# Patient Record
Sex: Female | Born: 1955 | Race: Black or African American | Hispanic: No | Smoking: Former smoker
Health system: Southern US, Community
[De-identification: ages and names within clinical notes are randomized; demographics above are authoritative.]

## PROBLEM LIST (undated history)

## (undated) DIAGNOSIS — E119 Type 2 diabetes mellitus without complications: Secondary | ICD-10-CM

## (undated) DIAGNOSIS — E785 Hyperlipidemia, unspecified: Secondary | ICD-10-CM

## (undated) DIAGNOSIS — H409 Unspecified glaucoma: Secondary | ICD-10-CM

## (undated) DIAGNOSIS — F329 Major depressive disorder, single episode, unspecified: Secondary | ICD-10-CM

## (undated) DIAGNOSIS — F419 Anxiety disorder, unspecified: Secondary | ICD-10-CM

## (undated) DIAGNOSIS — T7840XA Allergy, unspecified, initial encounter: Secondary | ICD-10-CM

## (undated) DIAGNOSIS — F32A Depression, unspecified: Secondary | ICD-10-CM

## (undated) DIAGNOSIS — K219 Gastro-esophageal reflux disease without esophagitis: Secondary | ICD-10-CM

## (undated) DIAGNOSIS — I10 Essential (primary) hypertension: Secondary | ICD-10-CM

## (undated) HISTORY — PX: ABDOMINAL HYSTERECTOMY: SHX81

## (undated) HISTORY — DX: Unspecified glaucoma: H40.9

## (undated) HISTORY — DX: Depression, unspecified: F32.A

## (undated) HISTORY — DX: Anxiety disorder, unspecified: F41.9

## (undated) HISTORY — DX: Allergy, unspecified, initial encounter: T78.40XA

## (undated) HISTORY — DX: Type 2 diabetes mellitus without complications: E11.9

## (undated) HISTORY — PX: WISDOM TOOTH EXTRACTION: SHX21

## (undated) HISTORY — DX: Major depressive disorder, single episode, unspecified: F32.9

## (undated) HISTORY — DX: Gastro-esophageal reflux disease without esophagitis: K21.9

## (undated) HISTORY — DX: Hyperlipidemia, unspecified: E78.5

## (undated) HISTORY — DX: Essential (primary) hypertension: I10

---

## 1998-02-16 ENCOUNTER — Ambulatory Visit (HOSPITAL_COMMUNITY): Admission: RE | Admit: 1998-02-16 | Discharge: 1998-02-16 | Payer: Self-pay | Admitting: Obstetrics and Gynecology

## 1999-03-01 ENCOUNTER — Ambulatory Visit (HOSPITAL_COMMUNITY): Admission: RE | Admit: 1999-03-01 | Discharge: 1999-03-01 | Payer: Self-pay | Admitting: Obstetrics and Gynecology

## 1999-03-01 ENCOUNTER — Encounter: Payer: Self-pay | Admitting: Obstetrics and Gynecology

## 1999-03-16 ENCOUNTER — Encounter: Payer: Self-pay | Admitting: Obstetrics and Gynecology

## 1999-03-16 ENCOUNTER — Ambulatory Visit (HOSPITAL_COMMUNITY): Admission: RE | Admit: 1999-03-16 | Discharge: 1999-03-16 | Payer: Self-pay | Admitting: Obstetrics and Gynecology

## 1999-09-04 ENCOUNTER — Ambulatory Visit (HOSPITAL_COMMUNITY): Admission: RE | Admit: 1999-09-04 | Discharge: 1999-09-04 | Payer: Self-pay | Admitting: Obstetrics and Gynecology

## 1999-09-04 ENCOUNTER — Encounter: Payer: Self-pay | Admitting: Obstetrics and Gynecology

## 1999-09-05 ENCOUNTER — Other Ambulatory Visit: Admission: RE | Admit: 1999-09-05 | Discharge: 1999-09-05 | Payer: Self-pay | Admitting: Obstetrics and Gynecology

## 2000-04-08 ENCOUNTER — Encounter: Payer: Self-pay | Admitting: Obstetrics and Gynecology

## 2000-04-08 ENCOUNTER — Encounter: Admission: RE | Admit: 2000-04-08 | Discharge: 2000-04-08 | Payer: Self-pay | Admitting: Internal Medicine

## 2000-09-06 ENCOUNTER — Other Ambulatory Visit: Admission: RE | Admit: 2000-09-06 | Discharge: 2000-09-06 | Payer: Self-pay | Admitting: Obstetrics and Gynecology

## 2001-04-16 ENCOUNTER — Ambulatory Visit (HOSPITAL_COMMUNITY): Admission: RE | Admit: 2001-04-16 | Discharge: 2001-04-16 | Payer: Self-pay | Admitting: Obstetrics and Gynecology

## 2001-04-16 ENCOUNTER — Encounter: Payer: Self-pay | Admitting: Obstetrics and Gynecology

## 2001-09-09 ENCOUNTER — Other Ambulatory Visit: Admission: RE | Admit: 2001-09-09 | Discharge: 2001-09-09 | Payer: Self-pay | Admitting: Obstetrics and Gynecology

## 2002-04-20 ENCOUNTER — Ambulatory Visit (HOSPITAL_COMMUNITY): Admission: RE | Admit: 2002-04-20 | Discharge: 2002-04-20 | Payer: Self-pay | Admitting: Obstetrics and Gynecology

## 2002-04-20 ENCOUNTER — Encounter: Payer: Self-pay | Admitting: Obstetrics and Gynecology

## 2003-04-22 ENCOUNTER — Ambulatory Visit (HOSPITAL_COMMUNITY): Admission: RE | Admit: 2003-04-22 | Discharge: 2003-04-22 | Payer: Self-pay | Admitting: Obstetrics and Gynecology

## 2003-04-22 ENCOUNTER — Encounter: Payer: Self-pay | Admitting: Obstetrics and Gynecology

## 2004-06-19 ENCOUNTER — Ambulatory Visit (HOSPITAL_COMMUNITY): Admission: RE | Admit: 2004-06-19 | Discharge: 2004-06-19 | Payer: Self-pay | Admitting: Obstetrics and Gynecology

## 2005-06-28 ENCOUNTER — Ambulatory Visit: Payer: Self-pay | Admitting: Internal Medicine

## 2005-07-09 ENCOUNTER — Ambulatory Visit: Payer: Self-pay | Admitting: Internal Medicine

## 2005-08-24 ENCOUNTER — Ambulatory Visit (HOSPITAL_COMMUNITY): Admission: RE | Admit: 2005-08-24 | Discharge: 2005-08-24 | Payer: Self-pay | Admitting: Obstetrics and Gynecology

## 2006-09-25 ENCOUNTER — Ambulatory Visit (HOSPITAL_COMMUNITY): Admission: RE | Admit: 2006-09-25 | Discharge: 2006-09-25 | Payer: Self-pay | Admitting: Internal Medicine

## 2007-10-14 ENCOUNTER — Ambulatory Visit (HOSPITAL_COMMUNITY): Admission: RE | Admit: 2007-10-14 | Discharge: 2007-10-14 | Payer: Self-pay | Admitting: Internal Medicine

## 2008-10-15 ENCOUNTER — Ambulatory Visit (HOSPITAL_COMMUNITY): Admission: RE | Admit: 2008-10-15 | Discharge: 2008-10-15 | Payer: Self-pay | Admitting: Obstetrics and Gynecology

## 2009-05-17 ENCOUNTER — Encounter: Payer: Self-pay | Admitting: Internal Medicine

## 2009-06-29 ENCOUNTER — Encounter: Payer: Self-pay | Admitting: Internal Medicine

## 2009-07-25 ENCOUNTER — Ambulatory Visit: Payer: Self-pay | Admitting: Internal Medicine

## 2009-07-25 DIAGNOSIS — I10 Essential (primary) hypertension: Secondary | ICD-10-CM | POA: Insufficient documentation

## 2009-07-25 DIAGNOSIS — E785 Hyperlipidemia, unspecified: Secondary | ICD-10-CM | POA: Insufficient documentation

## 2009-07-25 DIAGNOSIS — F17200 Nicotine dependence, unspecified, uncomplicated: Secondary | ICD-10-CM | POA: Insufficient documentation

## 2009-08-03 ENCOUNTER — Ambulatory Visit: Payer: Self-pay

## 2009-08-03 ENCOUNTER — Encounter: Payer: Self-pay | Admitting: Internal Medicine

## 2009-08-24 ENCOUNTER — Encounter: Payer: Self-pay | Admitting: Internal Medicine

## 2009-09-15 ENCOUNTER — Ambulatory Visit: Payer: Self-pay | Admitting: Internal Medicine

## 2009-10-24 ENCOUNTER — Ambulatory Visit (HOSPITAL_COMMUNITY): Admission: RE | Admit: 2009-10-24 | Discharge: 2009-10-24 | Payer: Self-pay | Admitting: Obstetrics and Gynecology

## 2010-01-17 ENCOUNTER — Encounter (INDEPENDENT_AMBULATORY_CARE_PROVIDER_SITE_OTHER): Payer: Self-pay | Admitting: *Deleted

## 2010-03-09 ENCOUNTER — Ambulatory Visit: Payer: Self-pay | Admitting: Internal Medicine

## 2010-04-26 ENCOUNTER — Encounter: Payer: Self-pay | Admitting: Nurse Practitioner

## 2010-06-23 ENCOUNTER — Encounter: Payer: Self-pay | Admitting: Nurse Practitioner

## 2010-06-23 ENCOUNTER — Telehealth: Payer: Self-pay | Admitting: Internal Medicine

## 2010-06-27 ENCOUNTER — Ambulatory Visit: Payer: Self-pay | Admitting: Gastroenterology

## 2010-06-27 ENCOUNTER — Encounter: Payer: Self-pay | Admitting: Internal Medicine

## 2010-06-27 DIAGNOSIS — K625 Hemorrhage of anus and rectum: Secondary | ICD-10-CM | POA: Insufficient documentation

## 2010-06-27 DIAGNOSIS — K5900 Constipation, unspecified: Secondary | ICD-10-CM | POA: Insufficient documentation

## 2010-06-27 DIAGNOSIS — K219 Gastro-esophageal reflux disease without esophagitis: Secondary | ICD-10-CM | POA: Insufficient documentation

## 2010-07-04 ENCOUNTER — Ambulatory Visit: Payer: Self-pay | Admitting: Internal Medicine

## 2010-11-27 ENCOUNTER — Ambulatory Visit (HOSPITAL_COMMUNITY)
Admission: RE | Admit: 2010-11-27 | Discharge: 2010-11-27 | Payer: Self-pay | Source: Home / Self Care | Admitting: Obstetrics and Gynecology

## 2011-01-23 NOTE — Assessment & Plan Note (Signed)
Summary: ANEMIA/BLOOD IN STOOL               (DR.BRODIE PT.)       DEB...   History of Present Illness Visit Type: Initial Consult Primary GI MD: Lina Sar MD Primary Provider: [facility] Requesting Provider: Dr Georgian Co [facility] Chief Complaint: anemia/blood in stool History of Present Illness:   Patient last seen by Dr. Juanda Chance in 2003 at which time she had a colonoscopy for hematochezia. Saw Urgent Care 06/23/10 with constipation and rectal bleeding. Given Miralax and Anusol but didn't pick those up from pharmacy yet. Spontaneously improved but then Sunday had more rectal bleeding (scant amount. No rectal bleeding on Monday (yesterday) and no BMs today.  Patient's constipation developed three weeks ago. She didn't start any new medicdaiton other than Zyrtec. No changes in activity. She is trying to lose weight and has made healthy dietary changes with more frutis and vegetables. Has intentionally lost 20 pounds over last 6 months.    GI Review of Systems    Reports acid reflux and  heartburn.      Denies abdominal pain, belching, bloating, chest pain, dysphagia with liquids, dysphagia with solids, loss of appetite, nausea, vomiting, vomiting blood, weight loss, and  weight gain.      Reports constipation, hemorrhoids, and  rectal bleeding.     Denies anal fissure, black tarry stools, change in bowel habit, diarrhea, diverticulosis, fecal incontinence, heme positive stool, irritable bowel syndrome, jaundice, light color stool, liver problems, and  rectal pain. Preventive Screening-Counseling & Management      Drug Use:  no.      Current Medications (verified): 1)  Aciphex 20 Mg Tbec (Rabeprazole Sodium) .... Take 1 Tablet By Mouth Once A Day 2)  Wellbutrin Xl 150 Mg Xr24h-Tab (Bupropion Hcl) .... Take 1 Tablet By Mouth Once A Day 3)  Crestor 10 Mg Tabs (Rosuvastatin Calcium) .... Take One Tablet By Mouth Daily. 4)   Lisinopril-Hydrochlorothiazide 20-12.5 Mg Tabs (Lisinopril-Hydrochlorothiazide) .Marland Kitchen.. 1 By Mouth Once Daily 5)  Metformin Hcl 500 Mg Tabs (Metformin Hcl) .Marland Kitchen.. 1 By Mouth Once Daily  Allergies (verified): 1)  ! * Bioxin  Past History:  Past Medical History: Hypertension Dyslipidemia Anxiety Disorder Diabetes GERD  Past Surgical History: Reviewed history from 07/23/2009 and no changes required. partial hysterectomy  Family History: family history of CROHN'S  diease                                                             mat-and -pat grandparents have heart diease                                     diabetes mat-grandmother and aunt                                                     alcoholism -uncle Family History of Breast Cancer:  Social History: divorced-past smoker- occ acholol-no drugs  . 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  The patient denies allergy/sinus, anemia, arthritis/joint pain, back pain, blood in urine, breast changes/lumps, change in vision, confusion, cough, coughing up blood, depression-new, fainting, fatigue, fever, headaches-new, hearing problems, heart murmur, heart rhythm changes, itching, menstrual pain, muscle pains/cramps, night sweats, nosebleeds, pregnancy symptoms, shortness of breath, skin rash, sleeping problems, sore throat, swelling of feet/legs, swollen lymph glands, thirst - excessive , urination - excessive , urination changes/pain, urine leakage, vision changes, and voice change.    Vital Signs:  Patient profile:   55 year old female Height:      65 inches Weight:      223 pounds BMI:     37.24 BSA:     2.07 Pulse rate:   88 / minute Pulse rhythm:   regular BP sitting:   102 / 62  (left arm)  Vitals Entered By: Merri Ray CMA Duncan Dull) (June 27, 2010 2:19 PM)  Physical Exam  General:  Well developed, pleasant black  female. Head:  Normocephalic and atraumatic. Eyes:  Conjunctiva pink, no icterus.  Neck:  no obvious masses  Lungs:  Clear throughout to auscultation. Heart:  Regular rate and rhythm; no murmurs, rubs,  or bruits. Abdomen:  Abdomen soft, nontender, nondistended. No obvious masses or hepatomegaly.Normal bowel sounds.  Rectal:  No external hemorrhoids or fissures. Small amount brown stools with flecks of blood, heme positive.  Msk:  Symmetrical with no gross deformities. Normal posture. Extremities:  No palmar erythema, no edema.  Neurologic:  Alert and  oriented x4;  grossly normal neurologically. Skin:  Intact without significant lesions or rashes. Cervical Nodes:  No significant cervical adenopathy. Psych:  Alert and cooperative. Normal mood and affect.   Impression & Recommendations:  Problem # 1:  RECTAL BLEEDING (ICD-569.3) Assessment New CBC from Urgent Care on 04/26/10 was normal with hemoglobin of 14.3, MCV of 89. BMET also normal. According to the notes from Urgent Care a repeat CBC on 06/23/10 showed hemoglobin of 14.2. In setting of acute constipation, bleeding likely secondary to known history of hemorrhoids but it has been 8 1/2 years since her last colonoscopy so polyps, neoplasm are possible. The patient will be scheduled for a colonoscopy with biopsies/polypectomy (if indicated).  The risks and benefits of the procedure, as well as alternatives were discussed with the patient and she agrees to proceed. In meantime patient should start Anusol HC suppositories as prescribed by Urgent Care.   Note: patient referred for anemia and rectal bleeding. Based on records from Urgent Care I see no evidence of anemia. We have left a message at their office to call for clarification.   Orders: Colonoscopy (Colon)  Problem # 2:  GERD (ICD-530.81) Assessment: Deteriorated Longstanding GERD with heartburn and regurgitation. Takes Aciphex after breakfast and having breakthrough symptoms.  Needs to take Aciphex 30 minutes prior to breakfast. Anti-reflux measures discussed.  Continued weight loss should help. If no improvement she may need to increase PPI twice daily.   Problem # 3:  CONSTIPATION (ICD-564.00) Assessment: New Acute. Recommend starting daily Miralax given to her by Urgent Care.   Patient Instructions: 1)  We sent the prescription for the Colonoscopy prep to the pharmacy, [facility], [address]. 2)  We scheduled the Colonsocopy for 07-04-10 with Dr. Juanda Chance.  3)  Directions and brochure given. 4)  [facility] Patient Information Guide given to patient. 5)  Continue the Miralax daily. 6)  Continue the Anusol Supopositories. 7)  Copy sent to : Dr. Georgian Co 8)  The medication list was reviewed and reconciled.  All changed / newly prescribed medications were explained.  A complete medication list was provided to the patient / caregiver. Prescriptions: MOVIPREP 100 GM  SOLR (PEG-KCL-NACL-NASULF-NA ASC-C) As per prep instructions.  #1 x 0   Entered by:   Lowry Ram NCMA   Authorized by:   Willette Cluster NP   Signed by:   Lowry Ram NCMA on 06/27/2010   Method used:   Electronically to        [facility] [address] [id number]* (retail)       [address] [place], [place]  [address]       Ph: [phone]       Fax: [phone]   RxID:   [id number]   Appended Document: ANEMIA/BLOOD IN STOOL               (DR.BRODIE PT.)       DEB... Marylene Land from [facility] called me back and said that she checked with the provider, Georgian Co and she did not suspect anemia.  The pt mentioned some blood in her stool.  The pt hasn't had any new labs done since the labs they sent [name]. The HGB had not changed from 14.2 , since labs done on 04-26-10. They did not do Hemosure stool card testing with this pt.

## 2011-01-23 NOTE — Progress Notes (Signed)
Summary: Triage  Phone Note From Other Clinic   Caller: Renee @ [facility]  [phone] Call For: Dr. Juanda Chance Summary of Call: pt. has blood in her stool and is anemic. Requesting sooner appt. than next avail. Initial call taken by: Karna Christmas,  June 23, 2010 9:58 AM  Follow-up for Phone Call        Pt. will see Willette Cluster NP on 06-27-10 at 2:30pm. Luster Landsberg will advise pt. of appt/med.list/co-pay/cx.policy and she will fax records to [phone] Attn: Pam.  Follow-up by: Laureen Ochs LPN,  June 24, 4539 10:38 AM     Appended Document: Triage Called on Tues 06-27-10 and Wed 06-28-10 to have [facility] fax [name] any info that states the pt is anemic and has Hem Pos Stools.  That was why the appt was made with [name] but when that office sent [name] paperwork, there was nothing to show this.  Willette Cluster NP asked me to call that office to see if there is any other labs or notes that show this.  I was told again today that they will look into this and get back to me.

## 2011-01-23 NOTE — Miscellaneous (Signed)
Summary: Anusol Rx  Clinical Lists Changes  Medications: Added new medication of ANUSOL-HC 25 MG  SUPP (HYDROCORTISONE ACETATE) insert 1 at bedtime as needed rectal bleeding - Signed Rx of ANUSOL-HC 25 MG  SUPP (HYDROCORTISONE ACETATE) insert 1 at bedtime as needed rectal bleeding;  #12 x 0;  Signed;  Entered by: Doristine Church RN II;  Authorized by: Hart Carwin MD;  Method used: Electronically to [id number]*, [address] [place], [place]  [address], Ph: [phone], Fax: [phone] Observations: Added new observation of ALLERGY REV: Done (07/04/2010 14:17)    Prescriptions: ANUSOL-HC 25 MG  SUPP (HYDROCORTISONE ACETATE) insert 1 at bedtime as needed rectal bleeding  #12 x 0   Entered by:   Doristine Church RN II   Authorized by:   Hart Carwin MD   Signed by:   Doristine Church RN II on 07/04/2010   Method used:   Electronically to        [id number]* (retail)       [address] [place], [place]  [address]       Ph: [phone]       Fax: [phone]   RxID:   [id number]

## 2011-01-23 NOTE — Assessment & Plan Note (Signed)
Summary: 6 month rov   Visit Type:  Follow-up Primary Provider:  [facility]  CC:  no complaints.  History of Present Illness: Patient is a 55 year old with a history of hypertension and dyslipidemia.  I saw her for the first time in Aug then once again in September. Since seen, she has done well.  She denies chest pain, breathing is good.  She is working at losing wt and has gone down about 20 lbs.  Denies dizziness.  Current Medications (verified): 1)  Aciphex 20 Mg Tbec (Rabeprazole Sodium) .... Take 1 Tablet By Mouth Once A Day 2)  Wellbutrin Xl 150 Mg Xr24h-Tab (Bupropion Hcl) .... Take 1 Tablet By Mouth Once A Day 3)  Crestor 10 Mg Tabs (Rosuvastatin Calcium) .... Take One Tablet By Mouth Daily. 4)  Lisinopril-Hydrochlorothiazide 20-25 Mg Tabs (Lisinopril-Hydrochlorothiazide) .... 2 Tabs Daily  Allergies (verified): 1)  ! * Bioxin  Past History:  Past medical, surgical, family and social histories (including risk factors) reviewed, and no changes noted (except as noted below).  Past Medical History: Reviewed history from 07/25/2009 and no changes required. Hypertension Dyslipidemia  Past Surgical History: Reviewed history from 07/23/2009 and no changes required. partial hysterectomy  Family History: Reviewed history from 07/23/2009 and no changes required. family history of chrons diease                                                             mat-and -pat grandparents have heart diease                                     diabetes mat-grandmother and aunt                                                     alcoholism -uncle  Social History: Reviewed history from 07/25/2009 and no changes required. divorced-smoker- occ acholol-no drugs  .  Vital Signs:  Patient profile:   55 year old female Height:      65 inches Weight:      219 pounds BMI:     36.58 Pulse rate:   91 / minute BP sitting:   107 / 75  (left arm) Cuff size:    large  Vitals Entered By: Burnett Kanaris, CNA (March 09, 2010 2:04 PM)  Physical Exam  Additional Exam:  Patient is in NAD HEENT:  Normocephalic, atraumatic. EOMI, PERRLA.  Neck: JVP is normal. No thyromegaly. No bruits.  Lungs: clear to auscultation. No rales no wheezes.  Heart: Regular rate and rhythm. Normal S1, S2. No S3.   No significant murmurs. PMI not displaced.  Abdomen:  Supple, nontender. Normal bowel sounds. No masses. No hepatomegaly.  Extremities:   Good distal pulses throughout. No lower extremity edema.  Musculoskeletal :moving all extremities.  Neuro:   alert and oriented x3.    Impression & Recommendations:  Problem # 1:  ESSENTIAL HYPERTENSION, BENIGN (ICD-401.1) Excellent control.  I would keep her on the same regimen. Her updated medication list for this problem includes:  Lisinopril-hydrochlorothiazide 20-25 Mg Tabs (Lisinopril-hydrochlorothiazide) .Marland Kitchen... 2 tabs daily  Orders: EKG w/ Interpretation (93000)  Problem # 2:  HYPERLIPIDEMIA-MIXED (ICD-272.4) Keep on same regimen.  Will get records from Dr. Quay Burow office re lipids. Congratulated on wt loss. Her updated medication list for this problem includes:    Crestor 10 Mg Tabs (Rosuvastatin calcium) .Marland Kitchen... Take one tablet by mouth daily.  Patient Instructions: 1)  We will see you back on an as needed basis.  Appended Document: 6 month rov 12 EKG:  NSR.  91 bpm.

## 2011-01-23 NOTE — Procedures (Signed)
Summary: Colonoscopy  Patient: Cheryl Morton Note: All result statuses are Final unless otherwise noted.  Tests: (1) Colonoscopy (COL)   COL Colonoscopy           DONE     [facility]     [address]     [place], [place]  [address]           COLONOSCOPY PROCEDURE REPORT           PATIENT:  Cheryl Morton, Cheryl Morton  MR#:  [id number]     BIRTHDATE:  Jan 15, 1956, 54 yrs. old  GENDER:  female     ENDOSCOPIST:  Hedwig Morton. Juanda Chance, MD     REF. BY:  Georgian Co, PA     PROCEDURE DATE:  07/04/2010     PROCEDURE:  Colonoscopy 19147     ASA CLASS:  Class I     INDICATIONS:  hematochezia last colonoscopy 2003 int hemorrhoids     MEDICATIONS:   Versed 10 mg, Fentanyl 100 mcg           DESCRIPTION OF PROCEDURE:   After the risks benefits and     alternatives of the procedure were thoroughly explained, informed     consent was obtained.  Digital rectal exam was performed and     revealed no rectal masses.   The LB [id number]-[id number] endoscope     was introduced through the anus and advanced to the cecum, which     was identified by both the appendix and ileocecal valve, without     limitations.  The quality of the prep was excellent, using     MoviPrep.  The instrument was then slowly withdrawn as the colon     was fully examined.     <<PROCEDUREIMAGES>>           FINDINGS:  Internal and external hemorrhoids were found (see     image7 and image6).  This was otherwise a normal examination of     the colon (see image5, image4, image3, image2, and image1).     Retroflexion was not performed.  The scope was then withdrawn from     the patient and the procedure completed.           COMPLICATIONS:  None     ENDOSCOPIC IMPRESSION:     1) Internal and external hemorrhoids     2) Otherwise normal examination     3) Normal colonoscopy     RECOMMENDATIONS:     1) high fiber diet     Anusol HC supp, #12, Insert 1 qhs prn rectal bleeding     REPEAT EXAM:  In 10 year(s) for.        ______________________________     Hedwig Morton. Juanda Chance, MD           CC:           n.     eSIGNED:   Hedwig Morton. Smaran Gaus at 07/04/2010 02:11 PM           Laverle, Pillard, [phone]  Note: An exclamation mark (!) indicates a result that was not dispersed into the flowsheet. Document Creation Date: 07/04/2010 2:12 PM _______________________________________________________________________  (1) Order result status: Final Collection or observation date-time: 07/04/2010 14:04 Requested date-time:  Receipt date-time:  Reported date-time:  Referring Physician:   Ordering Physician: Lina Sar [phone]) Specimen Source:  Source: Launa Grill Order Number: [id number] Lab site:

## 2011-01-23 NOTE — Letter (Signed)
Summary: Diabetic Instructions  [facility]  [address] [place], [place] [address]   Phone: [phone]  Fax: [phone]    Cheryl Morton [birth year] MRN: [id number]   X   ORAL DIABETIC MEDICATION INSTRUCTIONS  The day before your procedure:   Take your diabetic pill as you do normally  The day of your procedure:   Do not take your diabetic pill    We will check your blood sugar levels during the admission process and again in Recovery before discharging you home  ________________________________________________________________________  _  _   INSULIN (LONG ACTING) MEDICATION INSTRUCTIONS (Lantus, NPH, 70/30, Humulin, Novolin-N)   The day before your procedure:   Take  your regular evening dose    The day of your procedure:   Do not take your morning dose    _  _   INSULIN (SHORT ACTING) MEDICATION INSTRUCTIONS (Regular, Humulog, Novolog)   The day before your procedure:   Do not take your evening dose   The day of your procedure:   Do not take your morning dose   _  _   INSULIN PUMP MEDICATION INSTRUCTIONS  We will contact the physician managing your diabetic care for written dosage instructions for the day before your procedure and the day of your procedure.  Once we have received the instructions, we will contact you.

## 2011-01-23 NOTE — Procedures (Signed)
Summary: Colonoscopy   Procedures Next Due Date:    Colonoscopy: 06/2020 

## 2011-01-23 NOTE — Letter (Signed)
Summary: [facility]  [facility]   Imported By: Sherian Rein 06/30/2010 13:19:57  _____________________________________________________________________  External Attachment:    Type:   Image     Comment:   External Document

## 2011-01-23 NOTE — Letter (Signed)
Summary: [facility] Instructions  [facility]  [address] [place], [place] [address]   Phone: [phone]  Fax: [phone]       Cheryl Morton    12-17-1956    MRN: [id number]        Procedure Day /Date:07-04-10     Arrival Time:12:30 PM     Procedure Time:1:30 PM     Location of Procedure:                    X      [facility] (4th Floor)  PREPARATION FOR COLONOSCOPY WITH MOVIPREP   Starting 5 days prior to your procedure 06-29-10 do not eat nuts, seeds, popcorn, corn, beans, peas,  salads, or any raw vegetables.  Do not take any fiber supplements (e.g. Metamucil, Citrucel, and Benefiber).  THE DAY BEFORE YOUR PROCEDURE         DATE: 07-03-10  ONG:EXBMWU  1.  Drink clear liquids the entire day-NO SOLID FOOD  2.  Do not drink anything colored red or purple.  Avoid juices with pulp.  No orange juice.  3.  Drink at least 64 oz. (8 glasses) of fluid/clear liquids during the day to prevent dehydration and help the prep work efficiently.  CLEAR LIQUIDS INCLUDE: Water Jello Ice Popsicles Tea (sugar ok, no milk/cream) Powdered fruit flavored drinks Coffee (sugar ok, no milk/cream) Gatorade Juice: apple, white grape, white cranberry  Lemonade Clear bullion, consomm, broth Carbonated beverages (any kind) Strained chicken noodle soup Hard Candy                             4.  In the morning, mix first dose of MoviPrep solution:    Empty 1 Pouch A and 1 Pouch B into the disposable container    Add lukewarm drinking water to the top line of the container. Mix to dissolve    Refrigerate (mixed solution should be used within 24 hrs)  5.  Begin drinking the prep at 5:00 p.m. The MoviPrep container is divided by 4 marks.   Every 15 minutes drink the solution down to the next mark (approximately 8 oz) until the full liter is complete.   6.  Follow completed prep with 16 oz of clear liquid of your choice (Nothing red or purple).  Continue to drink clear  liquids until bedtime.  7.  Before going to bed, mix second dose of MoviPrep solution:    Empty 1 Pouch A and 1 Pouch B into the disposable container    Add lukewarm drinking water to the top line of the container. Mix to dissolve    Refrigerate  THE DAY OF YOUR PROCEDURE      DATE: 07-04-10 DAY: Tuesday  Beginning at 8:30 AM  (5 hours before procedure):         1. Every 15 minutes, drink the solution down to the next mark (approx 8 oz) until the full liter is complete.  2. Follow completed prep with 16 oz. of clear liquid of your choice.    3. You may drink clear liquids until 11;30 AM  (2 HOURS BEFORE PROCEDURE).   MEDICATION INSTRUCTIONS  Unless otherwise instructed, you should take regular prescription medications with a small sip of water   as early as possible the morning of your procedure.  Diabetic patients - see separate instructions.        OTHER INSTRUCTIONS  You will need  a responsible adult at least 54 years of age to accompany you and drive you home.   This person must remain in the waiting room during your procedure.  Wear loose fitting clothing that is easily removed.  Leave jewelry and other valuables at home.  However, you may wish to bring a book to read or  an iPod/MP3 player to listen to music as you wait for your procedure to start.  Remove all body piercing jewelry and leave at home.  Total time from sign-in until discharge is approximately 2-3 hours.  You should go home directly after your procedure and rest.  You can resume normal activities the  day after your procedure.  The day of your procedure you should not:   Drive   Make legal decisions   Operate machinery   Drink alcohol   Return to work  You will receive specific instructions about eating, activities and medications before you leave.    The above instructions have been reviewed and explained to me by   _______________________    I fully understand and can verbalize  these instructions _____________________________ Date _________

## 2011-01-23 NOTE — Letter (Signed)
Summary: Appointment - Reminder 2  [facility], Main Office  [address]   [place], [place] [address]   Phone: [phone]  Fax: [phone]     January 17, 2010 MRN: [id number]   Cheryl Morton [address] [place], [place]  [address]   Dear Ms. Hinton,  Our records indicate that it is time to schedule a follow-up appointment with Dr. Tenny Craw. It is very important that we reach you to schedule this appointment. We look forward to participating in your health care needs. Please contact us at the number listed above at your earliest convenience to schedule your appointment.  If you are unable to make an appointment at this time, give [name] a call so we can update our records.   Sincerely,    Migdalia Dk [facility] Scheduling Team

## 2011-11-28 ENCOUNTER — Encounter (INDEPENDENT_AMBULATORY_CARE_PROVIDER_SITE_OTHER): Payer: BC Managed Care – PPO | Admitting: Physician Assistant

## 2011-11-28 DIAGNOSIS — Z01419 Encounter for gynecological examination (general) (routine) without abnormal findings: Secondary | ICD-10-CM

## 2011-11-28 DIAGNOSIS — Z0000 Encounter for general adult medical examination without abnormal findings: Secondary | ICD-10-CM

## 2011-12-13 ENCOUNTER — Other Ambulatory Visit: Payer: Self-pay | Admitting: Physician Assistant

## 2011-12-13 DIAGNOSIS — Z1231 Encounter for screening mammogram for malignant neoplasm of breast: Secondary | ICD-10-CM

## 2012-01-15 ENCOUNTER — Ambulatory Visit (HOSPITAL_COMMUNITY)
Admission: RE | Admit: 2012-01-15 | Discharge: 2012-01-15 | Disposition: A | Discharge status: Home / Self Care | Payer: BC Managed Care – PPO | Source: Ambulatory Visit | Attending: Physician Assistant | Admitting: Physician Assistant

## 2012-01-15 DIAGNOSIS — Z1231 Encounter for screening mammogram for malignant neoplasm of breast: Secondary | ICD-10-CM

## 2012-02-04 ENCOUNTER — Ambulatory Visit (INDEPENDENT_AMBULATORY_CARE_PROVIDER_SITE_OTHER): Payer: BC Managed Care – PPO

## 2012-03-13 ENCOUNTER — Other Ambulatory Visit: Payer: Self-pay | Admitting: Family Medicine

## 2012-03-13 MED ORDER — ONETOUCH FINEPOINT LANCETS MISC: Status: DC

## 2012-05-07 ENCOUNTER — Ambulatory Visit: Payer: BC Managed Care – PPO | Admitting: Physician Assistant

## 2012-05-14 ENCOUNTER — Ambulatory Visit (INDEPENDENT_AMBULATORY_CARE_PROVIDER_SITE_OTHER): Payer: BC Managed Care – PPO | Admitting: Physician Assistant

## 2012-05-14 ENCOUNTER — Encounter: Payer: Self-pay | Admitting: Physician Assistant

## 2012-05-14 VITALS — BP 122/85 | HR 89 | Temp 98.1°F | Resp 16 | Ht 66.5 in | Wt 221.4 lb

## 2012-05-14 DIAGNOSIS — I10 Essential (primary) hypertension: Secondary | ICD-10-CM

## 2012-05-14 DIAGNOSIS — R4584 Anhedonia: Secondary | ICD-10-CM

## 2012-05-14 DIAGNOSIS — E119 Type 2 diabetes mellitus without complications: Secondary | ICD-10-CM

## 2012-05-14 DIAGNOSIS — E782 Mixed hyperlipidemia: Secondary | ICD-10-CM

## 2012-05-14 LAB — COMPREHENSIVE METABOLIC PANEL
ALT: 41 U/L — ABNORMAL HIGH (ref 0–35)
BUN: 17 mg/dL (ref 6–23)
Calcium: 9.9 mg/dL (ref 8.4–10.5)
Creat: 0.91 mg/dL (ref 0.50–1.10)
Glucose, Bld: 109 mg/dL — ABNORMAL HIGH (ref 70–99)
Potassium: 4.5 mEq/L (ref 3.5–5.3)
Sodium: 136 mEq/L (ref 135–145)
Total Protein: 7.1 g/dL (ref 6.0–8.3)

## 2012-05-14 LAB — LIPID PANEL
Cholesterol: 142 mg/dL (ref 0–200)
Total CHOL/HDL Ratio: 3 Ratio
VLDL: 26 mg/dL (ref 0–40)

## 2012-05-14 MED ORDER — LISINOPRIL 20 MG PO TABS: 20.0000 mg | ORAL_TABLET | Freq: Every day | ORAL | Status: DC

## 2012-05-14 MED ORDER — ALPRAZOLAM 0.5 MG PO TABS: 0.5000 mg | ORAL_TABLET | Freq: Two times a day (BID) | ORAL | Status: DC

## 2012-05-14 MED ORDER — BUPROPION HCL ER (XL) 300 MG PO TB24: 300.0000 mg | ORAL_TABLET | Freq: Every day | ORAL | Status: DC

## 2012-05-14 MED ORDER — METFORMIN HCL 500 MG PO TABS: 500.0000 mg | ORAL_TABLET | Freq: Two times a day (BID) | ORAL | Status: DC

## 2012-05-14 MED ORDER — FLUTICASONE FUROATE 27.5 MCG/SPRAY NA SUSP: 2.0000 | Freq: Every day | NASAL | Status: DC

## 2012-05-14 MED ORDER — RABEPRAZOLE SODIUM 20 MG PO TBEC: 20.0000 mg | DELAYED_RELEASE_TABLET | Freq: Every day | ORAL | Status: DC

## 2012-05-14 MED ORDER — ROSUVASTATIN CALCIUM 10 MG PO TABS: 10.0000 mg | ORAL_TABLET | Freq: Every day | ORAL | Status: DC

## 2012-05-14 NOTE — Progress Notes (Signed)
  Subjective:    Patient ID: Cheryl Morton, female    DOB: 17-Jun-1956, 56 y.o.   MRN: [id number]  HPI 56 yo AAF here for check up on DM, htn, increased cholesterol, and anxiety/depression.  She ate fruit this morning b/c she wasn't thinking about having bloodwork done this morning.  Her FBG have been averaging ~112.  She admits to having a hard time lately over the last few months w/ binging in the evenings and doesn't want to get out of bed on the weekends.  She doesn't know of anything specifically she might be depressed about.  She is lacking motivation.  Doesn't want to exercise.  She does attend a [employer] and is involved in planning events.  She feels supported by her church family.  No SI/HI  Review of Systems  All other systems reviewed and are negative.       Objective:   Physical Exam  Nursing note and vitals reviewed. Constitutional: She is oriented to person, place, and time. She appears well-developed and well-nourished.  HENT:  Head: Normocephalic and atraumatic.  Cardiovascular: Normal rate, regular rhythm and normal heart sounds.   Pulmonary/Chest: Effort normal and breath sounds normal.  Neurological: She is alert and oriented to person, place, and time.  Skin: Skin is warm and dry.        Assessment & Plan:  Anhedonia and binge eating-gave several resources which she expresses an openness to.  OA, [employer] (nutrition and exercise counseling) as well as massage,etc.  Encouraged self-care vs self-indulgence htn-stable Diabetes-checking labs.  Advised [facility] Diet plan Hyperlipidemia-checking labs. Anxiety/Depression-meds ok (usu 1/2 xanax daily), work on lifestyle modification as discussed and encouraged.

## 2012-05-24 ENCOUNTER — Other Ambulatory Visit: Payer: Self-pay | Admitting: Physician Assistant

## 2012-06-03 ENCOUNTER — Ambulatory Visit (INDEPENDENT_AMBULATORY_CARE_PROVIDER_SITE_OTHER): Payer: BC Managed Care – PPO | Admitting: Family Medicine

## 2012-06-03 VITALS — BP 115/77 | HR 111 | Temp 98.2°F | Resp 16 | Ht 66.0 in | Wt 218.0 lb

## 2012-06-03 DIAGNOSIS — B029 Zoster without complications: Secondary | ICD-10-CM

## 2012-06-03 MED ORDER — VALACYCLOVIR HCL 1 G PO TABS: 1000.0000 mg | ORAL_TABLET | Freq: Two times a day (BID) | ORAL | Status: AC

## 2012-06-03 NOTE — Progress Notes (Signed)
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 This began approximately 4 days ago. He rash is painful and pox-like.  Objective: Pox-like rash left forearm-clusters of vesicles-and similar rash in the superior border of the left scapula.  Assessment: Shingles without secondary infection  Plan: Valtrex I hand wrote out a prescription for Vicodin 5/500 #15 to be taken at night every 6 hours when necessary Patient Instructions  Shingles Shingles is caused by the same virus that causes chickenpox (varicella zoster virus or VZV). Shingles often occurs many years or decades after having chickenpox. That is why it is more common in adults older than 50 years. The virus reactivates and breaks out as an infection in a nerve root. SYMPTOMS   The initial feeling (sensations) may be pain. This pain is usually described as:   Burning.   Stabbing.   Throbbing.   Tingling in the nerve root.   A red rash will follow in a couple days. The rash may occur in any area of the body and is usually on one side (unilateral) of the body in a band or belt-like pattern. The rash usually starts out as very small blisters (vesicles). They will dry up after 7 to 10 days. This is not usually a significant problem except for the pain it causes.   Long-lasting (chronic) pain is more likely in an elderly person. It can last months to years. This condition is called postherpetic neuralgia.  Shingles can be an extremely severe infection in someone with AIDS, a weakened immune system, or with forms of leukemia. It can also be severe if you are taking transplant medicines or other medicines that weaken the immune system. TREATMENT  Your caregiver will often treat you with:  Antiviral drugs.   Anti-inflammatory drugs.   Pain medicines.  Bed rest is very important in preventing the pain associated with herpes zoster (postherpetic neuralgia). Application of  heat in the form of a hot water bottle or electric heating pad or gentle pressure with the hand is recommended to help with the pain or discomfort. PREVENTION  A varicella zoster vaccine is available to help protect against the virus. The Food and Drug Administration approved the varicella zoster vaccine for individuals 3 years of age and older. HOME CARE INSTRUCTIONS   Cool compresses to the area of rash may be helpful.   Only take over-the-counter or prescription medicines for pain, discomfort, or fever as directed by your caregiver.   Avoid contact with:   Babies.   Pregnant women.   Children with eczema.   Elderly people with transplants.   People with chronic illnesses, such as leukemia and AIDS.   If the area involved is on your face, you may receive a referral for follow-up to a specialist. It is very important to keep all follow-up appointments. This will help avoid eye complications, chronic pain, or disability.  SEEK IMMEDIATE MEDICAL CARE IF:   You develop any pain (headache) in the area of the face or eye. This must be followed carefully by your caregiver or ophthalmologist. An infection in part of your eye (cornea) can be very serious. It could lead to blindness.   You do not have pain relief from prescribed medicines.   Your redness or swelling spreads.   The area involved becomes very swollen and painful.   You have a fever.   You notice any red or painful lines extending away from the  affected area toward your heart (lymphangitis).   Your condition is worsening or has changed.  Document Released: 12/10/2005 Document Revised: 11/29/2011 Document Reviewed: 11/14/2009 [facility] Patient Information 2012 [place], [place].

## 2012-06-03 NOTE — Patient Instructions (Signed)

## 2012-06-21 ENCOUNTER — Other Ambulatory Visit: Payer: Self-pay | Admitting: Physician Assistant

## 2012-07-16 ENCOUNTER — Encounter: Payer: Self-pay | Admitting: Physician Assistant

## 2012-07-16 ENCOUNTER — Ambulatory Visit (INDEPENDENT_AMBULATORY_CARE_PROVIDER_SITE_OTHER): Payer: BC Managed Care – PPO | Admitting: Physician Assistant

## 2012-07-16 VITALS — BP 136/104 | HR 86 | Temp 98.1°F | Resp 16 | Ht 65.5 in | Wt 218.0 lb

## 2012-07-16 DIAGNOSIS — R4584 Anhedonia: Secondary | ICD-10-CM

## 2012-07-16 DIAGNOSIS — R6889 Other general symptoms and signs: Secondary | ICD-10-CM

## 2012-07-16 NOTE — Progress Notes (Signed)
  Subjective:    Patient ID: Cheryl Morton, female    DOB: 1956/06/11, 56 y.o.   MRN: [id number]  HPI here for a recheck on the anhedonia/depression we discussed at last OV.  She has been forcing herself to spend more time with friends and to exercise.  She has also gotten more involved in her church.  She is beginning to come out of the depression she had been going through.  The progress is slow, but it is getting better.    She works at [employer] center for [employer] and [employer] are currently being held in [employer]!  She hasn't taken her blood pressure medicine this morning.  Review of Systems not done     Objective:   Physical Exam no exam      Assessment & Plan:  Anhedonia/depression-improving.  Keep up the good work.  Consider OA for support. Take BP meds!  See me in September for DM, htn, hypercholesterolemia, sooner if needed.  Please fill any needed meds prior to that visit if she needs them.

## 2012-07-21 ENCOUNTER — Other Ambulatory Visit: Payer: Self-pay | Admitting: Physician Assistant

## 2012-09-24 ENCOUNTER — Encounter: Payer: Self-pay | Admitting: Physician Assistant

## 2012-09-24 ENCOUNTER — Ambulatory Visit (INDEPENDENT_AMBULATORY_CARE_PROVIDER_SITE_OTHER): Payer: BC Managed Care – PPO | Admitting: Physician Assistant

## 2012-09-24 VITALS — BP 124/76 | HR 79 | Temp 97.6°F | Resp 16 | Ht 65.5 in | Wt 219.0 lb

## 2012-09-24 DIAGNOSIS — E785 Hyperlipidemia, unspecified: Secondary | ICD-10-CM

## 2012-09-24 DIAGNOSIS — E669 Obesity, unspecified: Secondary | ICD-10-CM

## 2012-09-24 DIAGNOSIS — E119 Type 2 diabetes mellitus without complications: Secondary | ICD-10-CM

## 2012-09-24 DIAGNOSIS — I10 Essential (primary) hypertension: Secondary | ICD-10-CM

## 2012-09-24 LAB — COMPREHENSIVE METABOLIC PANEL
ALT: 43 U/L — ABNORMAL HIGH (ref 0–35)
AST: 26 U/L (ref 0–37)
Albumin: 4 g/dL (ref 3.5–5.2)
Alkaline Phosphatase: 122 U/L — ABNORMAL HIGH (ref 39–117)
BUN: 16 mg/dL (ref 6–23)
Potassium: 4.3 mEq/L (ref 3.5–5.3)
Sodium: 137 mEq/L (ref 135–145)
Total Protein: 6.7 g/dL (ref 6.0–8.3)

## 2012-09-24 LAB — POCT GLYCOSYLATED HEMOGLOBIN (HGB A1C): Hemoglobin A1C: 6.4

## 2012-09-24 LAB — GLUCOSE, POCT (MANUAL RESULT ENTRY): POC Glucose: 102 mg/dl — AB (ref 70–99)

## 2012-09-24 LAB — LIPID PANEL
HDL: 43 mg/dL (ref >39)
LDL Cholesterol: 49 mg/dL (ref 0–99)

## 2012-09-24 MED ORDER — LISINOPRIL-HYDROCHLOROTHIAZIDE 20-12.5 MG PO TABS: 2.0000 | ORAL_TABLET | Freq: Every day | ORAL | Status: DC

## 2012-09-24 MED ORDER — FLUTICASONE PROPIONATE 50 MCG/ACT NA SUSP: 2.0000 | Freq: Every day | NASAL | Status: DC

## 2012-09-24 MED ORDER — HYDROCORTISONE ACETATE 25 MG RE SUPP: 25.0000 mg | Freq: Two times a day (BID) | RECTAL | Status: DC

## 2012-09-24 MED ORDER — METFORMIN HCL 500 MG PO TABS: 500.0000 mg | ORAL_TABLET | Freq: Two times a day (BID) | ORAL | Status: DC

## 2012-09-24 MED ORDER — LISINOPRIL 20 MG PO TABS: 20.0000 mg | ORAL_TABLET | Freq: Every day | ORAL | Status: DC

## 2012-09-24 MED ORDER — ALPRAZOLAM 0.5 MG PO TABS: 0.5000 mg | ORAL_TABLET | Freq: Two times a day (BID) | ORAL | Status: DC

## 2012-09-24 NOTE — Progress Notes (Signed)
  Subjective:    Patient ID: Cheryl Morton, female    DOB: 02-Nov-1956, 56 y.o.   MRN: [id number]  HPI [name] is here today for a recheck on DM, htn, and hyperlipidemia.  She had experienced some depression and anhedonia earlier this year, but this has subsided and she is feeling back to normal.  Review of Systems  All other systems reviewed and are negative.       Objective:   Physical Exam  Nursing note and vitals reviewed. Constitutional: She is oriented to person, place, and time. She appears well-developed and well-nourished.  HENT:  Head: Normocephalic and atraumatic.  Cardiovascular: Normal rate, regular rhythm and normal heart sounds.   Pulmonary/Chest: Effort normal and breath sounds normal.  Neurological: She is alert and oriented to person, place, and time.  Skin: Skin is warm and dry.  Psychiatric: She has a normal mood and affect. Her behavior is normal. Judgment and thought content normal.    Results for orders placed in visit on 09/24/12  GLUCOSE, POCT (MANUAL RESULT ENTRY)      Component Value Range   POC Glucose 102 (*) 70 - 99 mg/dl  POCT GLYCOSYLATED HEMOGLOBIN (HGB A1C)      Component Value Range   Hemoglobin A1C 6.4            Assessment & Plan:  DM-suboptimal control.  We discussed this at length.  She agrees to resume exercising in 1 week and to work harder on healthier diet.  She really wants to avoid having to increase the doses on her meds.  Hypertension-controlled.  Continue current meds. Hyperlipidemia-checking labs. Patient is reliably consistent with proper use on her xanax and she can have refills every 6 weeks for the next 6 months.  She will see me again in 3-4 months.

## 2012-09-25 LAB — MICROALBUMIN, URINE: Microalb, Ur: 0.74 mg/dL (ref 0.00–1.89)

## 2012-11-26 ENCOUNTER — Other Ambulatory Visit: Payer: Self-pay | Admitting: Physician Assistant

## 2012-11-27 NOTE — Telephone Encounter (Signed)
Please pull chart.

## 2012-11-27 NOTE — Telephone Encounter (Signed)
CHART PULLED AT NURSES STATION PA POOL [id number]

## 2012-11-28 ENCOUNTER — Other Ambulatory Visit: Payer: Self-pay | Admitting: Radiology

## 2012-12-27 ENCOUNTER — Other Ambulatory Visit: Payer: Self-pay | Admitting: Physician Assistant

## 2012-12-28 ENCOUNTER — Other Ambulatory Visit: Payer: Self-pay | Admitting: Physician Assistant

## 2012-12-31 ENCOUNTER — Other Ambulatory Visit: Payer: Self-pay | Admitting: Physician Assistant

## 2013-01-14 ENCOUNTER — Ambulatory Visit: Payer: BC Managed Care – PPO | Admitting: Physician Assistant

## 2013-01-21 ENCOUNTER — Ambulatory Visit: Payer: BC Managed Care – PPO | Admitting: Physician Assistant

## 2013-01-23 ENCOUNTER — Other Ambulatory Visit: Payer: Self-pay | Admitting: Physician Assistant

## 2013-01-25 ENCOUNTER — Other Ambulatory Visit: Payer: Self-pay | Admitting: *Deleted

## 2013-01-25 MED ORDER — ROSUVASTATIN CALCIUM 10 MG PO TABS: 10.0000 mg | ORAL_TABLET | Freq: Every day | ORAL | Status: DC

## 2013-01-26 ENCOUNTER — Telehealth: Payer: Self-pay

## 2013-01-26 NOTE — Telephone Encounter (Signed)
LMOM at H and cell #s to CB. I am trying to do a prior auth for Crestor 10 mg which pt has been on since 2008. Ask pt if she has ever been on a different chol med.  Pt called right back and reported that she has never been Rxd anything else for chol.  Completed form and faxed to [facility].

## 2013-01-29 NOTE — Telephone Encounter (Signed)
Received approval from 01/26/13 - 10/22/15 and faxed notice to pharmacy. LMOM to notify pt.

## 2013-02-02 ENCOUNTER — Other Ambulatory Visit: Payer: Self-pay | Admitting: Physician Assistant

## 2013-02-11 ENCOUNTER — Encounter: Payer: Self-pay | Admitting: Physician Assistant

## 2013-02-11 ENCOUNTER — Ambulatory Visit (INDEPENDENT_AMBULATORY_CARE_PROVIDER_SITE_OTHER): Payer: BC Managed Care – PPO | Admitting: Physician Assistant

## 2013-02-11 VITALS — BP 118/86 | HR 88 | Temp 98.6°F | Resp 16 | Ht 65.75 in | Wt 222.0 lb

## 2013-02-11 DIAGNOSIS — E1165 Type 2 diabetes mellitus with hyperglycemia: Secondary | ICD-10-CM

## 2013-02-11 DIAGNOSIS — E7800 Pure hypercholesterolemia, unspecified: Secondary | ICD-10-CM

## 2013-02-11 DIAGNOSIS — I10 Essential (primary) hypertension: Secondary | ICD-10-CM

## 2013-02-11 DIAGNOSIS — F411 Generalized anxiety disorder: Secondary | ICD-10-CM

## 2013-02-11 LAB — COMPREHENSIVE METABOLIC PANEL
ALT: 32 U/L (ref 0–35)
Albumin: 4.5 g/dL (ref 3.5–5.2)
Alkaline Phosphatase: 124 U/L — ABNORMAL HIGH (ref 39–117)
CO2: 29 mEq/L (ref 19–32)
Glucose, Bld: 92 mg/dL (ref 70–99)
Potassium: 4.1 mEq/L (ref 3.5–5.3)
Sodium: 136 mEq/L (ref 135–145)
Total Protein: 7 g/dL (ref 6.0–8.3)

## 2013-02-11 LAB — LIPID PANEL
LDL Cholesterol: 80 mg/dL (ref 0–99)
Triglycerides: 142 mg/dL (ref <150)

## 2013-02-11 LAB — POCT GLYCOSYLATED HEMOGLOBIN (HGB A1C): Hemoglobin A1C: 6.4

## 2013-02-11 MED ORDER — METFORMIN HCL 500 MG PO TABS: 500.0000 mg | ORAL_TABLET | Freq: Two times a day (BID) | ORAL | Status: DC

## 2013-02-11 MED ORDER — RABEPRAZOLE SODIUM 20 MG PO TBEC: DELAYED_RELEASE_TABLET | ORAL | Status: DC

## 2013-02-11 MED ORDER — GLUCOSE BLOOD VI STRP: ORAL_STRIP | Status: DC

## 2013-02-11 MED ORDER — ONETOUCH ULTRASOFT LANCETS MISC: Status: DC

## 2013-02-11 MED ORDER — LISINOPRIL 20 MG PO TABS: 20.0000 mg | ORAL_TABLET | Freq: Every day | ORAL | Status: DC

## 2013-02-11 MED ORDER — ALPRAZOLAM 0.5 MG PO TABS: ORAL_TABLET | ORAL | Status: DC

## 2013-02-11 MED ORDER — BUPROPION HCL ER (XL) 300 MG PO TB24: 300.0000 mg | ORAL_TABLET | ORAL | Status: DC

## 2013-02-11 MED ORDER — LISINOPRIL-HYDROCHLOROTHIAZIDE 20-12.5 MG PO TABS: 2.0000 | ORAL_TABLET | Freq: Every day | ORAL | Status: DC

## 2013-02-11 MED ORDER — ROSUVASTATIN CALCIUM 10 MG PO TABS: 10.0000 mg | ORAL_TABLET | Freq: Every day | ORAL | Status: DC

## 2013-02-11 MED ORDER — HYDROCORTISONE ACETATE 25 MG RE SUPP: 25.0000 mg | Freq: Two times a day (BID) | RECTAL | Status: DC

## 2013-02-13 NOTE — Progress Notes (Signed)
Subjective:    Patient ID: Cheryl Morton, female    DOB: 1955-12-30, 57 y.o.   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  She is doing well and is stable/compliant with her medications. Occasional blood on TP with BMs.  This is usu only after travelling or schedule/diet changes with work.  She is UTD on colonoscopy <2 yrs ago and normal.  The suppositories have worked well when this occurs.  It is not often.  Review of Systems  All other systems reviewed and are negative.       Objective:   Physical Exam  Nursing note and vitals reviewed. Constitutional: She is oriented to person, place, and time. She appears well-developed and well-nourished.  HENT:  Head: Normocephalic and atraumatic.  Neck: Normal range of motion. Neck supple.  Cardiovascular: Normal rate, regular rhythm and normal heart sounds.   Pulmonary/Chest: Effort normal and breath sounds normal.  Neurological: She is alert and oriented to person, place, and time.  Skin: Skin is warm and dry.  Psychiatric: She has a normal mood and affect. Her behavior is normal.    Results for orders placed in visit on 02/11/13  COMPREHENSIVE METABOLIC PANEL      Result Value Range   Sodium 136  135 - 145 mEq/L   Potassium 4.1  3.5 - 5.3 mEq/L   Chloride 100  96 - 112 mEq/L   CO2 29  19 - 32 mEq/L   Glucose, Bld 92  70 - 99 mg/dL   BUN 15  6 - 23 mg/dL   Creat 1.47  8.29 - 5.62 mg/dL   Total Bilirubin 0.5  0.3 - 1.2 mg/dL   Alkaline Phosphatase 124 (*) 39 - 117 U/L   AST 21  0 - 37 U/L   ALT 32  0 - 35 U/L   Total Protein 7.0  6.0 - 8.3 g/dL   Albumin 4.5  3.5 - 5.2 g/dL   Calcium 13.0  8.4 - 86.5 mg/dL  LIPID PANEL      Result Value Range   Cholesterol 155  0 - 200 mg/dL   Triglycerides 784  <696 mg/dL   HDL 47  >29 mg/dL   Total CHOL/HDL Ratio 3.3     VLDL 28  0 - 40 mg/dL   LDL Cholesterol 80  0 - 99 mg/dL  POCT GLYCOSYLATED HEMOGLOBIN (HGB A1C)       Result Value Range   Hemoglobin A1C 6.4           Assessment & Plan:  DM-suboptimal control with goal A1C of <6.0.  She will increase dietary changes/exercise/weight loss efforts. htn-stable Hyperlipidemia  Anxiety All stable.  Continue current meds.  See me in 6 month.  Ok to fill xanax in 3 months. See me in 6 months. Meds ordered this encounter  Medications  . ALPRAZolam (XANAX) 0.5 MG tablet    Sig: TAKE 1 TABLET BY MOUTH TWICE DAILY    Dispense:  180 tablet    Refill:  0    Order Specific Question:  Supervising Provider    Answer:  DOOLITTLE, ROBERT P [[id number]]  . buPROPion (WELLBUTRIN XL) 300 MG 24 hr tablet    Sig: Take 1 tablet (300 mg total) by mouth every morning.    Dispense:  90 tablet    Refill:  2    Order Specific Question:  Supervising Provider    Answer:  DOOLITTLE, ROBERT P [[id number]]  . hydrocortisone (ANUCORT-HC) 25 MG suppository    Sig: Place 1 suppository (25 mg total) rectally 2 (two) times daily.    Dispense:  24 suppository    Refill:  3    Order Specific Question:  Supervising Provider    Answer:  DOOLITTLE, ROBERT P [[id number]]  . rosuvastatin (CRESTOR) 10 MG tablet    Sig: Take 1 tablet (10 mg total) by mouth daily. Need office visit    Dispense:  30 tablet    Refill:  0    Order Specific Question:  Supervising Provider    Answer:  DOOLITTLE, ROBERT P [[id number]]  . RABEprazole (ACIPHEX) 20 MG tablet    Sig: Take 2 tabs daily    Dispense:  180 tablet    Refill:  3    Order Specific Question:  Supervising Provider    Answer:  DOOLITTLE, ROBERT P [[id number]]  . glucose blood (ONE TOUCH ULTRA TEST) test strip    Sig: USE AS DIRECTED    Dispense:  100 each    Refill:  0    Order Specific Question:  Supervising Provider    Answer:  DOOLITTLE, ROBERT P [[id number]]  . metFORMIN (GLUCOPHAGE) 500 MG tablet    Sig: Take 1 tablet (500 mg total) by mouth 2 (two) times daily with a meal.    Dispense:  180 tablet    Refill:  1    Order Specific Question:  Supervising  Provider    Answer:  DOOLITTLE, ROBERT P [[id number]]  . lisinopril-hydrochlorothiazide (PRINZIDE,ZESTORETIC) 20-12.5 MG per tablet    Sig: Take 2 tablets by mouth daily.    Dispense:  90 tablet    Refill:  1    Order Specific Question:  Supervising Provider    Answer:  DOOLITTLE, ROBERT P [[id number]]  . lisinopril (PRINIVIL,ZESTRIL) 20 MG tablet    Sig: Take 1 tablet (20 mg total) by mouth daily.    Dispense:  90 tablet    Refill:  2    Order Specific Question:  Supervising Provider    Answer:  DOOLITTLE, ROBERT P [[id number]]  . Lancets (ONETOUCH ULTRASOFT) lancets    Sig: USE AS DIRECTED THREE TIMES PER WEEK    Dispense:  100 each    Refill:  0    Order Specific Question:  Supervising Provider    Answer:  DOOLITTLE, ROBERT P [[id number]]

## 2013-03-19 ENCOUNTER — Telehealth: Payer: Self-pay

## 2013-03-19 MED ORDER — LISINOPRIL-HYDROCHLOROTHIAZIDE 20-12.5 MG PO TABS: 2.0000 | ORAL_TABLET | Freq: Every day | ORAL | Status: DC

## 2013-03-19 NOTE — Telephone Encounter (Signed)
This is corrected, incorrect Rx taken out of list. Cheryl Morton

## 2013-03-19 NOTE — Telephone Encounter (Signed)
Both are on her drugs list .  Please send in a 6 month supply of which ever one she has been taking.

## 2013-03-19 NOTE — Telephone Encounter (Signed)
PT PICKED UP RX AND IT WAS FOR LISINOPRIL 20MG . TAKE 1 TAB PO QD.   SHE NORMALLY TAKES LISINOPRIL-HCTZ 20/12.5  2 TABS PO QD. PLEASE CLARIFY WHAT SHE SHOULD BE TAKING. (SHE DID NOT THINK THAT THE RX SHOULD BE CHANGED.    THANKS  [facility] ON [address]

## 2013-03-20 ENCOUNTER — Other Ambulatory Visit: Payer: Self-pay | Admitting: Physician Assistant

## 2013-03-23 ENCOUNTER — Other Ambulatory Visit: Payer: Self-pay | Admitting: Physician Assistant

## 2013-03-23 DIAGNOSIS — Z1231 Encounter for screening mammogram for malignant neoplasm of breast: Secondary | ICD-10-CM

## 2013-03-30 ENCOUNTER — Ambulatory Visit (HOSPITAL_COMMUNITY)
Admission: RE | Admit: 2013-03-30 | Discharge: 2013-03-30 | Disposition: A | Discharge status: Home / Self Care | Payer: BC Managed Care – PPO | Source: Ambulatory Visit | Attending: Physician Assistant | Admitting: Physician Assistant

## 2013-03-30 DIAGNOSIS — Z1231 Encounter for screening mammogram for malignant neoplasm of breast: Secondary | ICD-10-CM

## 2013-04-11 ENCOUNTER — Other Ambulatory Visit: Payer: Self-pay | Admitting: Physician Assistant

## 2013-07-08 ENCOUNTER — Other Ambulatory Visit: Payer: Self-pay | Admitting: Physician Assistant

## 2013-07-22 ENCOUNTER — Other Ambulatory Visit: Payer: Self-pay | Admitting: Physician Assistant

## 2013-08-06 ENCOUNTER — Other Ambulatory Visit: Payer: Self-pay | Admitting: Physician Assistant

## 2013-08-12 ENCOUNTER — Ambulatory Visit: Payer: BC Managed Care – PPO | Admitting: Physician Assistant

## 2013-08-18 ENCOUNTER — Ambulatory Visit (INDEPENDENT_AMBULATORY_CARE_PROVIDER_SITE_OTHER): Payer: BC Managed Care – PPO | Admitting: Family Medicine

## 2013-08-18 ENCOUNTER — Encounter: Payer: Self-pay | Admitting: Family Medicine

## 2013-08-18 VITALS — BP 116/74 | HR 71 | Temp 98.7°F | Resp 16 | Ht 65.5 in | Wt 222.0 lb

## 2013-08-18 DIAGNOSIS — E6609 Other obesity due to excess calories: Secondary | ICD-10-CM | POA: Insufficient documentation

## 2013-08-18 DIAGNOSIS — E669 Obesity, unspecified: Secondary | ICD-10-CM

## 2013-08-18 DIAGNOSIS — I10 Essential (primary) hypertension: Secondary | ICD-10-CM

## 2013-08-18 DIAGNOSIS — Z6834 Body mass index (BMI) 34.0-34.9, adult: Secondary | ICD-10-CM | POA: Insufficient documentation

## 2013-08-18 DIAGNOSIS — E119 Type 2 diabetes mellitus without complications: Secondary | ICD-10-CM

## 2013-08-18 DIAGNOSIS — K219 Gastro-esophageal reflux disease without esophagitis: Secondary | ICD-10-CM

## 2013-08-18 LAB — POCT GLYCOSYLATED HEMOGLOBIN (HGB A1C): Hemoglobin A1C: 6.3

## 2013-08-18 MED ORDER — ROSUVASTATIN CALCIUM 10 MG PO TABS: 10.0000 mg | ORAL_TABLET | Freq: Every day | ORAL | Status: DC

## 2013-08-18 MED ORDER — RABEPRAZOLE SODIUM 20 MG PO TBEC: DELAYED_RELEASE_TABLET | ORAL | Status: DC

## 2013-08-18 MED ORDER — BUPROPION HCL ER (XL) 300 MG PO TB24: ORAL_TABLET | ORAL | Status: DC

## 2013-08-18 MED ORDER — METFORMIN HCL 500 MG PO TABS: ORAL_TABLET | ORAL | Status: DC

## 2013-08-18 MED ORDER — ALPRAZOLAM 0.5 MG PO TABS: 0.5000 mg | ORAL_TABLET | Freq: Two times a day (BID) | ORAL | Status: DC | PRN

## 2013-08-18 NOTE — Progress Notes (Signed)
S:  This 57 y.o. AA female is here for follow-up : Type II DM, HTN, GERD, and medication refills. She has a job that involves a lot of travel and [employer]. She takes her work-out clothing w/ her but rarely exercises. FSBS= 106-125. She denies hypoglycemia, fatigue, diaphoresis, abnormal weight changes, vision disturbances, CP or tightness, palpitations, SOB or DOE, cough, GI upset, myalgias, HA, dizziness, numbness, weakness or syncope.  HCM- CRS was performed ~ 2 years ago and pt reports she had to have a repeat colonoscopy due to persistent blood in stools. CRS revealed hemorrhoids; she has problems w/ chronic constipation. She takes PPI but generic is not effective; she requests Brand AcipHex.  Patient Active Problem List   Diagnosis Date Noted  . Obesity, unspecified 08/18/2013  . Diabetes mellitus 09/24/2012  . GERD 06/27/2010  . CONSTIPATION 06/27/2010  . HYPERLIPIDEMIA-MIXED 07/25/2009  . TOBACCO ABUSE 07/25/2009  . ESSENTIAL HYPERTENSION, BENIGN 07/25/2009   PMHx, Soc hx and Fam Hx reviewed. Medications reconciled.  ROS: As per HPI. Otherwise noncontributory.  O: Filed Vitals:   08/18/13 1321  BP: 116/74  Pulse: 71  Temp: 98.7 F (37.1 C)  Resp: 16   GEN: In NAD; WN,WD. HENT: [place]/AT; EOMI w/ clear conj/sclerae. Otherwise unremarkable. COR: RRR. Normal S1, S2. PMI nondisplaced. No m/g/r. LUNGS: Normal  resp rate and effort. SKIN: W&D; intact w/o rashes or lesions. MS: MAEs; no c/c/e. NEURO: A&O x 3; CNs intact. Nonfocal.  Results for orders placed in visit on 08/18/13  POCT GLYCOSYLATED HEMOGLOBIN (HGB A1C)      Result Value Range   Hemoglobin A1C 6.3      A/P: Type II or unspecified type diabetes mellitus without mention of complication, not stated as uncontrolled - No medication changes; encouraged increased physical activity. Plan: POCT glycosylated hemoglobin (Hb A1C)  Essential hypertension, benign- Stable and controlled; no medication changes.  GERD-  Brand AcipHex ordered.  Obesity, unspecified- Advised reduced caloric intake; increase physical activity by starting w/ 10-15 minutes of activity daily. Pt agrees.

## 2013-08-18 NOTE — Patient Instructions (Addendum)

## 2013-09-17 ENCOUNTER — Other Ambulatory Visit: Payer: Self-pay | Admitting: Physician Assistant

## 2013-09-24 ENCOUNTER — Ambulatory Visit (INDEPENDENT_AMBULATORY_CARE_PROVIDER_SITE_OTHER): Payer: BC Managed Care – PPO | Admitting: Physician Assistant

## 2013-09-24 VITALS — BP 102/72 | HR 100 | Temp 98.7°F | Resp 20 | Ht 65.5 in | Wt 225.2 lb

## 2013-09-24 DIAGNOSIS — J329 Chronic sinusitis, unspecified: Secondary | ICD-10-CM

## 2013-09-24 MED ORDER — FLUCONAZOLE 150 MG PO TABS: 150.0000 mg | ORAL_TABLET | Freq: Once | ORAL | Status: DC

## 2013-09-24 MED ORDER — AMOXICILLIN-POT CLAVULANATE 875-125 MG PO TABS: 1.0000 | ORAL_TABLET | Freq: Two times a day (BID) | ORAL | Status: DC

## 2013-09-24 MED ORDER — IPRATROPIUM BROMIDE 0.03 % NA SOLN: 2.0000 | Freq: Two times a day (BID) | NASAL | Status: DC

## 2013-09-24 MED ORDER — GUAIFENESIN ER 1200 MG PO TB12: 1.0000 | ORAL_TABLET | Freq: Two times a day (BID) | ORAL | Status: DC | PRN

## 2013-09-24 NOTE — Patient Instructions (Addendum)
Drink plenty of fluids and get some rest. Take antibiotic (augmentin) for sinus infection. Use Atrovent nasal spray and Mucinex to help with sinus pain and pressure.  IF you develop symptoms of a vaginal yeast infection, fill the prescription for fluconazole (Diflucan), which will be on file for you at the pharmacy.

## 2013-09-24 NOTE — Progress Notes (Signed)
I have examined this patient along with the student and agree.  

## 2013-09-24 NOTE — Progress Notes (Signed)
  Subjective:    Patient ID: Cheryl Morton, female    DOB: 05/31/1956, 57 y.o.   MRN: [id number]  HPI  Cheryl Morton is 57 year old female who is here today for sinus and gum pain, ear pain, and congestion. Her symptoms started about a week ago with sinus pain and pressure. She has had bad headaches as well. Her gums are also tender and swollen. A few days ago, she developed left ear pain but no ear drainage or loss of hearing. Her symptoms are getting progressively worse despite using ibuprofen, sudafed, and Allegra. She endorses nasal congestion, some post-nasal drip, and fatigue. She denies fever, chills, cough, sore throat, chest tightness, or SOB. No nausea or vomiting, constipation, or diarrhea.   Review of Systems As above    Objective:   Physical Exam  Constitutional: She is oriented to person, place, and time. She appears well-developed and well-nourished.  HENT:  Head: Normocephalic and atraumatic.  Right Ear: Hearing, tympanic membrane, external ear and ear canal normal.  Left Ear: Hearing, tympanic membrane and ear canal normal. There is tenderness (some tenderness over tragus). No drainage or swelling. No foreign bodies.  Nose: Rhinorrhea and sinus tenderness (over bridge of nose) present. Right sinus exhibits maxillary sinus tenderness and frontal sinus tenderness. Left sinus exhibits maxillary sinus tenderness and frontal sinus tenderness.  Mouth/Throat: Uvula is midline and mucous membranes are normal. Posterior oropharyngeal erythema present. No oropharyngeal exudate, posterior oropharyngeal edema or tonsillar abscesses.  Eyes: Conjunctivae are normal. Right eye exhibits no discharge. Left eye exhibits no discharge.  Cardiovascular: Normal rate, regular rhythm and normal heart sounds.   Pulmonary/Chest: Effort normal and breath sounds normal.  Lymphadenopathy:    She has no cervical adenopathy.  Neurological: She is alert and oriented to person, place, and time.  Skin: Skin is  warm and dry. She is not diaphoretic.  Psychiatric: She has a normal mood and affect.     BP 102/72  Pulse 100  Temp(Src) 98.7 F (37.1 C) (Oral)  Resp 20  Ht 5' 5.5" (1.664 m)  Wt 225 lb 3.2 oz (102.15 kg)  BMI 36.89 kg/m2  SpO2 98%     Assessment & Plan:   Sinusitis - Plan: ipratropium (ATROVENT) 0.03 % nasal spray, Guaifenesin (MUCINEX MAXIMUM STRENGTH) 1200 MG TB12, amoxicillin-clavulanate (AUGMENTIN) 875-125 MG per tablet, fluconazole (DIFLUCAN) 150 MG tablet  Drink plenty of fluids and get some rest. Take Augmentin for the sinus infection. Use Atrovent and Mucinex Max Strength to relieve sinus pressure and congestion. If you develop yeast infection symptoms (vaginal discharge, itching), call pharmacy to fill Diflucan. If symptoms do not resolve, come back to see [name].

## 2013-10-29 ENCOUNTER — Other Ambulatory Visit: Payer: Self-pay

## 2013-10-29 ENCOUNTER — Encounter: Payer: Self-pay | Admitting: *Deleted

## 2014-01-25 ENCOUNTER — Other Ambulatory Visit: Payer: Self-pay | Admitting: Family Medicine

## 2014-01-27 NOTE — Telephone Encounter (Signed)
Alprazolam refill phoned to pt's pharmacy. 

## 2014-02-17 ENCOUNTER — Ambulatory Visit (INDEPENDENT_AMBULATORY_CARE_PROVIDER_SITE_OTHER): Payer: BC Managed Care – PPO | Admitting: Family Medicine

## 2014-02-17 ENCOUNTER — Encounter: Payer: Self-pay | Admitting: Family Medicine

## 2014-02-17 VITALS — BP 119/83 | HR 78 | Temp 98.6°F | Resp 16 | Ht 65.5 in | Wt 219.0 lb

## 2014-02-17 DIAGNOSIS — Z23 Encounter for immunization: Secondary | ICD-10-CM

## 2014-02-17 DIAGNOSIS — Z0000 Encounter for general adult medical examination without abnormal findings: Secondary | ICD-10-CM

## 2014-02-17 DIAGNOSIS — E119 Type 2 diabetes mellitus without complications: Secondary | ICD-10-CM

## 2014-02-17 DIAGNOSIS — I10 Essential (primary) hypertension: Secondary | ICD-10-CM

## 2014-02-17 DIAGNOSIS — E785 Hyperlipidemia, unspecified: Secondary | ICD-10-CM

## 2014-02-17 LAB — POCT URINALYSIS DIPSTICK
Bilirubin, UA: NEGATIVE
GLUCOSE UA: NEGATIVE
KETONES UA: NEGATIVE
LEUKOCYTES UA: NEGATIVE
Nitrite, UA: NEGATIVE
Protein, UA: NEGATIVE
SPEC GRAV UA: 1.02
Urobilinogen, UA: 0.2
pH, UA: 5.5

## 2014-02-17 LAB — COMPLETE METABOLIC PANEL WITH GFR
ALK PHOS: 97 U/L (ref 39–117)
ALT: 21 U/L (ref 0–35)
AST: 16 U/L (ref 0–37)
Albumin: 4.3 g/dL (ref 3.5–5.2)
BILIRUBIN TOTAL: 0.5 mg/dL (ref 0.2–1.2)
BUN: 18 mg/dL (ref 6–23)
CO2: 26 meq/L (ref 19–32)
CREATININE: 0.85 mg/dL (ref 0.50–1.10)
Calcium: 9.8 mg/dL (ref 8.4–10.5)
Chloride: 102 mEq/L (ref 96–112)
GFR, EST AFRICAN AMERICAN: 89 mL/min
GFR, EST NON AFRICAN AMERICAN: 77 mL/min
GLUCOSE: 116 mg/dL — AB (ref 70–99)
Potassium: 4.1 mEq/L (ref 3.5–5.3)
SODIUM: 138 meq/L (ref 135–145)
TOTAL PROTEIN: 6.7 g/dL (ref 6.0–8.3)

## 2014-02-17 LAB — LIPID PANEL
CHOL/HDL RATIO: 3.7 ratio
Cholesterol: 160 mg/dL (ref 0–200)
HDL: 43 mg/dL (ref >39)
LDL Cholesterol: 94 mg/dL (ref 0–99)
TRIGLYCERIDES: 113 mg/dL (ref <150)
VLDL: 23 mg/dL (ref 0–40)

## 2014-02-17 LAB — IFOBT (OCCULT BLOOD): IMMUNOLOGICAL FECAL OCCULT BLOOD TEST: NEGATIVE

## 2014-02-17 LAB — T3, FREE: T3, Free: 3.2 pg/mL (ref 2.3–4.2)

## 2014-02-17 LAB — TSH: TSH: 0.898 u[IU]/mL (ref 0.350–4.500)

## 2014-02-17 LAB — POCT GLYCOSYLATED HEMOGLOBIN (HGB A1C): Hemoglobin A1C: 6.4

## 2014-02-17 LAB — HEPATITIS C ANTIBODY: HCV AB: NEGATIVE

## 2014-02-17 LAB — T4, FREE: FREE T4: 1.3 ng/dL (ref 0.80–1.80)

## 2014-02-17 MED ORDER — LISINOPRIL-HYDROCHLOROTHIAZIDE 20-12.5 MG PO TABS: ORAL_TABLET | ORAL | Status: DC

## 2014-02-17 NOTE — Progress Notes (Signed)
Subjective:    Patient ID: Cheryl Morton, female    DOB: October 24, 1956, 58 y.o.   MRN: [id number]  HPI  This 58 y.o. AA female is here for CPE; PAP not indicated since pt is s/p TAH for benign reason. She has intact ovaries but no complaint of pelvic pain. She has Type II DM and is owrking on lifestyle changes. FSBS= 80-150 w/o hypoglycemic episodes.  Pt is complaint w/ all medications, including Alprazolam taken prn anxiety. No medication side effects w/ Bupropion.   Pt recently treated at [facility] for URI; strep test negative. Pt has no lingering or unresolved symptoms except those associated w/ Allergic Rhinitis.  HCM: Vision- annually.            MMG- Current.            CRS- Current; pt reports last one 5 years ago.            IMM- Current.  Patient Active Problem List   Diagnosis Date Noted  . Obesity, unspecified 08/18/2013  . Diabetes mellitus 09/24/2012  . GERD 06/27/2010  . HYPERLIPIDEMIA-MIXED 07/25/2009  . TOBACCO ABUSE 07/25/2009  . ESSENTIAL HYPERTENSION, BENIGN 07/25/2009   Prior to Admission medications   Medication Sig Start Date End Date Taking? Authorizing Provider  ALPRAZolam Duanne Moron) 0.5 MG tablet TAKE 1 TABLET BY MOUTH TWICE DAILY AS NEEDED 01/25/14  Yes Barton Fanny, MD  aspirin 81 MG tablet Take 81 mg by mouth daily.   Yes Historical Provider, MD  buPROPion (WELLBUTRIN XL) 300 MG 24 hr tablet TAKE 1 TABLET BY MOUTH EVERY DAY 08/18/13  Yes Barton Fanny, MD  fluticasone [facility]) 50 MCG/ACT nasal spray Place 2 sprays into the nose daily. 09/24/12  Yes Argentina Donovan, PA-C  glucose blood (ONE TOUCH ULTRA TEST) test strip USE AS DIRECTED 02/11/13  Yes Dionne Bucy McClung, PA-C  Guaifenesin [facility] MAXIMUM STRENGTH) 1200 MG TB12 Take 1 tablet (1,200 mg total) by mouth every 12 (twelve) hours as needed. 09/24/13  Yes Chelle S Jeffery, PA-C  hydrocortisone (ANUCORT-HC) 25 MG suppository Place 1 suppository (25 mg total) rectally 2 (two) times daily. 02/11/13   Yes Dionne Bucy McClung, PA-C  ipratropium (ATROVENT) 0.03 % nasal spray Place 2 sprays into the nose 2 (two) times daily. 09/24/13  Yes Chelle S Jeffery, PA-C  Lancets (ONETOUCH ULTRASOFT) lancets USE AS DIRECTED THREE TIMES PER WEEK 02/11/13  Yes Dionne Bucy McClung, PA-C  lisinopril-hydrochlorothiazide (PRINZIDE,ZESTORETIC) 20-12.5 MG per tablet TAKE 2 TABLETS BY MOUTH DAILY   Yes Barton Fanny, MD  metFORMIN (GLUCOPHAGE) 500 MG tablet TAKE 1 TABLET BY MOUTH TWICE DAILY WITH A MEAL 08/18/13  Yes Barton Fanny, MD  RABEprazole (ACIPHEX) 20 MG tablet Take 2 tablets by mouth daily. 08/18/13  Yes Barton Fanny, MD  rosuvastatin (CRESTOR) 10 MG tablet Take 1 tablet (10 mg total) by mouth daily. 08/18/13  Yes Barton Fanny, MD  Travoprost, BAK Free, (TRAVATAN) 0.004 % SOLN ophthalmic solution 1 drop at bedtime. NEED REFILLS   Yes Historical Provider, MD   PMHx, Surg Hx, Soc and Fam Hx reviewed.   Review of Systems  HENT: Positive for rhinorrhea.   Gastrointestinal: Positive for anal bleeding.  Allergic/Immunologic: Positive for environmental allergies.  Psychiatric/Behavioral: Positive for agitation. The patient is nervous/anxious.   All other systems reviewed and are negative.      Objective:   Physical Exam  Nursing note and vitals reviewed. Constitutional: She is oriented to person,  place, and time. Vital signs are normal. She appears well-developed and well-nourished. No distress.  HENT:  Head: Normocephalic and atraumatic.  Right Ear: Hearing, tympanic membrane, external ear and ear canal normal.  Left Ear: Hearing, tympanic membrane, external ear and ear canal normal.  Nose: Nose normal. No rhinorrhea, nasal deformity or septal deviation.  Mouth/Throat: Uvula is midline, oropharynx is clear and moist and mucous membranes are normal. No oral lesions. Normal dentition. No dental caries.  Eyes: EOM and lids are normal. Pupils are equal, round, and reactive to light. Right  conjunctiva is injected. Left conjunctiva is injected. No scleral icterus.  Fundoscopic exam:      The right eye shows no arteriolar narrowing, no AV nicking, no hemorrhage and no papilledema. The right eye shows red reflex.       The left eye shows no arteriolar narrowing, no AV nicking, no hemorrhage and no papilledema. The left eye shows red reflex.  Neck: Trachea normal, normal range of motion and full passive range of motion without pain. Neck supple. No JVD present. No spinous process tenderness and no muscular tenderness present. Carotid bruit is not present. No mass and no thyromegaly present.  Cardiovascular: Normal rate, regular rhythm, S1 normal, S2 normal and normal heart sounds.   No extrasystoles are present. PMI is not displaced.  Exam reveals no gallop and no friction rub.   No murmur heard. Pulses:      Carotid pulses are 1+ on the right side, and 1+ on the left side.      Radial pulses are 1+ on the right side, and 1+ on the left side.       Femoral pulses are 1+ on the right side, and 1+ on the left side.      Dorsalis pedis pulses are 1+ on the right side, and 1+ on the left side.       Posterior tibial pulses are 1+ on the right side, and 1+ on the left side.  Pulmonary/Chest: Effort normal and breath sounds normal. No respiratory distress. Right breast exhibits no inverted nipple, no mass, no nipple discharge, no skin change and no tenderness. Left breast exhibits no inverted nipple, no mass, no nipple discharge, no skin change and no tenderness. Breasts are symmetrical.  Abdominal: Soft. Normal appearance and bowel sounds are normal. She exhibits no distension, no abdominal bruit, no pulsatile midline mass and no mass. There is no hepatosplenomegaly. There is no tenderness. There is no guarding and no CVA tenderness.  Genitourinary: Rectum normal. Rectal exam shows no external hemorrhoid, no fissure, no mass, no tenderness and anal tone normal. Guaiac negative stool.  NEFG;  PAP/ pelvic deferred.  Musculoskeletal: Normal range of motion. She exhibits no edema and no tenderness.  Lymphadenopathy:       Head (right side): No submental, no submandibular, no tonsillar, no posterior auricular and no occipital adenopathy present.       Head (left side): No submental, no submandibular, no tonsillar, no posterior auricular and no occipital adenopathy present.    She has no cervical adenopathy.    She has no axillary adenopathy.       Right: No inguinal and no supraclavicular adenopathy present.       Left: No inguinal and no supraclavicular adenopathy present.  Neurological: She is alert and oriented to person, place, and time. She has normal strength and normal reflexes. She displays no atrophy. No cranial nerve deficit or sensory deficit. She exhibits normal muscle tone. Coordination and  gait normal.  Skin: Skin is warm, dry and intact. No ecchymosis, no petechiae and no rash noted. She is not diaphoretic. There is erythema. No cyanosis. Nails show no clubbing.  Pt has skin tag in L axilla and multiple similar-sized nevi on inner thighs.  Psychiatric: She has a normal mood and affect. Her speech is normal and behavior is normal. Judgment and thought content normal. Cognition and memory are normal.    Results for orders placed in visit on 02/17/14  POCT URINALYSIS DIPSTICK      Result Value Ref Range   Color, UA yellow     Clarity, UA clear     Glucose, UA neg     Bilirubin, UA neg     Ketones, UA neg     Spec Grav, UA 1.020     Blood, UA trace-intact     pH, UA 5.5     Protein, UA neg     Urobilinogen, UA 0.2     Nitrite, UA neg     Leukocytes, UA Negative    IFOBT (OCCULT BLOOD)      Result Value Ref Range   IFOBT Negative    POCT GLYCOSYLATED HEMOGLOBIN (HGB A1C)      Result Value Ref Range   Hemoglobin A1C 6.4         Assessment & Plan:  Annual physical exam - Plan: POCT urinalysis dipstick, TSH, T4, free, T3, Free, Hepatitis C antibody, COMPLETE  METABOLIC PANEL WITH GFR, Lipid panel, IFOBT POC (occult bld, rslt in office), POCT glycosylated hemoglobin (Hb A1C)  Diabetes - Plan: HM Diabetes Foot Exam, POCT glycosylated hemoglobin (Hb A1C)  HYPERLIPIDEMIA-MIXED - Plan: Lipid panel  Essential hypertension, benign - Plan: COMPLETE METABOLIC PANEL WITH GFR  Need for Tdap vaccination - Plan: Tdap vaccine greater than or equal to 7yo IM  Meds ordered this encounter  Medications  . lisinopril-hydrochlorothiazide (PRINZIDE,ZESTORETIC) 20-12.5 MG per tablet    Sig: TAKE 2 TABLETS BY MOUTH DAILY    Dispense:  180 tablet    Refill:  3

## 2014-02-17 NOTE — Patient Instructions (Signed)
Keeping You Healthy  Get These Tests  Blood Pressure- Have your blood pressure checked by your healthcare provider at least once a year.  Normal blood pressure is 120/80.  Weight- Have your body mass index (BMI) calculated to screen for obesity.  BMI is a measure of body fat based on height and weight.  You can calculate your own BMI at www.nhlbisupport.com/bmi/  Cholesterol- Have your cholesterol checked every year.  Diabetes- Have your blood sugar checked every year if you have high blood pressure, high cholesterol, a family history of diabetes or if you are overweight.  Pap Smear- Have a pap smear every 1 to 3 years if you have been sexually active.  If you are older than 65 and recent pap smears have been normal you may not need additional pap smears.  In addition, if you have had a hysterectomy  For benign disease additional pap smears are not necessary.  Mammogram-Yearly mammograms are essential for early detection of breast cancer  Screening for Colon Cancer- Colonoscopy starting at age 50. Screening may begin sooner depending on your family history and other health conditions.  Follow up colonoscopy as directed by your Gastroenterologist.  Screening for Osteoporosis- Screening begins at age 65 with bone density scanning, sooner if you are at higher risk for developing Osteoporosis.  Get these medicines  Calcium with Vitamin D- Your body requires 1200-1500 mg of Calcium a day and 800-1000 IU of Vitamin D a day.  You can only absorb 500 mg of Calcium at a time therefore Calcium must be taken in 2 or 3 separate doses throughout the day.  Hormones- Hormone therapy has been associated with increased risk for certain cancers and heart disease.  Talk to your healthcare provider about if you need relief from menopausal symptoms.  Aspirin- Ask your healthcare provider about taking Aspirin to prevent Heart Disease and Stroke.  Get these Immuniztions  Flu shot- Every fall  Pneumonia  shot- Once after the age of 65; if you are younger ask your healthcare provider if you need a pneumonia shot.  Tetanus- Every ten years.  Zostavax- Once after the age of 60 to prevent shingles.  Take these steps  Don't smoke- Your healthcare provider can help you quit. For tips on how to quit, ask your healthcare provider or go to www.smokefree.gov or call 1-800 QUIT-NOW.  Be physically active- Exercise 5 days a week for a minimum of 30 minutes.  If you are not already physically active, start slow and gradually work up to 30 minutes of moderate physical activity.  Try walking, dancing, bike riding, swimming, etc.  Eat a healthy diet- Eat a variety of healthy foods such as fruits, vegetables, whole grains, low fat milk, low fat cheeses, yogurt, lean meats, chicken, fish, eggs, dried beans, tofu, etc.  For more information go to www.thenutritionsource.org  Dental visit- Brush and floss teeth twice daily; visit your dentist twice a year.  Eye exam- Visit your Optometrist or Ophthalmologist yearly.  Drink alcohol in moderation- Limit alcohol intake to one drink or less a day.  Never drink and drive.  Depression- Your emotional health is as important as your physical health.  If you're feeling down or losing interest in things you normally enjoy, please talk to your healthcare provider.  Seat Belts- can save your life; always wear one  Smoke/Carbon Monoxide detectors- These detectors need to be installed on the appropriate level of your home.  Replace batteries at least once a year.  Violence- If anyone   is threatening or hurting you, please tell your healthcare provider.  Living Will/ Health care power of attorney- Discuss with your healthcare provider and family.    Exercise to Lose Weight Exercise and a healthy diet may help you lose weight. Your doctor may suggest specific exercises. EXERCISE IDEAS AND TIPS  Choose low-cost things you enjoy doing, such as walking, bicycling, or  exercising to workout videos.  Take stairs instead of the elevator.  Walk during your lunch break.  Park your car further away from work or school.  Go to a gym or an exercise class.  Start with 5 to 10 minutes of exercise each day. Build up to 30 minutes of exercise 4 to 6 days a week.  Wear shoes with good support and comfortable clothes.  Stretch before and after working out.  Work out until you breathe harder and your heart beats faster.  Drink extra water when you exercise.  Do not do so much that you hurt yourself, feel dizzy, or get very short of breath. Exercises that burn about 150 calories:  Running 1  miles in 15 minutes.  Playing volleyball for 45 to 60 minutes.  Washing and waxing a car for 45 to 60 minutes.  Playing touch football for 45 minutes.  Walking 1  miles in 35 minutes.  Pushing a stroller 1  miles in 30 minutes.  Playing basketball for 30 minutes.  Raking leaves for 30 minutes.  Bicycling 5 miles in 30 minutes.  Walking 2 miles in 30 minutes.  Dancing for 30 minutes.  Shoveling snow for 15 minutes.  Swimming laps for 20 minutes.  Walking up stairs for 15 minutes.  Bicycling 4 miles in 15 minutes.  Gardening for 30 to 45 minutes.  Jumping rope for 15 minutes.  Washing windows or floors for 45 to 60 minutes. Document Released: 01/12/2011 Document Revised: 03/03/2012 Document Reviewed: 01/12/2011 [facility] Patient Information 2014 [place], [place].    How to Increase Fiber in the Meal Plan for Diabetes Increasing fiber in the diet has many benefits including lowering blood cholesterol, helping to control blood glucose (sugar), preventing constipation, and aiding in weight management by helping you feel full longer. Start adding fiber to your diet slowly. A gradual substitution of high-fiber foods for low-fiber foods will allow the digestive tract to adjust. Most men under 42 years of age should aim to eat 38 g of fiber a day. Women  should aim for 25 g. Over 68 years of age, most men need 30 g of fiber and most women need 21 g. Below are some suggestions for increasing fiber.  Try whole-wheat bread instead of white bread. Look for words high on the list of ingredients, such as whole wheat, whole rye, or whole oats.  Try a baked potato with skin instead of mashed potatoes.  Try a fresh apple with skin instead of applesauce.  Try a fresh orange instead of orange juice.  Try popcorn instead of potato chips.  Try bran cereal instead of corn flakes.  Try kidney, whole pinto, or garbanzo beans instead of bread.  Try whole-grain crackers instead of saltine crackers.  Try whole-wheat pasta instead of regular varieties. While on a high-fiber diet:   Drink enough water and fluids to keep your urine clear or pale yellow.  Eat a variety of high fiber foods such as fruits, vegetables, whole grains, nuts, and seeds.  Aim for 5 servings of fruit and vegetables per day.  Try to increase your intake of  fiber by eating high-fiber foods instead of taking fiber supplements that contain small amounts of fiber. There can be additional benefits for long-term health and blood glucose control with high-fiber foods.  SOURCES OF FIBER The following list shows the average dietary fiber for types of food in the various food groups. Starches and Breads / Dietary Fiber (g)  Whole-grain bread, 1 slice / 2 g  Whole-grain cereals,  cup / 3 g  Bran cereals,  to  cup / 8 g  Starchy vegetables,  cup / 3 g  Oatmeal,  cup / 2 g  Whole-wheat pasta,  cup / 2 g  Brown rice,  cup / 2 g  Barley,  cup / 3 g Legumes / Dietary Fiber (g)  Beans,  cup / 8 g  Peas,  cup / 8 g  Lentils ,  cup / 8 g Meat and Meat Substitutes / Dietary Fiber (g) This group averages 0 grams of fiber. Exceptions are:  Nuts, seeds, 1 oz or  cup / 3 g  Chunky peanut butter, 2 tbs / 3 g Vegetables / Dietary Fiber (g)  Cooked vegetables,  to   cup / 2 to 3 g  Raw vegetables, 1 to 2 cups / 2 to 3 g Fruit / Dietary Fiber (g)  Raw or cooked fruit,  cup or 1 small, fresh piece / 2 g Milk / Dietary Fiber (g)  Milk, 1 cup or 8 oz / 0 g Fats and Oils / Dietary Fiber (g)  Fats and oils, 1 tsp / 0 g You can determine how much fiber you are eating by reading the Nutrition Facts panel on the labels of the foods you eat. FIBER IN SPECIFIC FOODS Cereals / Dietary Fiber (g)  All Bran,  cup / 9 g  All Bran with Extra Fiber,  cup / 13 g  Bran Flakes,  cup / 4 g  Cheerios,  cup / 1.5 g  Corn Bran,  cup / 4 g  Corn Flakes,  cup / 0.75 g  Cracklin' Oat Bran,  cup / 4 g  Fiber One,  cup / 13 g  Grape Nuts, 3 tbs / 3 g  Grape Nuts Flakes,  cup / 3 g  Noodles,  cup, cooked / 0.5 g  Nutrigrain Wheat,  cup / 3.5 g  Oatmeal,  cup, cooked / 1.1 g  Pasta, white (macaroni, spaghetti),  cup, cooked / 0.5 g  Pasta, whole-wheat (macaroni, spaghetti),  cup, cooked / 2 g  [place],  cup, cooked / 3 g  Rice, wild,  cup, cooked / 0.5 g  Rice, brown,  cup, cooked / 1 g  Rice, white,  cup, cooked / 0.2 g  Shredded Wheat, bite-sized,  cup / 2 g  Total,  cup / 1.75 g  Wheat Chex,  cup / 2.5 g  Wheatena,  cup, cooked / 4 g  Wheaties,  cup / 2.75 g Bread, Starchy Vegetables, and Dried Peas and Beans / Dietary Fiber (g)  Bagel, whole / 0.6 g  Baked beans in tomato sauce,  cup, cooked / 3 g  Bran muffin, 1 small / 2.5 g  Bread, cracked wheat, 1 slice / 2.5 g  Bread, pumpernickel, 1 slice / 2.5 g  Bread, white, 1 slice / 0.4 g  Bread, whole-wheat, 1 slice / 1.4 g  Corn,  cup, canned / 2.9 g  Kidney beans,  cup, cooked / 3.5 g  Lentils, cup, cooked / 3 g  Lima beans,  cup, cooked / 4 g  Navy beans,  cup, cooked / 4 g  Peas,  cup, cooked / 4 g  Popcorn, 3 cups popped, unbuttered / 3.5 g  Potato, baked (with skin), 1 small / 4 g  Potato, baked (without skin), 1 small  / 2 g  Ry-Krisp, 4 crackers / 3 g  Saltine crackers, 6 squares / 0 g  Split peas,  cup, cooked / 2.5 g  Yams (sweet potato),  cup / 1.7 g Fruit / Dietary Fiber (g)  Apple, 1 small, fresh, with skin / 4 g  Apple juice,  cup / 0.4 g  Apricots, 4 medium, fresh / 4 g  Apricots, 7 halves, dried / 2 g  Banana,  medium / 1.2 g  Blueberries,  cup / 2 g  Cantaloupe, melon / 1.3 g  Cherries,  cup, canned / 1.4 g  Grapefruit,  medium / 1.6 g  Grapes, 15 small / 1.2 g  Grape juice,  cup / 0.5 g  Orange, 1 medium, fresh / 2 g  Orange juice,  cup / 0.5 g  Peach, 1 medium,fresh, with skin / 2 g  Pear, 1 medium, fresh, with skin / 4 g  Pineapple, cup, canned / 0.7 g  Plums, 2 whole / 2 g  Prunes, 3 whole / 1.5 g  Raspberries, 1 cup / 6 g  Strawberries, 1  cup / 4 g  Watermelon, 1  cup / 0.5 g Vegetables / Dietary Fiber (g)  Asparagus,  cup, cooked / 1 g  Beans, green and wax,  cup, cooked / 1.6 g  Beets,  cup, cooked / 1.8 g  Broccoli,  cup, cooked / 2.2 g  Brussels sprouts,  cup, cooked / 4 g  Cabbage,  cup, cooked / 2.5 g  Carrots,  cup, cooked / 2.3 g  Cauliflower,  cup, cooked / 1.1 g  Celery, 1 cup, raw / 1.5 g  Cucumber, 1 cup, raw / 0.8 g  Green pepper,  cup sliced, cooked / 1.5 g  Lettuce, 1 cup, sliced / 0.9 g  Mushrooms, 1 cup sliced, raw / 1.8 g  Onion, 1 cup sliced, raw / 1.6 g  Spinach,  cup, cooked / 2.4 g  Tomato, 1 medium, fresh / 1.5 g  Tomato juice,  cup / 0 g  Zucchini,  cup, cooked / 1.8 g Document Released: 06/01/2002 Document Revised: 04/06/2013 Document Reviewed: 06/28/2009 [facility] Patient Information 2014 [place], [place].

## 2014-02-21 ENCOUNTER — Encounter: Payer: Self-pay | Admitting: Family Medicine

## 2014-02-22 ENCOUNTER — Other Ambulatory Visit: Payer: Self-pay | Admitting: Physician Assistant

## 2014-04-15 ENCOUNTER — Other Ambulatory Visit: Payer: Self-pay | Admitting: Physician Assistant

## 2014-04-18 ENCOUNTER — Other Ambulatory Visit: Payer: Self-pay | Admitting: Family Medicine

## 2014-04-18 NOTE — Telephone Encounter (Signed)
Alprazolam refill phoned to pt's pharmacy. 

## 2014-06-04 ENCOUNTER — Other Ambulatory Visit: Payer: Self-pay | Admitting: Family Medicine

## 2014-06-04 DIAGNOSIS — Z1231 Encounter for screening mammogram for malignant neoplasm of breast: Secondary | ICD-10-CM

## 2014-06-14 ENCOUNTER — Ambulatory Visit (HOSPITAL_COMMUNITY)
Admission: RE | Admit: 2014-06-14 | Discharge: 2014-06-14 | Disposition: A | Discharge status: Home / Self Care | Payer: BC Managed Care – PPO | Source: Ambulatory Visit | Attending: Family Medicine | Admitting: Family Medicine

## 2014-06-14 DIAGNOSIS — Z1231 Encounter for screening mammogram for malignant neoplasm of breast: Secondary | ICD-10-CM | POA: Insufficient documentation

## 2014-06-15 ENCOUNTER — Other Ambulatory Visit: Payer: Self-pay | Admitting: Family Medicine

## 2014-06-16 NOTE — Telephone Encounter (Signed)
Alprazolam refill 0.5 mg (#60 w/ no refills) phoned to pt's pharmacy.

## 2014-06-18 ENCOUNTER — Other Ambulatory Visit: Payer: Self-pay | Admitting: Family Medicine

## 2014-07-21 ENCOUNTER — Other Ambulatory Visit: Payer: Self-pay | Admitting: Family Medicine

## 2014-08-18 ENCOUNTER — Encounter: Payer: Self-pay | Admitting: Family Medicine

## 2014-08-18 ENCOUNTER — Ambulatory Visit (INDEPENDENT_AMBULATORY_CARE_PROVIDER_SITE_OTHER): Payer: BC Managed Care – PPO | Admitting: Family Medicine

## 2014-08-18 VITALS — BP 128/84 | HR 74 | Temp 98.6°F | Resp 16 | Ht 65.0 in | Wt 217.0 lb

## 2014-08-18 DIAGNOSIS — Z760 Encounter for issue of repeat prescription: Secondary | ICD-10-CM

## 2014-08-18 DIAGNOSIS — I10 Essential (primary) hypertension: Secondary | ICD-10-CM

## 2014-08-18 DIAGNOSIS — E119 Type 2 diabetes mellitus without complications: Secondary | ICD-10-CM

## 2014-08-18 DIAGNOSIS — E785 Hyperlipidemia, unspecified: Secondary | ICD-10-CM

## 2014-08-18 LAB — POCT GLYCOSYLATED HEMOGLOBIN (HGB A1C): Hemoglobin A1C: 6.6

## 2014-08-18 MED ORDER — BUPROPION HCL ER (XL) 300 MG PO TB24: ORAL_TABLET | ORAL | Status: DC

## 2014-08-18 MED ORDER — HYDROCORTISONE ACETATE 25 MG RE SUPP: 25.0000 mg | Freq: Two times a day (BID) | RECTAL | Status: DC

## 2014-08-18 MED ORDER — METFORMIN HCL 500 MG PO TABS: ORAL_TABLET | ORAL | Status: DC

## 2014-08-18 MED ORDER — ACIPHEX 20 MG PO TBEC: DELAYED_RELEASE_TABLET | ORAL | Status: DC

## 2014-08-18 MED ORDER — FLUTICASONE PROPIONATE 50 MCG/ACT NA SUSP: 2.0000 | Freq: Every day | NASAL | Status: DC

## 2014-08-18 MED ORDER — ROSUVASTATIN CALCIUM 10 MG PO TABS: 10.0000 mg | ORAL_TABLET | Freq: Every day | ORAL | Status: DC

## 2014-08-18 MED ORDER — ALPRAZOLAM 0.5 MG PO TABS: ORAL_TABLET | ORAL | Status: DC

## 2014-08-18 NOTE — Patient Instructions (Signed)
You have maintained great control of Diabetes over the last year. Keep up the good work. Below is some information re: MEDITERRANEAN DIET, which promotes healthy lifestyle through nutrition and exercise.       Mediterranean Diet  Why follow it? Research shows.   Those who follow the Mediterranean diet have a reduced risk of heart disease    The diet is associated with a reduced incidence of Parkinson's and Alzheimer's diseases   People following the diet may have longer life expectancies and lower rates of chronic diseases    The Dietary Guidelines for Americans recommends the Mediterranean diet as an eating plan to promote health and prevent disease  What Is the Mediterranean Diet?    Healthy eating plan based on typical foods and recipes of Mediterranean-style cooking   The diet is primarily a plant based diet; these foods should make up a majority of meals   Starches - Plant based foods should make up a majority of meals - They are an important sources of vitamins, minerals, energy, antioxidants, and fiber - Choose whole grains, foods high in fiber and minimally processed items  - Typical grain sources include wheat, oats, barley, corn, brown rice, bulgar, farro, millet, polenta, couscous  - Various types of beans include chickpeas, lentils, fava beans, black beans, white beans   Fruits  Veggies - Large quantities of antioxidant rich fruits & veggies; 6 or more servings  - Vegetables can be eaten raw or lightly drizzled with oil and cooked  - Vegetables common to the traditional Mediterranean Diet include: artichokes, arugula, beets, broccoli, brussel sprouts, cabbage, carrots, celery, collard greens, cucumbers, eggplant, kale, leeks, lemons, lettuce, mushrooms, okra, onions, peas, peppers, potatoes, pumpkin, radishes, rutabaga, shallots, spinach, sweet potatoes, turnips, zucchini - Fruits common to the Mediterranean Diet include: apples, apricots, avocados, cherries, clementines, dates,  figs, grapefruits, grapes, melons, nectarines, oranges, peaches, pears, pomegranates, strawberries, tangerines  Fats - Replace butter and margarine with healthy oils, such as olive oil, canola oil, and tahini  - Limit nuts to no more than a handful a day  - Nuts include walnuts, almonds, pecans, pistachios, pine nuts  - Limit or avoid candied, honey roasted or heavily salted nuts - Olives are central to the [employer] - can be eaten whole or used in a variety of dishes   Meats Protein - Limiting red meat: no more than a few times a month - When eating red meat: choose lean cuts and keep the portion to the size of deck of cards - Eggs: approx. 0 to 4 times a week  - Fish and lean poultry: at least 2 a week  - Healthy protein sources include, chicken, [place], lean beef, lamb - Increase intake of seafood such as tuna, salmon, trout, mackerel, shrimp, scallops - Avoid or limit high fat processed meats such as sausage and bacon  Dairy - Include moderate amounts of low fat dairy products  - Focus on healthy dairy such as fat free yogurt, skim milk, low or reduced fat cheese - Limit dairy products higher in fat such as whole or 2% milk, cheese, ice cream  Alcohol - Moderate amounts of red wine is ok  - No more than 5 oz daily for women (all ages) and men older than age 53  - No more than 10 oz of wine daily for men younger than 2  Other - Limit sweets and other desserts  - Use herbs and spices instead of salt to flavor foods  -  Herbs and spices common to the traditional Mediterranean Diet include: basil, bay leaves, chives, cloves, cumin, fennel, garlic, lavender, marjoram, mint, oregano, parsley, pepper, rosemary, sage, savory, sumac, tarragon, thyme   It's not just a diet, it's a lifestyle:    The Mediterranean diet includes lifestyle factors typical of those in the region    Foods, drinks and meals are best eaten with others and savored   Daily physical activity is important for  overall good health   This could be strenuous exercise like running and aerobics   This could also be more leisurely activities such as walking, housework, yard-work, or taking the stairs   Moderation is the key; a balanced and healthy diet accommodates most foods and drinks   Consider portion sizes and frequency of consumption of certain foods   Meal Ideas & Options:    Breakfast:  o Whole wheat toast or whole wheat English muffins with peanut butter & hard boiled egg o Steel cut oats topped with apples & cinnamon and skim milk  o Fresh fruit: banana, strawberries, melon, berries, peaches  o Smoothies: strawberries, bananas, greek yogurt, peanut butter o Low fat greek yogurt with blueberries and granola  o Egg white omelet with spinach and mushrooms o Breakfast couscous: whole wheat couscous, apricots, skim milk, cranberries    Sandwiches:  o Hummus and grilled vegetables (peppers, zucchini, squash) on whole wheat bread   o Grilled chicken on whole wheat pita with lettuce, tomatoes, cucumbers or tzatziki  o Tuna salad on whole wheat bread: tuna salad made with greek yogurt, olives, red peppers, capers, green onions o Garlic rosemary lamb pita: lamb sauted with garlic, rosemary, salt & pepper; add lettuce, cucumber, greek yogurt to pita - flavor with lemon juice and black pepper    Seafood:  o Mediterranean grilled salmon, seasoned with garlic, basil, parsley, lemon juice and black pepper o Shrimp, lemon, and spinach whole-grain pasta salad made with low fat greek yogurt  o Seared scallops with lemon orzo  o Seared tuna steaks seasoned salt, pepper, coriander topped with tomato mixture of olives, tomatoes, olive oil, minced garlic, parsley, green onions and cappers    Meats:  o Herbed greek chicken salad with kalamata olives, cucumber, feta  o Red bell peppers stuffed with spinach, bulgur, lean ground beef (or lentils) & topped with feta   o Kebabs: skewers of chicken, tomatoes, onions,  zucchini, squash  o [place] burgers: made with red onions, mint, dill, lemon juice, feta cheese topped with roasted red peppers   Vegetarian o Cucumber salad: cucumbers, artichoke hearts, celery, red onion, feta cheese, tossed in olive oil & lemon juice  o Hummus and whole grain pita points with a greek salad (lettuce, tomato, feta, olives, cucumbers, red onion) o Lentil soup with celery, carrots made with vegetable broth, garlic, salt and pepper  o Tabouli salad: parsley, bulgur, mint, scallions, cucumbers, tomato, radishes, lemon juice, olive oil, salt and pepper. o

## 2014-08-18 NOTE — Progress Notes (Signed)
S:  This 58 y.o. AA female is here for DM and HTN follow-up. She monitors FSBS w/ values 110- 120; no hypoglycemia reported. Pt's mother and sister passed away recently; she also has lost friends but is coping well. She denies diaphoresis, fatigue, vision disturbances, CP or tightness, palpitations, SOB or DOE, edema, myalgias, abd or back pain, HA, dizziness, numbness, weakness or syncope.  Patient Active Problem List   Diagnosis Date Noted  . Obesity, unspecified 08/18/2013  . Diabetes mellitus 09/24/2012  . GERD 06/27/2010  . HYPERLIPIDEMIA-MIXED 07/25/2009  . TOBACCO ABUSE 07/25/2009  . ESSENTIAL HYPERTENSION, BENIGN 07/25/2009    Prior to Admission medications   Medication Sig Start Date End Date Taking? Authorizing Provider  ACIPHEX 20 MG tablet TAKE 2 TABLETS BY MOUTH ONCE DAILY   Yes Barton Fanny, MD  ALPRAZolam (XANAX) 0.5 MG tablet TAKE 1 TABLET BY MOUTH TWICE DAILY AS NEEDED FOR ANXIETY   Yes Barton Fanny, MD  aspirin 81 MG tablet Take 81 mg by mouth daily.   Yes Historical Provider, MD  buPROPion (WELLBUTRIN XL) 300 MG 24 hr tablet TAKE 1 TABLET BY MOUTH EVERY DAY   Yes Barton Fanny, MD  fluticasone (FLONASE) 50 MCG/ACT nasal spray Place 2 sprays into both nostrils daily.   Yes Barton Fanny, MD  glucose blood (ONE TOUCH ULTRA TEST) test strip Check blood sugar three times a week.   Yes Barton Fanny, MD  hydrocortisone (ANUCORT-HC) 25 MG suppository Place 1 suppository (25 mg total) rectally 2 (two) times daily.   Yes Barton Fanny, MD  Lancets [facility] ULTRASOFT) lancets USE AS DIRECTED THREE TIMES PER WEEK 02/11/13  Yes Dionne Bucy McClung, PA-C  lisinopril-hydrochlorothiazide (PRINZIDE,ZESTORETIC) 20-12.5 MG per tablet TAKE 2 TABLETS BY MOUTH DAILY   Yes Barton Fanny, MD  metFORMIN (GLUCOPHAGE) 500 MG tablet TAKE 1 TABLET BY MOUTH TWICE DAILY WITH A MEAL   Yes Barton Fanny, MD  rosuvastatin (CRESTOR) 10 MG tablet Take 1 tablet  (10 mg total) by mouth daily. PATIENT NEEDS OFFICE VISIT FOR ADDITIONAL REFILLS   Yes Barton Fanny, MD  Travoprost, BAK Free, (TRAVATAN) 0.004 % SOLN ophthalmic solution 1 drop at bedtime. NEED REFILLS   Yes Historical Provider, MD  Guaifenesin (MUCINEX MAXIMUM STRENGTH) 1200 MG TB12 Take 1 tablet (1,200 mg total) by mouth every 12 (twelve) hours as needed. 09/24/13   Fara Chute, PA-C   History   Social History  . Marital Status: Divorced    Spouse Name: N/A    Number of Children: N/A  . Years of Education: N/A   Occupational History  . Works w/ [employer]  . Smoking status: Quit in 2012  . Smokeless tobacco: Not on file  . Alcohol Use: 1.8 oz/week    3 Glasses of wine per week  . Drug Use: No  . Sexual Activity: No   Other Topics Concern  . Not on file   Social History Narrative  . No narrative on file    ROS: As per HPI.  O: Filed Vitals:   08/18/14 0813  BP: 128/84  Pulse: 74  Temp: 98.6 F (37 C)  Resp: 16   GEN: In NAD; WN,WD. HENT: [place]/AT. EOMI w/ clear conj/sclerae. Otherwise unremarkable. COR: RRR. LUNGS: Normal resp rate and effort. SKIN: W&D; no diaphoresis, erythema or lesions. See DM FOOT EXAM. NEURO: A&O x 3; CNs intact. Nonfocal. PSYCH: Pleasant but somewhat subdued w/  mildly flat affect. Attentive. Speech and thought pattern normal. Judgement appropriate.   Results for orders placed in visit on 08/18/14  POCT GLYCOSYLATED HEMOGLOBIN (HGB A1C)      Result Value Ref Range   Hemoglobin A1C 6.6      A/P: Type II or unspecified type diabetes mellitus without mention of complication, not stated as uncontrolled - Stable and well controlled on current medications. Work on weight loss. Mediterranean Diet. Plan: POCT glycosylated hemoglobin (Hb A1C), HM Diabetes Foot Exam  Essential hypertension, benign- Stable and well controlled. No med change.  HYPERLIPIDEMIA-MIXED- Stable on Rosuvastatin w/o  adverse effects.  Issue of repeat prescriptions  Meds ordered this encounter  Medications  . ACIPHEX 20 MG tablet    Sig: TAKE 2 TABLETS BY MOUTH ONCE DAILY    Dispense:  60 tablet    Refill:  5  . ALPRAZolam (XANAX) 0.5 MG tablet    Sig: TAKE 1 TABLET BY MOUTH TWICE DAILY AS NEEDED FOR ANXIETY    Dispense:  60 tablet    Refill:  1  . buPROPion (WELLBUTRIN XL) 300 MG 24 hr tablet    Sig: TAKE 1 TABLET BY MOUTH EVERY DAY    Dispense:  90 tablet    Refill:  3  . fluticasone (FLONASE) 50 MCG/ACT nasal spray    Sig: Place 2 sprays into both nostrils daily.    Dispense:  16 g    Refill:  6  . hydrocortisone (ANUCORT-HC) 25 MG suppository    Sig: Place 1 suppository (25 mg total) rectally 2 (two) times daily.    Dispense:  24 suppository    Refill:  3  . metFORMIN (GLUCOPHAGE) 500 MG tablet    Sig: TAKE 1 TABLET BY MOUTH TWICE DAILY WITH A MEAL    Dispense:  180 tablet    Refill:  3  . rosuvastatin (CRESTOR) 10 MG tablet    Sig: Take 1 tablet (10 mg total) by mouth daily. PATIENT NEEDS OFFICE VISIT FOR ADDITIONAL REFILLS    Dispense:  90 tablet    Refill:  3

## 2014-09-26 ENCOUNTER — Other Ambulatory Visit: Payer: Self-pay | Admitting: Family Medicine

## 2014-09-27 NOTE — Addendum Note (Signed)
Addended by: Elwyn Reach A on: 09/27/2014 03:30 PM   Modules accepted: Orders

## 2014-10-08 ENCOUNTER — Other Ambulatory Visit: Payer: Self-pay

## 2014-11-25 ENCOUNTER — Other Ambulatory Visit: Payer: Self-pay | Admitting: Family Medicine

## 2014-11-28 NOTE — Telephone Encounter (Signed)
Alprazolam refill phoned to pt's pharmacy. 

## 2015-02-10 ENCOUNTER — Encounter: Payer: Self-pay | Admitting: Family Medicine

## 2015-02-13 ENCOUNTER — Other Ambulatory Visit: Payer: Self-pay | Admitting: Family Medicine

## 2015-02-13 DIAGNOSIS — R21 Rash and other nonspecific skin eruption: Secondary | ICD-10-CM

## 2015-02-13 DIAGNOSIS — L989 Disorder of the skin and subcutaneous tissue, unspecified: Secondary | ICD-10-CM

## 2015-02-23 ENCOUNTER — Encounter: Payer: Self-pay | Admitting: Family Medicine

## 2015-02-23 ENCOUNTER — Ambulatory Visit (INDEPENDENT_AMBULATORY_CARE_PROVIDER_SITE_OTHER): Payer: BLUE CROSS/BLUE SHIELD | Admitting: Family Medicine

## 2015-02-23 VITALS — BP 115/80 | HR 88 | Temp 98.8°F | Resp 16 | Ht 65.0 in | Wt 213.0 lb

## 2015-02-23 DIAGNOSIS — Z0000 Encounter for general adult medical examination without abnormal findings: Secondary | ICD-10-CM | POA: Diagnosis not present

## 2015-02-23 DIAGNOSIS — Z23 Encounter for immunization: Secondary | ICD-10-CM

## 2015-02-23 DIAGNOSIS — L659 Nonscarring hair loss, unspecified: Secondary | ICD-10-CM | POA: Diagnosis not present

## 2015-02-23 DIAGNOSIS — Z1211 Encounter for screening for malignant neoplasm of colon: Secondary | ICD-10-CM

## 2015-02-23 DIAGNOSIS — E119 Type 2 diabetes mellitus without complications: Secondary | ICD-10-CM | POA: Diagnosis not present

## 2015-02-23 DIAGNOSIS — L989 Disorder of the skin and subcutaneous tissue, unspecified: Secondary | ICD-10-CM

## 2015-02-23 DIAGNOSIS — I10 Essential (primary) hypertension: Secondary | ICD-10-CM | POA: Diagnosis not present

## 2015-02-23 LAB — COMPLETE METABOLIC PANEL WITH GFR
ALBUMIN: 4.2 g/dL (ref 3.5–5.2)
ALT: 26 U/L (ref 0–35)
AST: 19 U/L (ref 0–37)
Alkaline Phosphatase: 95 U/L (ref 39–117)
BUN: 17 mg/dL (ref 6–23)
CHLORIDE: 100 meq/L (ref 96–112)
CO2: 26 mEq/L (ref 19–32)
CREATININE: 0.9 mg/dL (ref 0.50–1.10)
Calcium: 9.7 mg/dL (ref 8.4–10.5)
GFR, Est African American: 82 mL/min
GFR, Est Non African American: 71 mL/min
GLUCOSE: 95 mg/dL (ref 70–99)
Potassium: 4 mEq/L (ref 3.5–5.3)
Sodium: 136 mEq/L (ref 135–145)
Total Bilirubin: 0.6 mg/dL (ref 0.2–1.2)
Total Protein: 6.8 g/dL (ref 6.0–8.3)

## 2015-02-23 LAB — CBC WITH DIFFERENTIAL/PLATELET
BASOS ABS: 0 10*3/uL (ref 0.0–0.1)
Basophils Relative: 0 % (ref 0–1)
Eosinophils Absolute: 0.2 10*3/uL (ref 0.0–0.7)
Eosinophils Relative: 2 % (ref 0–5)
HCT: 43.7 % (ref 36.0–46.0)
HEMOGLOBIN: 14.9 g/dL (ref 12.0–15.0)
LYMPHS ABS: 3 10*3/uL (ref 0.7–4.0)
LYMPHS PCT: 34 % (ref 12–46)
MCH: 30.4 pg (ref 26.0–34.0)
MCHC: 34.1 g/dL (ref 30.0–36.0)
MCV: 89.2 fL (ref 78.0–100.0)
MPV: 9.2 fL (ref 8.6–12.4)
Monocytes Absolute: 0.9 10*3/uL (ref 0.1–1.0)
Monocytes Relative: 10 % (ref 3–12)
NEUTROS PCT: 54 % (ref 43–77)
Neutro Abs: 4.8 10*3/uL (ref 1.7–7.7)
PLATELETS: 319 10*3/uL (ref 150–400)
RBC: 4.9 MIL/uL (ref 3.87–5.11)
RDW: 13.9 % (ref 11.5–15.5)
WBC: 8.9 10*3/uL (ref 4.0–10.5)

## 2015-02-23 LAB — LIPID PANEL
CHOLESTEROL: 136 mg/dL (ref 0–200)
HDL: 41 mg/dL — AB (ref >=46)
LDL CALC: 71 mg/dL (ref 0–99)
Total CHOL/HDL Ratio: 3.3 Ratio
Triglycerides: 119 mg/dL (ref <150)
VLDL: 24 mg/dL (ref 0–40)

## 2015-02-23 LAB — POCT GLYCOSYLATED HEMOGLOBIN (HGB A1C): HEMOGLOBIN A1C: 6.3

## 2015-02-23 MED ORDER — GLUCOSE BLOOD VI STRP: ORAL_STRIP | Status: DC

## 2015-02-23 MED ORDER — ALPRAZOLAM 0.5 MG PO TABS: 0.5000 mg | ORAL_TABLET | Freq: Two times a day (BID) | ORAL | Status: DC | PRN

## 2015-02-23 MED ORDER — FLUTICASONE PROPIONATE 50 MCG/ACT NA SUSP: 2.0000 | Freq: Every day | NASAL | Status: DC

## 2015-02-23 NOTE — Patient Instructions (Signed)

## 2015-02-23 NOTE — Progress Notes (Signed)
Subjective:    Patient ID: Cheryl Morton, female    DOB: 1956/08/22, 59 y.o.   MRN: [id number]  HPI  This 59 y.o. Female is here for CPE; she is s/p TAH for benign reason and is asymptomatic. She has Type II DM w/ FSBS = 105- 140; no hypoglycemia. Pt is compliant w/ medications and has lost some weight. She requests referral to [facility] for evaluation of thinning hair and a scalp lesion. No family hx of females w/ female-pattern hair loss.   HCM: MMG- Current (June 2015 > negative).           IMM- Needs [id number]           CRS: Current (2012 w/ 10-yr recall).           Vision- Annually or every 6 months; she has glaucoma.           Dental- Periodic.  Patient Active Problem List   Diagnosis Date Noted  . Obesity, unspecified 08/18/2013  . Diabetes mellitus 09/24/2012  . GERD 06/27/2010  . HYPERLIPIDEMIA-MIXED 07/25/2009  . TOBACCO ABUSE 07/25/2009  . ESSENTIAL HYPERTENSION, BENIGN 07/25/2009    Prior to Admission medications   Medication Sig Start Date End Date Taking? Authorizing Provider  ACIPHEX 20 MG tablet TAKE 2 TABLETS BY MOUTH ONCE DAILY 08/18/14  Yes Barton Fanny, MD  ALPRAZolam Duanne Moron) 0.5 MG tablet TAKE 1 TABLET BY MOUTH TWICE DAILY AS NEEDED FOR ANXIETY 11/28/14  Yes Barton Fanny, MD  aspirin 81 MG tablet Take 81 mg by mouth daily.   Yes Historical Provider, MD  buPROPion (WELLBUTRIN XL) 300 MG 24 hr tablet TAKE 1 TABLET BY MOUTH EVERY DAY 08/18/14  Yes Barton Fanny, MD  fluticasone [facility]) 50 MCG/ACT nasal spray Place 2 sprays into both nostrils daily. 08/18/14  Yes Barton Fanny, MD  glucose blood (ONE TOUCH ULTRA TEST) test strip Check blood sugar three times a week.   Yes Barton Fanny, MD  hydrocortisone (ANUCORT-HC) 25 MG suppository Place 1 suppository (25 mg total) rectally 2 (two) times daily. 08/18/14  Yes Barton Fanny, MD  Lancets [employer] [facility] ULTRASOFT) lancets USE AS DIRECTED THREE TIMES PER WEEK 02/11/13  Yes Argentina Donovan, PA-C    lisinopril-hydrochlorothiazide (PRINZIDE,ZESTORETIC) 20-12.5 MG per tablet TAKE 2 TABLETS BY MOUTH DAILY 02/17/14  Yes Barton Fanny, MD  metFORMIN (GLUCOPHAGE) 500 MG tablet TAKE 1 TABLET BY MOUTH TWICE DAILY WITH A MEAL 08/18/14  Yes Barton Fanny, MD  rosuvastatin (CRESTOR) 10 MG tablet Take 1 tablet (10 mg total) by mouth daily. PATIENT NEEDS OFFICE VISIT FOR ADDITIONAL REFILLS 08/18/14  Yes Barton Fanny, MD  Travoprost, BAK Free, (TRAVATAN) 0.004 % SOLN ophthalmic solution 1 drop at bedtime. NEED REFILLS   Yes Historical Provider, MD    Past Surgical History  Procedure Laterality Date  . Abdominal hysterectomy      History   Social History  . Marital Status: Divorced    Spouse Name: N/A  . Number of Children: N/A  . Years of Education: N/A   Occupational History  . Administrative position   Social History Main Topics  . Smoking status: Former Smoker -- 0.25 packs/day    Types: Cigarettes    Quit date: 12/24/2010  . Smokeless tobacco: Never Used  . Alcohol Use: 1.8 oz/week    3 Glasses of wine per week  . Drug Use: No  . Sexual Activity: No   Other Topics Concern  . Not  on file   Social History Narrative   Works for [employer].    Family History  Problem Relation Age of Onset  . Hypertension Mother   . Cancer Mother   . Multiple sclerosis Sister   . Diabetes Maternal Grandmother   . Hypertension Maternal Grandmother     Review of Systems  Skin:       Small bump on posterior scalp area; pt keeps hair short and it is trimmed in the back w/ clippers.  All other systems reviewed and are negative.     Objective:   Physical Exam  Constitutional: She is oriented to person, place, and time. Vital signs are normal. She appears well-developed and well-nourished. No distress.  Blood pressure 115/80, pulse 88, temperature 98.8 F (37.1 C), resp. rate 16, height 5\' 5"  (1.651 m), weight 213 lb (96.616 kg), SpO2 99 %.   HENT:  Head:  Normocephalic and atraumatic.  Right Ear: Hearing, tympanic membrane, external ear and ear canal normal.  Left Ear: Hearing, tympanic membrane and ear canal normal.  Nose: Nose normal. No nasal deformity or septal deviation.  Mouth/Throat: Uvula is midline, oropharynx is clear and moist and mucous membranes are normal. No oral lesions. Normal dentition. No dental caries.  Eyes: Conjunctivae, EOM and lids are normal. Pupils are equal, round, and reactive to light. No scleral icterus.  Wears contacts.  Neck: Trachea normal, normal range of motion, full passive range of motion without pain and phonation normal. Neck supple. No spinous process tenderness and no muscular tenderness present. No thyroid mass and no thyromegaly present.  Cardiovascular: Normal rate, regular rhythm, S1 normal, S2 normal, normal heart sounds, intact distal pulses and normal pulses.   No extrasystoles are present. PMI is not displaced.  Exam reveals no gallop and no friction rub.   No murmur heard. Pulmonary/Chest: Effort normal and breath sounds normal. No respiratory distress. Right breast exhibits no inverted nipple, no mass, no nipple discharge, no skin change and no tenderness. Left breast exhibits no inverted nipple, no mass, no nipple discharge, no skin change and no tenderness. Breasts are symmetrical.  Abdominal: Soft. Normal appearance and bowel sounds are normal. She exhibits no distension, no abdominal bruit, no pulsatile midline mass and no mass. There is no hepatosplenomegaly. There is no tenderness. There is no guarding and no CVA tenderness.  Genitourinary:  Deferred.  Musculoskeletal:       Cervical back: Normal.       Thoracic back: Normal.       Lumbar back: Normal.  Remainder of exam unremarkable; major joints w/o deformities.  Lymphadenopathy:       Head (right side): No submental, no submandibular, no tonsillar, no preauricular, no posterior auricular and no occipital adenopathy present.       Head  (left side): No submental, no submandibular, no tonsillar, no preauricular, no posterior auricular and no occipital adenopathy present.    She has no cervical adenopathy.    She has no axillary adenopathy.       Right: No inguinal and no supraclavicular adenopathy present.       Left: No inguinal and no supraclavicular adenopathy present.  Neurological: She is alert and oriented to person, place, and time. She has normal strength. She displays no atrophy. No cranial nerve deficit or sensory deficit. She exhibits normal muscle tone. She displays a negative Romberg sign. Coordination and gait normal. She displays no Babinski's sign on the right side. She displays no Babinski's sign on the  left side.  Reflex Scores:      Tricep reflexes are 1+ on the right side and 1+ on the left side.      Bicep reflexes are 1+ on the right side and 1+ on the left side.      Brachioradialis reflexes are 1+ on the right side and 1+ on the left side.      Patellar reflexes are 1+ on the right side and 1+ on the left side. Skin: Skin is warm, dry and intact. Lesion noted. No ecchymosis and no rash noted. She is not diaphoretic. No cyanosis or erythema.  Posterior scalp- small dark papule c/w inflamed hair follicle. Receding hair line and thinning at crown.  Psychiatric: She has a normal mood and affect. Her speech is normal and behavior is normal. Judgment and thought content normal. Cognition and memory are normal.    Results for orders placed or performed in visit on 02/23/15  POCT glycosylated hemoglobin (Hb A1C)  Result Value Ref Range   Hemoglobin A1C 6.3        Assessment & Plan:  Annual physical exam - Plan: COMPLETE METABOLIC PANEL WITH GFR, Lipid panel, CBC with Differential/Platelet, POCT glycosylated hemoglobin (Hb A1C), Pneumococcal conjugate vaccine 13-valent IM, IFOBT POC (occult bld, rslt in office)  Type 2 diabetes mellitus without complication - Improvement on current medication; continue w/  healthy lifestyle changes. Plan: POCT glycosylated hemoglobin (Hb A1C)  Essential hypertension, benign - Stable on lisinopril- hctz  20-12.5 mg 1 tab daily. Plan: COMPLETE METABOLIC PANEL WITH GFR, CBC with Differential/Platelet  Thinning hair - Plan: Ambulatory referral to Dermatology  Skin lesion of scalp - Plan: Ambulatory referral to Dermatology  Meds ordered this encounter  Medications  . glucose blood (ONE TOUCH ULTRA TEST) test strip    Sig: Check blood sugar three times a week.    Dispense:  100 each    Refill:  3  . fluticasone (FLONASE) 50 MCG/ACT nasal spray    Sig: Place 2 sprays into both nostrils daily.    Dispense:  16 g    Refill:  6  . ALPRAZolam (XANAX) 0.5 MG tablet    Sig: Take 1 tablet (0.5 mg total) by mouth 2 (two) times daily as needed. for anxiety    Dispense:  60 tablet    Refill:  2     Screening for colon cancer - Plan: IFOBT POC (occult bld, rslt in office)

## 2015-03-09 ENCOUNTER — Other Ambulatory Visit: Payer: Self-pay | Admitting: Family Medicine

## 2015-04-25 ENCOUNTER — Telehealth: Payer: Self-pay | Admitting: Family Medicine

## 2015-04-25 NOTE — Telephone Encounter (Signed)
lmom to call and reschedule her appt in Aug for a 6 month follow up with any provider

## 2015-05-17 ENCOUNTER — Ambulatory Visit (INDEPENDENT_AMBULATORY_CARE_PROVIDER_SITE_OTHER): Payer: BLUE CROSS/BLUE SHIELD | Admitting: Family Medicine

## 2015-05-17 VITALS — BP 130/90 | HR 99 | Temp 98.8°F | Resp 18 | Wt 216.0 lb

## 2015-05-17 DIAGNOSIS — J069 Acute upper respiratory infection, unspecified: Secondary | ICD-10-CM | POA: Diagnosis not present

## 2015-05-17 MED ORDER — BENZONATATE 200 MG PO CAPS: 200.0000 mg | ORAL_CAPSULE | Freq: Three times a day (TID) | ORAL | Status: DC | PRN

## 2015-05-17 NOTE — Progress Notes (Signed)
Subjective:    Patient ID: Cheryl Morton, female    DOB: April 06, 1956, 59 y.o.   MRN: [id number]   Chief Complaint  Patient presents with  . URI    HPI  Pt w/ PMHx of well-controlled DM and HTN presents w/ 5d of worsening URI sxs - no focal sxs, no f/c.  Just got back from vacation in [place] - felt like she was getting sick on the plan.  Has been using otc meds w/o sig benefit. Is not sleeping well due to cough.  Past Medical History  Diagnosis Date  . Allergy   . Anxiety   . Depression   . Diabetes mellitus without complication   . Glaucoma   . Hypertension    Current Outpatient Prescriptions on File Prior to Visit  Medication Sig Dispense Refill  . ACIPHEX 20 MG tablet TAKE 2 TABLETS BY MOUTH ONCE DAILY 60 tablet 5  . ALPRAZolam (XANAX) 0.5 MG tablet Take 1 tablet (0.5 mg total) by mouth 2 (two) times daily as needed. for anxiety 60 tablet 2  . aspirin 81 MG tablet Take 81 mg by mouth daily.    Marland Kitchen buPROPion (WELLBUTRIN XL) 300 MG 24 hr tablet TAKE 1 TABLET BY MOUTH EVERY DAY 90 tablet 3  . fluticasone (FLONASE) 50 MCG/ACT nasal spray Place 2 sprays into both nostrils daily. 16 g 6  . glucose blood (ONE TOUCH ULTRA TEST) test strip Check blood sugar three times a week. 100 each 3  . hydrocortisone (ANUCORT-HC) 25 MG suppository Place 1 suppository (25 mg total) rectally 2 (two) times daily. 24 suppository 3  . Lancets (ONETOUCH ULTRASOFT) lancets USE AS DIRECTED THREE TIMES PER WEEK 100 each 0  . lisinopril-hydrochlorothiazide (PRINZIDE,ZESTORETIC) 20-12.5 MG per tablet TAKE 2 TABLETS BY MOUTH DAILY 180 tablet 1  . metFORMIN (GLUCOPHAGE) 500 MG tablet TAKE 1 TABLET BY MOUTH TWICE DAILY WITH A MEAL 180 tablet 3  . rosuvastatin (CRESTOR) 10 MG tablet Take 1 tablet (10 mg total) by mouth daily. PATIENT NEEDS OFFICE VISIT FOR ADDITIONAL REFILLS 90 tablet 3  . Travoprost, BAK Free, (TRAVATAN) 0.004 % SOLN ophthalmic solution 1 drop at bedtime. NEED REFILLS     No current  facility-administered medications on file prior to visit.   Allergies  Allergen Reactions  . Biaxin [Clarithromycin] Rash    Review of Systems  Constitutional: Positive for fatigue. Negative for fever, chills, diaphoresis, activity change and appetite change.  HENT: Positive for congestion, postnasal drip, rhinorrhea, sore throat and voice change. Negative for ear pain, hearing loss, sinus pressure and trouble swallowing.   Eyes: Negative for pain, discharge, redness and itching.  Respiratory: Positive for cough. Negative for chest tightness, shortness of breath and wheezing.   Cardiovascular: Negative for chest pain, palpitations and leg swelling.  Skin: Negative for rash.  Neurological: Positive for headaches. Negative for weakness.  Hematological: Negative for adenopathy.  Psychiatric/Behavioral: Positive for sleep disturbance.       Objective:  BP 130/90 mmHg  Pulse 99  Temp(Src) 98.8 F (37.1 C) (Oral)  Resp 18  Wt 216 lb (97.977 kg)  SpO2 96%  Physical Exam  Constitutional: She is oriented to person, place, and time. She appears well-developed and well-nourished. No distress.  HENT:  Head: Normocephalic and atraumatic.  Right Ear: External ear normal.  Left Ear: External ear normal.  Eyes: Conjunctivae are normal. No scleral icterus.  Neck: Normal range of motion. Neck supple. No thyromegaly present.  Cardiovascular: Normal rate, regular rhythm, normal  heart sounds and intact distal pulses.   Pulmonary/Chest: Effort normal and breath sounds normal. No respiratory distress.  Musculoskeletal: She exhibits no edema.  Lymphadenopathy:    She has no cervical adenopathy.  Neurological: She is alert and oriented to person, place, and time.  Skin: Skin is warm and dry. She is not diaphoretic. No erythema.  Psychiatric: She has a normal mood and affect. Her behavior is normal.          Assessment & Plan:   1. Upper respiratory infection   Reviewed symptomatic care  and hand hygiene for suspected viral illness. Call if not improving in 2-3d, rtc if worsening.  Meds ordered this encounter  Medications  . benzonatate (TESSALON) 200 MG capsule    Sig: Take 1 capsule (200 mg total) by mouth 3 (three) times daily as needed for cough.    Dispense:  30 capsule    Refill:  0     Delman Cheadle, MD MPH

## 2015-05-17 NOTE — Progress Notes (Signed)
  Subjective:     Cheryl Morton is a 59 y.o. female who presents for evaluation of symptoms of a URI. Symptoms include congestion, cough (productive), layrnigits.  . Onset of symptoms was 5 days ago, and has been gradually worsening since that time. Treatment to date: Mucinex, Chloracetin.  Denies smoking but did have flu shot   The following portions of the patient's history were reviewed and updated as appropriate: allergies, current medications, past family history, past medical history, past social history, past surgical history and problem list.  Review of Systems Pertinent items are noted in HPI.   Objective:    BP 130/90 mmHg  Pulse 99  Temp(Src) 98.8 F (37.1 C) (Oral)  Resp 18  Wt 216 lb (97.977 kg)  SpO2 96%   Assessment:    viral upper respiratory illness   Plan:    Discussed diagnosis and treatment of URI. Suggested symptomatic OTC remedies. Nasal saline spray for congestion. Follow up as needed.

## 2015-05-17 NOTE — Patient Instructions (Signed)
Upper Respiratory Infection, Adult An upper respiratory infection (URI) is also sometimes known as the common cold. The upper respiratory tract includes the nose, sinuses, throat, trachea, and bronchi. Bronchi are the airways leading to the lungs. Most people improve within 1 week, but symptoms can last up to 2 weeks. A residual cough may last even longer.  CAUSES Many different viruses can infect the tissues lining the upper respiratory tract. The tissues become irritated and inflamed and often become very moist. Mucus production is also common. A cold is contagious. You can easily spread the virus to others by oral contact. This includes kissing, sharing a glass, coughing, or sneezing. Touching your mouth or nose and then touching a surface, which is then touched by another person, can also spread the virus. SYMPTOMS  Symptoms typically develop 1 to 3 days after you come in contact with a cold virus. Symptoms vary from person to person. They may include:  Runny nose.  Sneezing.  Nasal congestion.  Sinus irritation.  Sore throat.  Loss of voice (laryngitis).  Cough.  Fatigue.  Muscle aches.  Loss of appetite.  Headache.  Low-grade fever. DIAGNOSIS  You might diagnose your own cold based on familiar symptoms, since most people get a cold 2 to 3 times a year. Your caregiver can confirm this based on your exam. Most importantly, your caregiver can check that your symptoms are not due to another disease such as strep throat, sinusitis, pneumonia, asthma, or epiglottitis. Blood tests, throat tests, and X-rays are not necessary to diagnose a common cold, but they may sometimes be helpful in excluding other more serious diseases. Your caregiver will decide if any further tests are required. RISKS AND COMPLICATIONS  You may be at risk for a more severe case of the common cold if you smoke cigarettes, have chronic heart disease (such as heart failure) or lung disease (such as asthma), or if  you have a weakened immune system. The very young and very old are also at risk for more serious infections. Bacterial sinusitis, middle ear infections, and bacterial pneumonia can complicate the common cold. The common cold can worsen asthma and chronic obstructive pulmonary disease (COPD). Sometimes, these complications can require emergency medical care and may be life-threatening. PREVENTION  The best way to protect against getting a cold is to practice good hygiene. Avoid oral or hand contact with people with cold symptoms. Wash your hands often if contact occurs. There is no clear evidence that vitamin C, vitamin E, echinacea, or exercise reduces the chance of developing a cold. However, it is always recommended to get plenty of rest and practice good nutrition. TREATMENT  Treatment is directed at relieving symptoms. There is no cure. Antibiotics are not effective, because the infection is caused by a virus, not by bacteria. Treatment may include:  Increased fluid intake. Sports drinks offer valuable electrolytes, sugars, and fluids.  Breathing heated mist or steam (vaporizer or shower).  Eating chicken soup or other clear broths, and maintaining good nutrition.  Getting plenty of rest.  Using gargles or lozenges for comfort.  Controlling fevers with ibuprofen or acetaminophen as directed by your caregiver.  Increasing usage of your inhaler if you have asthma. Zinc gel and zinc lozenges, taken in the first 24 hours of the common cold, can shorten the duration and lessen the severity of symptoms. Pain medicines may help with fever, muscle aches, and throat pain. A variety of non-prescription medicines are available to treat congestion and runny nose. Your caregiver   can make recommendations and may suggest nasal or lung inhalers for other symptoms.  HOME CARE INSTRUCTIONS   Only take over-the-counter or prescription medicines for pain, discomfort, or fever as directed by your  caregiver.  Use a warm mist humidifier or inhale steam from a shower to increase air moisture. This may keep secretions moist and make it easier to breathe.  Drink enough water and fluids to keep your urine clear or pale yellow.  Rest as needed.  Return to work when your temperature has returned to normal or as your caregiver advises. You may need to stay home longer to avoid infecting others. You can also use a face mask and careful hand washing to prevent spread of the virus. SEEK MEDICAL CARE IF:   After the first few days, you feel you are getting worse rather than better.  You need your caregiver's advice about medicines to control symptoms.  You develop chills, worsening shortness of breath, or brown or red sputum. These may be signs of pneumonia.  You develop yellow or brown nasal discharge or pain in the face, especially when you bend forward. These may be signs of sinusitis.  You develop a fever, swollen neck glands, pain with swallowing, or white areas in the back of your throat. These may be signs of strep throat. SEEK IMMEDIATE MEDICAL CARE IF:   You have a fever.  You develop severe or persistent headache, ear pain, sinus pain, or chest pain.  You develop wheezing, a prolonged cough, cough up blood, or have a change in your usual mucus (if you have chronic lung disease).  You develop sore muscles or a stiff neck. Document Released: 06/05/2001 Document Revised: 03/03/2012 Document Reviewed: 03/17/2014 Exit[employer] Patient Information 2015 [employer]. This information is not intended to replace advice given to you by your health care provider. Make sure you discuss any questions you have with your health care provider.  

## 2015-05-19 ENCOUNTER — Telehealth: Payer: Self-pay

## 2015-05-19 MED ORDER — AZITHROMYCIN 250 MG PO TABS: ORAL_TABLET | ORAL | Status: DC

## 2015-05-19 MED ORDER — HYDROCOD POLST-CPM POLST ER 10-8 MG/5ML PO SUER: 5.0000 mL | Freq: Two times a day (BID) | ORAL | Status: DC | PRN

## 2015-05-19 NOTE — Telephone Encounter (Signed)
Still with a lot of cough, non-productive, headache, rhinorrhea. No sig sinus pressure. Temp up to 101 in evenings. Taking mucinex and tessalon but still waking up coughing a lot. Has rash with biaxin but takes zpack without problem in past so will try again.  Offered several different options for cough - pt elects to come pick up rx for tussionex tonight - printed and signed - placed in provider rx folder on wall.  Meds ordered this encounter  Medications  . azithromycin (ZITHROMAX) 250 MG tablet    Sig: Take 2 tabs PO x 1 dose, then 1 tab PO QD x 4 days    Dispense:  6 tablet    Refill:  0  . chlorpheniramine-HYDROcodone (TUSSIONEX PENNKINETIC ER) 10-8 MG/5ML SUER    Sig: Take 5 mLs by mouth every 12 (twelve) hours as needed for cough.    Dispense:  120 mL    Refill:  0

## 2015-05-19 NOTE — Telephone Encounter (Signed)
PATIENT STATES SHE WAS IN THE OFFICE ON TUES. TO BE SEEN FOR A URI. DR. Brigitte Pulse TOLD HER TO CALL BACK IF SHE DID NOT START TO FEEL BETTER IN A FEW DAYS. HER COUGH IS NOT ANY BETTER AND SHE RUNS A FEVER AT NIGHT. SHE WAS GIVEN BENZONATATE WHICH IS NOT REALLY HELPING MUCH. BEST PHONE [phone] (CELL)  PHARMACY CHOICE IS [facility] ON [address].  [place]

## 2015-06-21 ENCOUNTER — Other Ambulatory Visit: Payer: Self-pay | Admitting: Family Medicine

## 2015-06-21 DIAGNOSIS — Z1231 Encounter for screening mammogram for malignant neoplasm of breast: Secondary | ICD-10-CM

## 2015-06-28 ENCOUNTER — Ambulatory Visit (HOSPITAL_COMMUNITY)
Admission: RE | Admit: 2015-06-28 | Discharge: 2015-06-28 | Disposition: A | Discharge status: Home / Self Care | Payer: BLUE CROSS/BLUE SHIELD | Source: Ambulatory Visit | Attending: Family Medicine | Admitting: Family Medicine

## 2015-06-28 DIAGNOSIS — Z1231 Encounter for screening mammogram for malignant neoplasm of breast: Secondary | ICD-10-CM

## 2015-07-27 ENCOUNTER — Other Ambulatory Visit: Payer: Self-pay | Admitting: Family Medicine

## 2015-07-27 ENCOUNTER — Ambulatory Visit: Payer: BLUE CROSS/BLUE SHIELD | Admitting: Family Medicine

## 2015-08-01 ENCOUNTER — Ambulatory Visit (INDEPENDENT_AMBULATORY_CARE_PROVIDER_SITE_OTHER): Payer: BLUE CROSS/BLUE SHIELD | Admitting: Family Medicine

## 2015-08-01 ENCOUNTER — Encounter: Payer: Self-pay | Admitting: Family Medicine

## 2015-08-01 VITALS — BP 116/90 | HR 97 | Temp 98.6°F | Resp 16 | Ht 65.25 in | Wt 211.6 lb

## 2015-08-01 DIAGNOSIS — Z720 Tobacco use: Secondary | ICD-10-CM

## 2015-08-01 DIAGNOSIS — F419 Anxiety disorder, unspecified: Secondary | ICD-10-CM

## 2015-08-01 DIAGNOSIS — I10 Essential (primary) hypertension: Secondary | ICD-10-CM

## 2015-08-01 DIAGNOSIS — F32A Depression, unspecified: Secondary | ICD-10-CM

## 2015-08-01 DIAGNOSIS — J301 Allergic rhinitis due to pollen: Secondary | ICD-10-CM

## 2015-08-01 DIAGNOSIS — F418 Other specified anxiety disorders: Secondary | ICD-10-CM | POA: Diagnosis not present

## 2015-08-01 DIAGNOSIS — E7800 Pure hypercholesterolemia, unspecified: Secondary | ICD-10-CM

## 2015-08-01 DIAGNOSIS — K219 Gastro-esophageal reflux disease without esophagitis: Secondary | ICD-10-CM

## 2015-08-01 DIAGNOSIS — E119 Type 2 diabetes mellitus without complications: Secondary | ICD-10-CM | POA: Diagnosis not present

## 2015-08-01 DIAGNOSIS — F329 Major depressive disorder, single episode, unspecified: Secondary | ICD-10-CM

## 2015-08-01 DIAGNOSIS — F17200 Nicotine dependence, unspecified, uncomplicated: Secondary | ICD-10-CM

## 2015-08-01 LAB — COMPREHENSIVE METABOLIC PANEL
ALK PHOS: 103 U/L (ref 33–130)
ALT: 30 U/L — AB (ref 6–29)
AST: 25 U/L (ref 10–35)
Albumin: 4.2 g/dL (ref 3.6–5.1)
BUN: 15 mg/dL (ref 7–25)
CALCIUM: 10.2 mg/dL (ref 8.6–10.4)
CHLORIDE: 100 mmol/L (ref 98–110)
CO2: 27 mmol/L (ref 20–31)
CREATININE: 0.94 mg/dL (ref 0.50–1.05)
GLUCOSE: 111 mg/dL — AB (ref 65–99)
POTASSIUM: 3.8 mmol/L (ref 3.5–5.3)
Sodium: 142 mmol/L (ref 135–146)
Total Bilirubin: 0.4 mg/dL (ref 0.2–1.2)
Total Protein: 6.8 g/dL (ref 6.1–8.1)

## 2015-08-01 LAB — CBC WITH DIFFERENTIAL/PLATELET
Basophils Absolute: 0 10*3/uL (ref 0.0–0.1)
Basophils Relative: 0 % (ref 0–1)
EOS PCT: 1 % (ref 0–5)
Eosinophils Absolute: 0.1 10*3/uL (ref 0.0–0.7)
HCT: 45.9 % (ref 36.0–46.0)
Hemoglobin: 15.8 g/dL — ABNORMAL HIGH (ref 12.0–15.0)
LYMPHS ABS: 3.4 10*3/uL (ref 0.7–4.0)
Lymphocytes Relative: 30 % (ref 12–46)
MCH: 30.7 pg (ref 26.0–34.0)
MCHC: 34.4 g/dL (ref 30.0–36.0)
MCV: 89.1 fL (ref 78.0–100.0)
MONO ABS: 0.9 10*3/uL (ref 0.1–1.0)
MPV: 9.7 fL (ref 8.6–12.4)
Monocytes Relative: 8 % (ref 3–12)
NEUTROS PCT: 61 % (ref 43–77)
Neutro Abs: 7 10*3/uL (ref 1.7–7.7)
PLATELETS: 301 10*3/uL (ref 150–400)
RBC: 5.15 MIL/uL — ABNORMAL HIGH (ref 3.87–5.11)
RDW: 14 % (ref 11.5–15.5)
WBC: 11.4 10*3/uL — AB (ref 4.0–10.5)

## 2015-08-01 LAB — POCT URINALYSIS DIPSTICK
Bilirubin, UA: NEGATIVE
Blood, UA: NEGATIVE
GLUCOSE UA: NEGATIVE
KETONES UA: NEGATIVE
Nitrite, UA: NEGATIVE
Protein, UA: NEGATIVE
Spec Grav, UA: 1.015
Urobilinogen, UA: 0.2
pH, UA: 6.5

## 2015-08-01 LAB — LIPID PANEL
CHOLESTEROL: 140 mg/dL (ref 125–200)
HDL: 54 mg/dL (ref >=46)
LDL Cholesterol: 33 mg/dL (ref <130)
TRIGLYCERIDES: 267 mg/dL — AB (ref <150)
Total CHOL/HDL Ratio: 2.6 Ratio (ref <=5.0)
VLDL: 53 mg/dL — ABNORMAL HIGH (ref <30)

## 2015-08-01 LAB — HEMOGLOBIN A1C
Hgb A1c MFr Bld: 6.4 % — ABNORMAL HIGH (ref <5.7)
MEAN PLASMA GLUCOSE: 137 mg/dL — AB (ref <117)

## 2015-08-01 MED ORDER — ALPRAZOLAM 0.5 MG PO TABS: 0.5000 mg | ORAL_TABLET | Freq: Two times a day (BID) | ORAL | Status: DC | PRN

## 2015-08-01 NOTE — Patient Instructions (Signed)

## 2015-08-01 NOTE — Progress Notes (Signed)
Subjective:    Patient ID: Cheryl Morton, female    DOB: 29-Apr-1956, 59 y.o.   MRN: [id number]  08/01/2015  estab care; Diabetes; and Medication Refill   HPI This 59 y.o. female presents to establish care. Previous patient of Dr. Leward Quan.  Last physical:  02-23-15 Pap smear: Mammogram:  06-28-15 Colonoscopy: 07-04-2010; repeat in 2021; [facility] Bone density:never TDAP: 2015 Pneumovax: Prevnar 13 2016 Zostavax: never Influenza:  annually   DMII:  Diagnosed once established here.  117 fasting; lowest 110-145.  Metformin bid.  Eye exam UTD.    Anxiety:  Lost mother a while ago; lost associate later; then lost another friend; taking one per day.  Functioning.  Mother passed April 2015.  Last friend passed one year ago.  Taking Wellbutrin daily; started Wellbutrin prior to mother passing.  No SI.  Doing well emotionally.  HTN: Patient reports good compliance with medication, good tolerance to medication, and good symptom control.      Hyperlipidemia: Patient reports good compliance with medication, good tolerance to medication, and good symptom control.    Allergic Rhinitis: Flonase PRN.  GERD: Patient reports good compliance with medication, good tolerance to medication, and good symptom control.   Good control with Aciphex.   Review of Systems  Constitutional: Negative for fever, chills, diaphoresis and fatigue.  Eyes: Negative for visual disturbance.  Respiratory: Negative for cough and shortness of breath.   Cardiovascular: Negative for chest pain, palpitations and leg swelling.  Gastrointestinal: Negative for nausea, vomiting, abdominal pain, diarrhea and constipation.  Endocrine: Negative for cold intolerance, heat intolerance, polydipsia, polyphagia and polyuria.  Neurological: Negative for dizziness, tremors, seizures, syncope, facial asymmetry, speech difficulty, weakness, light-headedness, numbness and headaches.  Psychiatric/Behavioral: Positive for dysphoric mood. Negative  for suicidal ideas, sleep disturbance and self-injury. The patient is nervous/anxious.     Past Medical History  Diagnosis Date  . Allergy   . Anxiety   . Depression   . Diabetes mellitus without complication   . Glaucoma   . Hypertension    Past Surgical History  Procedure Laterality Date  . Abdominal hysterectomy      Fibroids/DUB.  Ovaries intact.   Allergies  Allergen Reactions  . Biaxin [Clarithromycin] Rash   Current Outpatient Prescriptions  Medication Sig Dispense Refill  . ACIPHEX 20 MG tablet Take 2 tablets (40 mg total) by mouth daily. DUE FOR FOLLOW UP IN SEPT. 60 tablet 0  . ALPRAZolam (XANAX) 0.5 MG tablet Take 1 tablet (0.5 mg total) by mouth 2 (two) times daily as needed. for anxiety 60 tablet 5  . aspirin 81 MG tablet Take 81 mg by mouth daily.    Marland Kitchen buPROPion (WELLBUTRIN XL) 300 MG 24 hr tablet TAKE 1 TABLET BY MOUTH EVERY DAY 90 tablet 3  . fluticasone (FLONASE) 50 MCG/ACT nasal spray Place 2 sprays into both nostrils daily. 16 g 6  . glucose blood (ONE TOUCH ULTRA TEST) test strip Check blood sugar three times a week. 100 each 3  . hydrocortisone (ANUCORT-HC) 25 MG suppository Place 1 suppository (25 mg total) rectally 2 (two) times daily. 24 suppository 3  . Lancets (ONETOUCH ULTRASOFT) lancets USE AS DIRECTED THREE TIMES PER WEEK 100 each 0  . lisinopril-hydrochlorothiazide (PRINZIDE,ZESTORETIC) 20-12.5 MG per tablet TAKE 2 TABLETS BY MOUTH DAILY 180 tablet 1  . rosuvastatin (CRESTOR) 10 MG tablet Take 1 tablet (10 mg total) by mouth daily. PATIENT NEEDS OFFICE VISIT FOR ADDITIONAL REFILLS 90 tablet 3  . Travoprost, BAK Free, (TRAVATAN)  0.004 % SOLN ophthalmic solution 1 drop at bedtime. NEED REFILLS    . metFORMIN (GLUCOPHAGE) 500 MG tablet TAKE 1 TABLET (500 MG) BY MOUTH TWICE DAILY WITH A MEAL. 60 tablet 5   No current facility-administered medications for this visit.   Social History   Social History  . Marital Status: Divorced    Spouse Name: N/A    . Number of Children: N/A  . Years of Education: N/A   Occupational History  . Not on file.   Social History Main Topics  . Smoking status: Former Smoker -- 0.25 packs/day    Types: Cigarettes    Quit date: 12/24/2010  . Smokeless tobacco: Never Used  . Alcohol Use: 1.8 oz/week    3 Glasses of wine per week  . Drug Use: No  . Sexual Activity: No   Other Topics Concern  . Not on file   Social History Narrative   Marital status: divorced; not dating; not interested      Children:  None      LIves: alone      Employment: Works for [employer] x 25 years.      Tobacco: none; quit age 77.      Alcohol:  Socially      Exercise:  Sporadic.  Walking some.   Family History  Problem Relation Age of Onset  . Hypertension Mother   . Cancer Mother     lung cancer  . Multiple sclerosis Sister   . Diabetes Maternal Grandmother   . Hypertension Maternal Grandmother   . Arthritis Sister        Objective:    BP 116/90 mmHg  Pulse 97  Temp(Src) 98.6 F (37 C) (Oral)  Resp 16  Ht 5' 5.25" (1.657 m)  Wt 211 lb 9.6 oz (95.981 kg)  BMI 34.96 kg/m2  SpO2 98% Physical Exam  Constitutional: She is oriented to person, place, and time. She appears well-developed and well-nourished. No distress.  HENT:  Head: Normocephalic and atraumatic.  Right Ear: External ear normal.  Left Ear: External ear normal.  Nose: Nose normal.  Mouth/Throat: Oropharynx is clear and moist.  Eyes: Conjunctivae and EOM are normal. Pupils are equal, round, and reactive to light.  Neck: Normal range of motion. Neck supple. Carotid bruit is not present. No thyromegaly present.  Cardiovascular: Normal rate, regular rhythm, normal heart sounds and intact distal pulses.  Exam reveals no gallop and no friction rub.   No murmur heard. Pulmonary/Chest: Effort normal and breath sounds normal. She has no wheezes. She has no rales.  Abdominal: Soft. Bowel sounds are normal. She exhibits no distension  and no mass. There is no tenderness. There is no rebound and no guarding.  Lymphadenopathy:    She has no cervical adenopathy.  Neurological: She is alert and oriented to person, place, and time. No cranial nerve deficit.  Skin: Skin is warm and dry. No rash noted. She is not diaphoretic. No erythema. No pallor.  Psychiatric: She has a normal mood and affect. Her behavior is normal.        Assessment & Plan:   1. Type 2 diabetes mellitus without complication   2. Gastroesophageal reflux disease without esophagitis   3. Essential hypertension, benign   4. TOBACCO ABUSE   5. Pure hypercholesterolemia     1. DMII: controlled; obtain labs; continue Metformin. 2.  GERD: controlled; continue Aciphex. 3.  HTN: controlled; continue current medications; obtain labs. 4.  Tobacco abuse: encourage cessation;  precontemplative. 5.  Hypercholesterolemia: controlled; obtain labs; continue Crestor. 6. Anxiety and depression: controlled; refill of Xanax provided. 7. Allergic Rhintis: controlled with PRN Flonase.   Orders Placed This Encounter  Procedures  . CBC with Differential/Platelet  . Comprehensive metabolic panel    Order Specific Question:  Has the patient fasted?    Answer:  Yes  . Hemoglobin A1c  . Lipid panel    Order Specific Question:  Has the patient fasted?    Answer:  Yes  . Microalbumin, urine  . POCT urinalysis dipstick    Meds ordered this encounter  Medications  . ALPRAZolam (XANAX) 0.5 MG tablet    Sig: Take 1 tablet (0.5 mg total) by mouth 2 (two) times daily as needed. for anxiety    Dispense:  60 tablet    Refill:  5    Return in about 6 months (around 02/01/2016) for complete physical examiniation.     Samanthamarie Ezzell Elayne Guerin, M.D. [facility] [address] [place], [place]  [id number] [phone] phone [phone] fax

## 2015-08-02 LAB — MICROALBUMIN, URINE: Microalb, Ur: 0.5 mg/dL (ref <2.0)

## 2015-08-03 ENCOUNTER — Encounter: Payer: Self-pay | Admitting: Internal Medicine

## 2015-08-05 ENCOUNTER — Telehealth: Payer: Self-pay | Admitting: Family Medicine

## 2015-08-05 NOTE — Telephone Encounter (Signed)
How do we fix this.

## 2015-08-05 NOTE — Telephone Encounter (Signed)
Patient states that she had a mammogram done (and it was not a referral appointment she states) and her insurance company states that it was coded incorrectly and the Dr. Thompson Caul name is attached to this mammogram. What's going on? How can she fix this?   [phone]

## 2015-08-22 ENCOUNTER — Other Ambulatory Visit: Payer: Self-pay | Admitting: Physician Assistant

## 2015-08-23 MED ORDER — FLUTICASONE PROPIONATE 50 MCG/ACT NA SUSP: 2.0000 | Freq: Every day | NASAL | Status: DC

## 2015-08-23 MED ORDER — BUPROPION HCL ER (XL) 300 MG PO TB24: ORAL_TABLET | ORAL | Status: DC

## 2015-08-23 MED ORDER — LISINOPRIL-HYDROCHLOROTHIAZIDE 20-12.5 MG PO TABS: 2.0000 | ORAL_TABLET | Freq: Every day | ORAL | Status: DC

## 2015-08-23 MED ORDER — ACIPHEX 20 MG PO TBEC: 40.0000 mg | DELAYED_RELEASE_TABLET | Freq: Every day | ORAL | Status: DC

## 2015-08-23 MED ORDER — ROSUVASTATIN CALCIUM 10 MG PO TABS: 10.0000 mg | ORAL_TABLET | Freq: Every day | ORAL | Status: DC

## 2015-08-30 ENCOUNTER — Ambulatory Visit (INDEPENDENT_AMBULATORY_CARE_PROVIDER_SITE_OTHER): Payer: BLUE CROSS/BLUE SHIELD | Admitting: Physician Assistant

## 2015-08-30 VITALS — BP 118/88 | HR 104 | Temp 98.3°F | Resp 18 | Ht 65.75 in | Wt 210.4 lb

## 2015-08-30 DIAGNOSIS — R21 Rash and other nonspecific skin eruption: Secondary | ICD-10-CM

## 2015-08-30 MED ORDER — CLOBETASOL PROPIONATE 0.05 % EX CREA: 1.0000 "application " | TOPICAL_CREAM | Freq: Two times a day (BID) | CUTANEOUS | Status: DC | PRN

## 2015-08-30 MED ORDER — METHYLPREDNISOLONE ACETATE 80 MG/ML IJ SUSP
80.0000 mg | Freq: Once | INTRAMUSCULAR | Status: AC
  Administered 2015-08-30: 80 mg via INTRAMUSCULAR

## 2015-08-30 NOTE — Progress Notes (Signed)
08/30/2015 at 3:48 PM  Edit Cheryl Morton / DOB: 09-10-1956 / MRN: [id number]  The patient has HYPERLIPIDEMIA-MIXED; TOBACCO ABUSE; ESSENTIAL HYPERTENSION, BENIGN; GERD; Diabetes mellitus; and Obesity, unspecified on her problem list.  SUBJECTIVE  Cheryl Morton is a 59 y.o. well appearing female with a history of well controlled diabetes presenting for the chief complaint of intensely itchy painless rash that started on her left wrist and has now spread to parts of her chest, her left ankle, and her right elbow. Reports this started after gardening. Denies any new medications and detergents.        She  has a past medical history of Allergy; Anxiety; Depression; Diabetes mellitus without complication; Glaucoma; and Hypertension.    Medications reviewed and updated by myself where necessary, and exist elsewhere in the encounter.   Cheryl Morton is allergic to biaxin. She  reports that she quit smoking about 4 years ago. Her smoking use included Cigarettes. She smoked 0.25 packs per day. She has never used smokeless tobacco. She reports that she drinks about 1.8 oz of alcohol per week. She reports that she does not use illicit drugs. She  reports that she does not engage in sexual activity. The patient  has past surgical history that includes Abdominal hysterectomy.  Her family history includes Arthritis in her sister; Cancer in her mother; Diabetes in her maternal grandmother; Hypertension in her maternal grandmother and mother; Multiple sclerosis in her sister.  Review of Systems  Constitutional: Negative for fever and chills.  Respiratory: Negative for cough and shortness of breath.   Cardiovascular: Negative for chest pain.  Gastrointestinal: Negative for nausea.  Skin: Positive for itching and rash.    OBJECTIVE  Her  height is 5' 5.75" (1.67 m) and weight is 210 lb 6.4 oz (95.437 kg). Her oral temperature is 98.3 F (36.8 C). Her blood pressure is 118/88 and her pulse is 104. Her respiration is 18 and  oxygen saturation is 98%.  The patient's body mass index is 34.22 kg/(m^2).  Physical Exam  Vitals reviewed. Constitutional: She is oriented to person, place, and time. She appears well-developed and well-nourished.  Cardiovascular: Normal rate and regular rhythm.   Respiratory: Effort normal and breath sounds normal.  GI: Soft. Bowel sounds are normal.  Musculoskeletal: Normal range of motion.  Neurological: She is alert and oriented to person, place, and time.  Skin: Skin is warm and dry.     Psychiatric: She has a normal mood and affect.    No results found for this or any previous visit (from the past 24 hour(s)).  ASSESSMENT & PLAN  Cheryl Morton was seen today for rash.  Diagnoses and all orders for this visit:  Rash and nonspecific skin eruption: Will treat for allergic dermatits.  She is a well controlled diabetic and her sugars typically measure in the 100-110 at fasting.  Advised that if her sugar rises to over 300 to contact me at the office.  Advised that if she does not feel better in 4 days to call or come back to the office.   -     methylPREDNISolone acetate (DEPO-MEDROL) injection 80 mg; Inject 1 mL (80 mg total) into the muscle once. -     clobetasol cream (TEMOVATE) 0.05 %; Apply 1 application topically 2 (two) times daily as needed.    The patient was advised to call or come back to clinic if she does not see an improvement in symptoms, or worsens with the above plan.  Philis Fendt, MHS, PA-C [facility] 08/30/2015 3:48 PM

## 2015-09-02 ENCOUNTER — Encounter: Payer: Self-pay | Admitting: Family Medicine

## 2015-09-02 ENCOUNTER — Telehealth: Payer: Self-pay

## 2015-09-02 ENCOUNTER — Ambulatory Visit (INDEPENDENT_AMBULATORY_CARE_PROVIDER_SITE_OTHER): Payer: BLUE CROSS/BLUE SHIELD | Admitting: Family Medicine

## 2015-09-02 VITALS — BP 124/86 | HR 108 | Temp 98.2°F | Resp 16 | Ht 65.5 in | Wt 212.6 lb

## 2015-09-02 DIAGNOSIS — L237 Allergic contact dermatitis due to plants, except food: Secondary | ICD-10-CM

## 2015-09-02 DIAGNOSIS — E119 Type 2 diabetes mellitus without complications: Secondary | ICD-10-CM

## 2015-09-02 DIAGNOSIS — R21 Rash and other nonspecific skin eruption: Secondary | ICD-10-CM

## 2015-09-02 MED ORDER — PREDNISONE 20 MG PO TABS: ORAL_TABLET | ORAL | Status: DC

## 2015-09-02 MED ORDER — RANITIDINE HCL 150 MG PO TABS: 150.0000 mg | ORAL_TABLET | Freq: Two times a day (BID) | ORAL | Status: DC

## 2015-09-02 NOTE — Progress Notes (Signed)
Subjective:    Patient ID: Cheryl Morton, female    DOB: 1956/01/06, 59 y.o.   MRN: [id number]  09/02/2015  Rash   HPI This 58 y.o. female presents for evaluation of rash.  Evaluated on 08/30/15 by Philis Fendt, PA-C.  Diagnosed with allergic dermatitis; s/p Depomedrol 66m IM; rx for topical steroid cream.  Onset of rash after working in the yard.  Had been working in the yard prior to onset of symptoms.  Then yesterday, awoke with numbness of R lip area; then developed rash all over face, neck, arms, upper back. This new rash is very different from rash that she presented with two days ago.  Itching like crazy.  Pulling weeds; had gloves on.  L arm was really bad around wrist area initially; weepy; much improved; drying out on L arm now.  Foearm rash is disappearing. Now has some whelps proximally.   No new medications; no new foods; no new products.  No fever; had headache for two days; no sore throat; no tick bite in past month.  No contacts at home with rash. No mucosal involvement.  No lip swelling/tongue swelling/SOB/throat swelling.  Sugars are running 108, 114.  Has been taking Lisinopril for years.     Review of Systems  Constitutional: Negative for fever, chills, diaphoresis and fatigue.  HENT: Positive for facial swelling. Negative for mouth sores and trouble swallowing.   Respiratory: Negative for shortness of breath, wheezing and stridor.   Genitourinary: Negative for genital sores.  Skin: Positive for rash. Negative for color change, pallor and wound.    Past Medical History  Diagnosis Date  . Allergy   . Anxiety   . Depression   . Diabetes mellitus without complication   . Glaucoma   . Hypertension    Past Surgical History  Procedure Laterality Date  . Abdominal hysterectomy      Fibroids/DUB.  Ovaries intact.   Allergies  Allergen Reactions  . Biaxin [Clarithromycin] Rash   Current Outpatient Prescriptions  Medication Sig Dispense Refill  . ACIPHEX 20 MG tablet  Take 2 tablets (40 mg total) by mouth daily. DUE FOR FOLLOW UP IN SEPT. 90 tablet 1  . ALPRAZolam (XANAX) 0.5 MG tablet Take 1 tablet (0.5 mg total) by mouth 2 (two) times daily as needed. for anxiety 60 tablet 5  . aspirin 81 MG tablet Take 81 mg by mouth daily.    .Marland KitchenbuPROPion (WELLBUTRIN XL) 300 MG 24 hr tablet TAKE 1 TABLET BY MOUTH EVERY DAY 90 tablet 3  . clobetasol cream (TEMOVATE) 07.90% Apply 1 application topically 2 (two) times daily as needed. 30 g 0  . fluticasone (FLONASE) 50 MCG/ACT nasal spray Place 2 sprays into both nostrils daily. 16 g 6  . hydrocortisone (ANUCORT-HC) 25 MG suppository Place 1 suppository (25 mg total) rectally 2 (two) times daily. 24 suppository 3  . lisinopril-hydrochlorothiazide (PRINZIDE,ZESTORETIC) 20-12.5 MG per tablet Take 2 tablets by mouth daily. 180 tablet 1  . metFORMIN (GLUCOPHAGE) 500 MG tablet TAKE 1 TABLET (500 MG) BY MOUTH TWICE DAILY WITH A MEAL. 60 tablet 5  . rosuvastatin (CRESTOR) 10 MG tablet Take 1 tablet (10 mg total) by mouth daily. 90 tablet 3  . Travoprost, BAK Free, (TRAVATAN) 0.004 % SOLN ophthalmic solution 1 drop at bedtime. NEED REFILLS    . glucose blood (ONE TOUCH ULTRA TEST) test strip Check blood sugar three times a week. 100 each 3  . Lancets (ONETOUCH ULTRASOFT) lancets USE AS DIRECTED  THREE TIMES PER WEEK 100 each 0  . predniSONE (DELTASONE) 20 MG tablet Three tablets daily x 2 days then two tablets daily x 5 days then one tablet daily x 5 days 21 tablet 0  . ranitidine (ZANTAC) 150 MG tablet Take 1 tablet (150 mg total) by mouth 2 (two) times daily. 30 tablet 0   No current facility-administered medications for this visit.   Social History   Social History  . Marital Status: Divorced    Spouse Name: N/A  . Number of Children: N/A  . Years of Education: N/A   Occupational History  . Not on file.   Social History Main Topics  . Smoking status: Former Smoker -- 0.25 packs/day    Types: Cigarettes    Quit date:  12/24/2010  . Smokeless tobacco: Never Used  . Alcohol Use: 1.8 oz/week    3 Glasses of wine per week  . Drug Use: No  . Sexual Activity: No   Other Topics Concern  . Not on file   Social History Narrative   Marital status: divorced; not dating; not interested      Children:  None      LIves: alone      Employment: Works for [employer] x 25 years.      Tobacco: none; quit age 75.      Alcohol:  Socially      Exercise:  Sporadic.  Walking some.   Family History  Problem Relation Age of Onset  . Hypertension Mother   . Cancer Mother     lung cancer  . Multiple sclerosis Sister   . Diabetes Maternal Grandmother   . Hypertension Maternal Grandmother   . Arthritis Sister        Objective:    BP 124/86 mmHg  Pulse 108  Temp(Src) 98.2 F (36.8 C) (Oral)  Resp 16  Ht 5' 5.5" (1.664 m)  Wt 212 lb 9.6 oz (96.435 kg)  BMI 34.83 kg/m2 Physical Exam  Constitutional: She is oriented to person, place, and time. She appears well-developed and well-nourished. No distress.  HENT:  Head: Normocephalic and atraumatic.  Right Ear: External ear normal.  Left Ear: External ear normal.  Nose: Nose normal.  Mouth/Throat: Oropharynx is clear and moist.  Eyes: Conjunctivae and EOM are normal. Pupils are equal, round, and reactive to light.  Neck: Normal range of motion. Neck supple. No tracheal deviation present. No thyromegaly present.  Cardiovascular: Normal rate, regular rhythm and normal heart sounds.  Exam reveals no gallop and no friction rub.   No murmur heard. Pulmonary/Chest: Effort normal and breath sounds normal. No stridor. She has no wheezes. She has no rales.  Lymphadenopathy:    She has no cervical adenopathy.  Neurological: She is alert and oriented to person, place, and time.  Skin: Skin is warm and dry. Rash noted. No purpura noted. Rash is maculopapular. Rash is not papular, not nodular, not pustular and not vesicular. She is not diaphoretic. There is  erythema.     Diffuse facial involvement; +anterior chest and upper back involvement; +proximal arm involvement.  No lower leg involvement.  Psychiatric: She has a normal mood and affect. Her behavior is normal.  Nursing note and vitals reviewed.  Results for orders placed or performed in visit on 08/01/15  CBC with Differential/Platelet  Result Value Ref Range   WBC 11.4 (H) 4.0 - 10.5 K/uL   RBC 5.15 (H) 3.87 - 5.11 MIL/uL   Hemoglobin 15.8 (H) 12.0 -  15.0 g/dL   HCT 45.9 36.0 - 46.0 %   MCV 89.1 78.0 - 100.0 fL   MCH 30.7 26.0 - 34.0 pg   MCHC 34.4 30.0 - 36.0 g/dL   RDW 14.0 11.5 - 15.5 %   Platelets 301 150 - 400 K/uL   MPV 9.7 8.6 - 12.4 fL   Neutrophils Relative % 61 43 - 77 %   Neutro Abs 7.0 1.7 - 7.7 K/uL   Lymphocytes Relative 30 12 - 46 %   Lymphs Abs 3.4 0.7 - 4.0 K/uL   Monocytes Relative 8 3 - 12 %   Monocytes Absolute 0.9 0.1 - 1.0 K/uL   Eosinophils Relative 1 0 - 5 %   Eosinophils Absolute 0.1 0.0 - 0.7 K/uL   Basophils Relative 0 0 - 1 %   Basophils Absolute 0.0 0.0 - 0.1 K/uL   Smear Review Criteria for review not met   Comprehensive metabolic panel  Result Value Ref Range   Sodium 142 135 - 146 mmol/L   Potassium 3.8 3.5 - 5.3 mmol/L   Chloride 100 98 - 110 mmol/L   CO2 27 20 - 31 mmol/L   Glucose, Bld 111 (H) 65 - 99 mg/dL   BUN 15 7 - 25 mg/dL   Creat 0.94 0.50 - 1.05 mg/dL   Total Bilirubin 0.4 0.2 - 1.2 mg/dL   Alkaline Phosphatase 103 33 - 130 U/L   AST 25 10 - 35 U/L   ALT 30 (H) 6 - 29 U/L   Total Protein 6.8 6.1 - 8.1 g/dL   Albumin 4.2 3.6 - 5.1 g/dL   Calcium 10.2 8.6 - 10.4 mg/dL  Hemoglobin A1c  Result Value Ref Range   Hgb A1c MFr Bld 6.4 (H) <5.7 %   Mean Plasma Glucose 137 (H) <117 mg/dL  Lipid panel  Result Value Ref Range   Cholesterol 140 125 - 200 mg/dL   Triglycerides 267 (H) <150 mg/dL   HDL 54 >=46 mg/dL   Total CHOL/HDL Ratio 2.6 <=5.0 Ratio   VLDL 53 (H) <30 mg/dL   LDL Cholesterol 33 <130 mg/dL  Microalbumin,  urine  Result Value Ref Range   Microalb, Ur 0.5 <2.0 mg/dL  POCT urinalysis dipstick  Result Value Ref Range   Color, UA yellow    Clarity, UA clear    Glucose, UA neg    Bilirubin, UA neg    Ketones, UA neg    Spec Grav, UA 1.015    Blood, UA neg    pH, UA 6.5    Protein, UA neg    Urobilinogen, UA 0.2    Nitrite, UA neg    Leukocytes, UA small (1+) (A) Negative       Assessment & Plan:   1. Rash and nonspecific skin eruption   2. Poison ivy dermatitis   3. Type 2 diabetes mellitus without complication     1. Rash: New. Separate etiology than poison ivy dermatitis; rx for Prednisone, Zantac provided. To start Zyrtec and Benadryl.  To ED for throat swelling, tongue swelling, etc.  If persists, stop Lisinopril.  Pt expressed understanding. 2.  Poison ivy dermatitis: Improving; separate issue from current/new rash. 3. DMII: stable; continue to monitor sugars closely while maintained on Prednisone.   No orders of the defined types were placed in this encounter.   Meds ordered this encounter  Medications  . predniSONE (DELTASONE) 20 MG tablet    Sig: Three tablets daily x 2 days then two  tablets daily x 5 days then one tablet daily x 5 days    Dispense:  21 tablet    Refill:  0  . ranitidine (ZANTAC) 150 MG tablet    Sig: Take 1 tablet (150 mg total) by mouth 2 (two) times daily.    Dispense:  30 tablet    Refill:  0    No Follow-up on file.   Kylo Gavin Elayne Guerin, M.D. [facility] [address] [place], [place]  [address] [phone] phone [phone] fax

## 2015-09-02 NOTE — Patient Instructions (Signed)
1. Take Prednisone as prescribed. 2.  Start Zyrtec 10mg  one daily. 3. Start Benadryl 25mg  1-2 at bedtime. 4.  Start Zantac/Ranitidine 150mg  one tablet twice daily. 5.  Present to emergency room if anything swells or if you develop shortness of breath.

## 2015-09-02 NOTE — Telephone Encounter (Signed)
Pharm sent fax requesting change to generic Aciphex for cost savings. Tried to call pt, mobile didn't seem to be working properly. Got recording at [facility] # that VM is not set up. Will try again to call and see if pt wants generic.

## 2015-09-02 NOTE — Telephone Encounter (Signed)
Pt CB and reported that she has tried the generic Aciphex previously and it was ineffective. LM at pharm advising them  Pt needs name brand.

## 2015-10-16 NOTE — Progress Notes (Signed)
  Medical screening examination/treatment/procedure(s) were performed by non-physician practitioner and as supervising physician I was immediately available for consultation/collaboration.     

## 2016-01-24 ENCOUNTER — Other Ambulatory Visit: Payer: Self-pay | Admitting: Family Medicine

## 2016-02-10 ENCOUNTER — Other Ambulatory Visit: Payer: Self-pay | Admitting: Family Medicine

## 2016-02-14 ENCOUNTER — Other Ambulatory Visit: Payer: Self-pay | Admitting: Physician Assistant

## 2016-02-27 ENCOUNTER — Encounter: Payer: BLUE CROSS/BLUE SHIELD | Admitting: Family Medicine

## 2016-03-06 ENCOUNTER — Encounter: Payer: Self-pay | Admitting: Family Medicine

## 2016-03-06 ENCOUNTER — Ambulatory Visit (INDEPENDENT_AMBULATORY_CARE_PROVIDER_SITE_OTHER): Payer: BLUE CROSS/BLUE SHIELD | Admitting: Family Medicine

## 2016-03-06 VITALS — BP 135/101 | HR 90 | Temp 98.6°F | Resp 16 | Ht 65.5 in | Wt 216.4 lb

## 2016-03-06 DIAGNOSIS — E7800 Pure hypercholesterolemia, unspecified: Secondary | ICD-10-CM

## 2016-03-06 DIAGNOSIS — Z0000 Encounter for general adult medical examination without abnormal findings: Secondary | ICD-10-CM

## 2016-03-06 DIAGNOSIS — K219 Gastro-esophageal reflux disease without esophagitis: Secondary | ICD-10-CM

## 2016-03-06 DIAGNOSIS — F32A Depression, unspecified: Secondary | ICD-10-CM

## 2016-03-06 DIAGNOSIS — I10 Essential (primary) hypertension: Secondary | ICD-10-CM

## 2016-03-06 DIAGNOSIS — Z114 Encounter for screening for human immunodeficiency virus [HIV]: Secondary | ICD-10-CM

## 2016-03-06 DIAGNOSIS — F418 Other specified anxiety disorders: Secondary | ICD-10-CM

## 2016-03-06 DIAGNOSIS — E119 Type 2 diabetes mellitus without complications: Secondary | ICD-10-CM | POA: Diagnosis not present

## 2016-03-06 DIAGNOSIS — Z23 Encounter for immunization: Secondary | ICD-10-CM | POA: Diagnosis not present

## 2016-03-06 DIAGNOSIS — F17200 Nicotine dependence, unspecified, uncomplicated: Secondary | ICD-10-CM

## 2016-03-06 DIAGNOSIS — F419 Anxiety disorder, unspecified: Secondary | ICD-10-CM

## 2016-03-06 DIAGNOSIS — F329 Major depressive disorder, single episode, unspecified: Secondary | ICD-10-CM

## 2016-03-06 LAB — CBC WITH DIFFERENTIAL/PLATELET
BASOS ABS: 0 10*3/uL (ref 0.0–0.1)
BASOS PCT: 0 % (ref 0–1)
EOS ABS: 0.1 10*3/uL (ref 0.0–0.7)
EOS PCT: 1 % (ref 0–5)
HCT: 44.8 % (ref 36.0–46.0)
Hemoglobin: 15.8 g/dL — ABNORMAL HIGH (ref 12.0–15.0)
LYMPHS ABS: 2.9 10*3/uL (ref 0.7–4.0)
Lymphocytes Relative: 32 % (ref 12–46)
MCH: 31.4 pg (ref 26.0–34.0)
MCHC: 35.3 g/dL (ref 30.0–36.0)
MCV: 89.1 fL (ref 78.0–100.0)
MONOS PCT: 12 % (ref 3–12)
MPV: 9.2 fL (ref 8.6–12.4)
Monocytes Absolute: 1.1 10*3/uL — ABNORMAL HIGH (ref 0.1–1.0)
NEUTROS PCT: 55 % (ref 43–77)
Neutro Abs: 5.1 10*3/uL (ref 1.7–7.7)
PLATELETS: 291 10*3/uL (ref 150–400)
RBC: 5.03 MIL/uL (ref 3.87–5.11)
RDW: 13.5 % (ref 11.5–15.5)
WBC: 9.2 10*3/uL (ref 4.0–10.5)

## 2016-03-06 LAB — POCT URINALYSIS DIP (MANUAL ENTRY)
BILIRUBIN UA: NEGATIVE
BILIRUBIN UA: NEGATIVE
Glucose, UA: NEGATIVE
NITRITE UA: NEGATIVE
PH UA: 6
Protein Ur, POC: NEGATIVE
RBC UA: NEGATIVE
Spec Grav, UA: 1.01
Urobilinogen, UA: 0.2

## 2016-03-06 LAB — COMPREHENSIVE METABOLIC PANEL
ALT: 30 U/L — ABNORMAL HIGH (ref 6–29)
AST: 22 U/L (ref 10–35)
Albumin: 4.4 g/dL (ref 3.6–5.1)
Alkaline Phosphatase: 98 U/L (ref 33–130)
BUN: 16 mg/dL (ref 7–25)
CHLORIDE: 97 mmol/L — AB (ref 98–110)
CO2: 25 mmol/L (ref 20–31)
CREATININE: 0.83 mg/dL (ref 0.50–1.05)
Calcium: 10 mg/dL (ref 8.6–10.4)
GLUCOSE: 121 mg/dL — AB (ref 65–99)
POTASSIUM: 3.9 mmol/L (ref 3.5–5.3)
SODIUM: 137 mmol/L (ref 135–146)
TOTAL PROTEIN: 7.1 g/dL (ref 6.1–8.1)
Total Bilirubin: 0.6 mg/dL (ref 0.2–1.2)

## 2016-03-06 LAB — LIPID PANEL
Cholesterol: 138 mg/dL (ref 125–200)
HDL: 41 mg/dL — ABNORMAL LOW (ref >=46)
LDL Cholesterol: 79 mg/dL (ref <130)
Total CHOL/HDL Ratio: 3.4 Ratio (ref <=5.0)
Triglycerides: 88 mg/dL (ref <150)
VLDL: 18 mg/dL (ref <30)

## 2016-03-06 LAB — MICROALBUMIN, URINE: Microalb, Ur: 1.6 mg/dL

## 2016-03-06 MED ORDER — GLUCOSE BLOOD VI STRP: ORAL_STRIP | Status: DC

## 2016-03-06 MED ORDER — ONETOUCH ULTRASOFT LANCETS MISC: Status: DC

## 2016-03-06 MED ORDER — BUPROPION HCL ER (XL) 300 MG PO TB24: ORAL_TABLET | ORAL | Status: DC

## 2016-03-06 MED ORDER — ROSUVASTATIN CALCIUM 10 MG PO TABS: 10.0000 mg | ORAL_TABLET | Freq: Every day | ORAL | Status: DC

## 2016-03-06 MED ORDER — FLUTICASONE PROPIONATE 50 MCG/ACT NA SUSP: 2.0000 | Freq: Every day | NASAL | Status: DC

## 2016-03-06 MED ORDER — METFORMIN HCL 500 MG PO TABS: 500.0000 mg | ORAL_TABLET | Freq: Two times a day (BID) | ORAL | Status: DC

## 2016-03-06 MED ORDER — ALPRAZOLAM 0.5 MG PO TABS: 0.5000 mg | ORAL_TABLET | Freq: Two times a day (BID) | ORAL | Status: DC | PRN

## 2016-03-06 MED ORDER — ACIPHEX 20 MG PO TBEC: 40.0000 mg | DELAYED_RELEASE_TABLET | Freq: Every day | ORAL | Status: DC

## 2016-03-06 MED ORDER — LISINOPRIL-HYDROCHLOROTHIAZIDE 20-12.5 MG PO TABS: 2.0000 | ORAL_TABLET | Freq: Every day | ORAL | Status: DC

## 2016-03-06 NOTE — Patient Instructions (Addendum)
   IF you received an x-ray today, you will receive an invoice from [facility]. Please contact [facility] at [phone] with questions or concerns regarding your invoice.   IF you received labwork today, you will receive an invoice from [facility]. Please contact [facility] at [phone] with questions or concerns regarding your invoice.   Our billing staff will not be able to assist you with questions regarding bills from these companies.  You will be contacted with the lab results as soon as they are available. The fastest way to get your results is to activate your My Chart account. Instructions are located on the last page of this paperwork. If you have not heard from us regarding the results in 2 weeks, please contact this office.    Keeping You Healthy  Get These Tests  Blood Pressure- Have your blood pressure checked by your healthcare provider at least once a year.  Normal blood pressure is 120/80.  Weight- Have your body mass index (BMI) calculated to screen for obesity.  BMI is a measure of body fat based on height and weight.  You can calculate your own BMI at www.nhlbisupport.com/bmi/  Cholesterol- Have your cholesterol checked every year.  Diabetes- Have your blood sugar checked every year if you have high blood pressure, high cholesterol, a family history of diabetes or if you are overweight.  Pap Test - Have a pap test every 1 to 5 years if you have been sexually active.  If you are older than 65 and recent pap tests have been normal you may not need additional pap tests.  In addition, if you have had a hysterectomy  for benign disease additional pap tests are not necessary.  Mammogram-Yearly mammograms are essential for early detection of breast cancer  Screening for Colon Cancer- Colonoscopy starting at age 50. Screening may begin sooner depending on your family history and other health conditions.  Follow up colonoscopy  as directed by your Gastroenterologist.  Screening for Osteoporosis- Screening begins at age 65 with bone density scanning, sooner if you are at higher risk for developing Osteoporosis.  Get these medicines  Calcium with Vitamin D- Your body requires 1200-1500 mg of Calcium a day and 800-1000 IU of Vitamin D a day.  You can only absorb 500 mg of Calcium at a time therefore Calcium must be taken in 2 or 3 separate doses throughout the day.  Hormones- Hormone therapy has been associated with increased risk for certain cancers and heart disease.  Talk to your healthcare provider about if you need relief from menopausal symptoms.  Aspirin- Ask your healthcare provider about taking Aspirin to prevent Heart Disease and Stroke.  Get these Immuniztions  Flu shot- Every fall  Pneumonia shot- Once after the age of 65; if you are younger ask your healthcare provider if you need a pneumonia shot.  Tetanus- Every ten years.  Zostavax- Once after the age of 60 to prevent shingles.  Take these steps  Don't smoke- Your healthcare provider can help you quit. For tips on how to quit, ask your healthcare provider or go to www.smokefree.gov or call 1-800 QUIT-NOW.  Be physically active- Exercise 5 days a week for a minimum of 30 minutes.  If you are not already physically active, start slow and gradually work up to 30 minutes of moderate physical activity.  Try walking, dancing, bike riding, swimming, etc.  Eat a healthy diet- Eat a variety of healthy foods such as fruits, vegetables, whole   grains, low fat milk, low fat cheeses, yogurt, lean meats, chicken, fish, eggs, dried beans, tofu, etc.  For more information go to www.thenutritionsource.org  Dental visit- Brush and floss teeth twice daily; visit your dentist twice a year.  Eye exam- Visit your Optometrist or Ophthalmologist yearly.  Drink alcohol in moderation- Limit alcohol intake to one drink or less a day.  Never drink and  drive.  Depression- Your emotional health is as important as your physical health.  If you're feeling down or losing interest in things you normally enjoy, please talk to your healthcare provider.  Seat Belts- can save your life; always wear one  Smoke/Carbon Monoxide detectors- These detectors need to be installed on the appropriate level of your home.  Replace batteries at least once a year.  Violence- If anyone is threatening or hurting you, please tell your healthcare provider.  Living Will/ Health care power of attorney- Discuss with your healthcare provider and family. 

## 2016-03-06 NOTE — Progress Notes (Signed)
Subjective:    Patient ID: Cheryl Morton, female    DOB: March 10, 1956, 60 y.o.   MRN: [id number]  03/06/2016  Annual Exam   HPI This 60 y.o. female presents for Complete Physical Examination.   Last physical:  02-23-15 [facility] Pap smear:  11/28/2011 hysterectomy for fibroids; cervical dysplasia; ovaries intact. Mammogram:  06/29/2015 Colonoscopy:  12/24/2010 [facility]; repeat 2021 Bone density:  never TDAP:  2015 Pneumovax:   02/23/2015 Zostavax:   never Influenza:  10/25/2015 Eye exam:  07/19/2015 Dental exam:  Two weeks ago.    Sugars running 106-107.     Review of Systems  Constitutional: Negative for fever, chills, diaphoresis, activity change, appetite change, fatigue and unexpected weight change.  HENT: Negative for congestion, dental problem, drooling, ear discharge, ear pain, facial swelling, hearing loss, mouth sores, nosebleeds, postnasal drip, rhinorrhea, sinus pressure, sneezing, sore throat, tinnitus, trouble swallowing and voice change.   Eyes: Negative for photophobia, pain, discharge, redness, itching and visual disturbance.  Respiratory: Negative for apnea, cough, choking, chest tightness, shortness of breath, wheezing and stridor.   Cardiovascular: Negative for chest pain, palpitations and leg swelling.  Gastrointestinal: Negative for nausea, vomiting, abdominal pain, diarrhea, constipation, blood in stool, abdominal distention, anal bleeding and rectal pain.  Endocrine: Negative for cold intolerance, heat intolerance, polydipsia, polyphagia and polyuria.  Genitourinary: Negative for dysuria, urgency, frequency, hematuria, flank pain, decreased urine volume, vaginal bleeding, vaginal discharge, enuresis, difficulty urinating, genital sores, vaginal pain, menstrual problem, pelvic pain and dyspareunia.  Musculoskeletal: Negative for myalgias, back pain, joint swelling, arthralgias, gait problem, neck pain and neck stiffness.  Skin: Negative for color change, pallor, rash and  wound.  Allergic/Immunologic: Negative for environmental allergies, food allergies and immunocompromised state.  Neurological: Negative for dizziness, tremors, seizures, syncope, facial asymmetry, speech difficulty, weakness, light-headedness, numbness and headaches.  Hematological: Negative for adenopathy. Does not bruise/bleed easily.  Psychiatric/Behavioral: Negative for suicidal ideas, hallucinations, behavioral problems, confusion, sleep disturbance, self-injury, dysphoric mood, decreased concentration and agitation. The patient is not nervous/anxious and is not hyperactive.     Past Medical History  Diagnosis Date  . Allergy   . Anxiety   . Depression   . Diabetes mellitus without complication ([place])   . Glaucoma   . Hypertension    Past Surgical History  Procedure Laterality Date  . Abdominal hysterectomy      Fibroids/DUB.  Ovaries intact.   Allergies  Allergen Reactions  . Biaxin [Clarithromycin] Rash    Social History   Social History  . Marital Status: Divorced    Spouse Name: N/A  . Number of Children: N/A  . Years of Education: N/A   Occupational History  . Camera operator    Social History Main Topics  . Smoking status: Former Smoker -- 0.25 packs/day    Types: Cigarettes    Quit date: 12/24/2010  . Smokeless tobacco: Never Used  . Alcohol Use: 1.8 oz/week    3 Glasses of wine per week     Comment: wine  . Drug Use: No  . Sexual Activity: No   Other Topics Concern  . Not on file   Social History Narrative   Marital status: divorced; not dating; not interested      Children:  None      LIves: alone      Employment: Works for [employer] x 25 years.      Tobacco: none; quit age 15.      Alcohol:  Socially; 3-4 times per  week; wine one glass each episode      Exercise:  Sporadic.  Walking some.      Seatbelt: 100%; never texting while driving   Family History  Problem Relation Age of Onset  . Hypertension Mother   . Cancer  Mother     lung cancer  . Multiple sclerosis Sister   . Diabetes Maternal Grandmother   . Hypertension Maternal Grandmother   . Arthritis Sister   . Mental retardation Sister        Objective:    BP 135/101 mmHg  Pulse 90  Temp(Src) 98.6 F (37 C) (Oral)  Resp 16  Ht 5' 5.5" (1.664 m)  Wt 216 lb 6.4 oz (98.158 kg)  BMI 35.45 kg/m2  SpO2 96% Physical Exam  Constitutional: She is oriented to person, place, and time. She appears well-developed and well-nourished. No distress.  HENT:  Head: Normocephalic and atraumatic.  Right Ear: External ear normal.  Left Ear: External ear normal.  Nose: Nose normal.  Mouth/Throat: Oropharynx is clear and moist.  Eyes: Conjunctivae and EOM are normal. Pupils are equal, round, and reactive to light.  Neck: Normal range of motion and full passive range of motion without pain. Neck supple. No JVD present. Carotid bruit is not present. No thyromegaly present.  Cardiovascular: Normal rate, regular rhythm and normal heart sounds.  Exam reveals no gallop and no friction rub.   No murmur heard. Pulmonary/Chest: Effort normal and breath sounds normal. She has no wheezes. She has no rales.  Abdominal: Soft. Bowel sounds are normal. She exhibits no distension and no mass. There is no tenderness. There is no rebound and no guarding.  Musculoskeletal:       Right shoulder: Normal.       Left shoulder: Normal.       Cervical back: Normal.  Lymphadenopathy:    She has no cervical adenopathy.  Neurological: She is alert and oriented to person, place, and time. She has normal reflexes. No cranial nerve deficit. She exhibits normal muscle tone. Coordination normal.  Skin: Skin is warm and dry. No rash noted. She is not diaphoretic. No erythema. No pallor.  Psychiatric: She has a normal mood and affect. Her behavior is normal. Judgment and thought content normal.  Nursing note and vitals reviewed.       Assessment & Plan:   1. Routine physical  examination   2. Essential hypertension, benign   3. Type 2 diabetes mellitus without complication, without long-term current use of insulin ([place])   4. TOBACCO ABUSE   5. Gastroesophageal reflux disease without esophagitis   6. Pure hypercholesterolemia   7. Screening for HIV (human immunodeficiency virus)   8. Anxiety and depression   9. Need for prophylactic vaccination against Streptococcus pneumoniae (pneumococcus)     Orders Placed This Encounter  Procedures  . Pneumococcal polysaccharide vaccine 23-valent greater than or equal to 2yo subcutaneous/IM  . CBC with Differential/Platelet  . Comprehensive metabolic panel    Order Specific Question:  Has the patient fasted?    Answer:  Yes  . Hemoglobin A1c  . HIV antibody  . Lipid panel    Order Specific Question:  Has the patient fasted?    Answer:  Yes  . TSH  . Microalbumin, urine  . VITAMIN D 25 Hydroxy (Vit-D Deficiency, Fractures)  . Vitamin B12  . POCT urinalysis dipstick  . EKG 12-Lead   Meds ordered this encounter  Medications  . rosuvastatin (CRESTOR) 10 MG tablet  Sig: Take 1 tablet (10 mg total) by mouth daily.    Dispense:  90 tablet    Refill:  3  . metFORMIN (GLUCOPHAGE) 500 MG tablet    Sig: Take 1 tablet (500 mg total) by mouth 2 (two) times daily with a meal.    Dispense:  180 tablet    Refill:  3  . lisinopril-hydrochlorothiazide (PRINZIDE,ZESTORETIC) 20-12.5 MG tablet    Sig: Take 2 tablets by mouth daily.    Dispense:  180 tablet    Refill:  3  . Lancets (ONETOUCH ULTRASOFT) lancets    Sig: Check sugar once daily    Dispense:  100 each    Refill:  3  . glucose blood (ONE TOUCH ULTRA TEST) test strip    Sig: Check blood sugar three times a week.    Dispense:  100 each    Refill:  3  . fluticasone (FLONASE) 50 MCG/ACT nasal spray    Sig: Place 2 sprays into both nostrils daily.    Dispense:  48 g    Refill:  6  . buPROPion (WELLBUTRIN XL) 300 MG 24 hr tablet    Sig: TAKE 1 TABLET BY MOUTH  EVERY DAY    Dispense:  90 tablet    Refill:  3  . ALPRAZolam (XANAX) 0.5 MG tablet    Sig: Take 1 tablet (0.5 mg total) by mouth 2 (two) times daily as needed. for anxiety    Dispense:  60 tablet    Refill:  5  . ACIPHEX 20 MG tablet    Sig: Take 2 tablets (40 mg total) by mouth daily.    Dispense:  180 tablet    Refill:  2    Return in about 6 months (around 09/06/2016) for recheck high blood pressure, high cholesterol, diabetes.    Deztinee Lohmeyer Elayne Guerin, M.D. [facility] [address] [place], [place]  [address] [phone] phone [phone] fax

## 2016-03-07 LAB — VITAMIN B12: VITAMIN B 12: 653 pg/mL (ref 200–1100)

## 2016-03-07 LAB — VITAMIN D 25 HYDROXY (VIT D DEFICIENCY, FRACTURES): VIT D 25 HYDROXY: 56 ng/mL (ref 30–100)

## 2016-03-07 LAB — HEMOGLOBIN A1C
Hgb A1c MFr Bld: 6.6 % — ABNORMAL HIGH
Mean Plasma Glucose: 143 mg/dL — ABNORMAL HIGH

## 2016-03-07 LAB — TSH: TSH: 0.63 m[IU]/L

## 2016-03-07 LAB — HIV ANTIBODY (ROUTINE TESTING W REFLEX): HIV 1&2 Ab, 4th Generation: NONREACTIVE

## 2016-03-12 ENCOUNTER — Telehealth: Payer: Self-pay

## 2016-03-12 MED ORDER — GLUCOSE BLOOD VI STRP: ORAL_STRIP | Status: DC

## 2016-03-12 MED ORDER — BAYER CONTOUR MONITOR DEVI: Status: AC

## 2016-03-12 NOTE — Telephone Encounter (Signed)
Pharm sent PA for test strips. I believe [employer] covers the [employer] brand. I will send in Rxs for meter and strips.

## 2016-04-02 DIAGNOSIS — F4323 Adjustment disorder with mixed anxiety and depressed mood: Secondary | ICD-10-CM | POA: Diagnosis not present

## 2016-05-01 ENCOUNTER — Ambulatory Visit (INDEPENDENT_AMBULATORY_CARE_PROVIDER_SITE_OTHER): Payer: BLUE CROSS/BLUE SHIELD | Admitting: Family Medicine

## 2016-05-01 ENCOUNTER — Encounter: Payer: Self-pay | Admitting: Family Medicine

## 2016-05-01 VITALS — BP 120/80 | HR 89 | Temp 98.8°F | Resp 16 | Ht 65.5 in | Wt 215.2 lb

## 2016-05-01 DIAGNOSIS — F418 Other specified anxiety disorders: Secondary | ICD-10-CM | POA: Diagnosis not present

## 2016-05-01 DIAGNOSIS — F419 Anxiety disorder, unspecified: Principal | ICD-10-CM

## 2016-05-01 DIAGNOSIS — F32A Depression, unspecified: Secondary | ICD-10-CM

## 2016-05-01 DIAGNOSIS — F329 Major depressive disorder, single episode, unspecified: Secondary | ICD-10-CM

## 2016-05-01 MED ORDER — ESCITALOPRAM OXALATE 10 MG PO TABS: 10.0000 mg | ORAL_TABLET | Freq: Every day | ORAL | Status: DC

## 2016-05-01 MED ORDER — BUPROPION HCL ER (XL) 150 MG PO TB24: ORAL_TABLET | ORAL | Status: DC

## 2016-05-01 NOTE — Progress Notes (Signed)
Subjective:    Patient ID: Cheryl Morton, female    DOB: 10-27-1956, 60 y.o.   MRN: [id number]  05/01/2016  medication review; Anxiety; and depression scale during triage   HPI This 60 y.o. female presents for medication review for depression and anxiety.    Never cries.  No motivation.  Dreads going to work; considers staying in bed.  No missed work.  Trying to exercise; joined hot yoga; trying to find things to get out of house.  Anxiety is horrible right now; feels like hueart is going to beat out of chest. Panic; not sure about panic attakcs.  Anxiety occurs bridge.  Hates bridges.  Also happens with people and situations at work.  Home is safe place.  Not accomplishing anyting.  Similar episode in the past. Switched medications in the past.  Wellbutrin XL 300mg  daily.  Increased to 300mg  and was a nightmare; was numb.  Has been on 300mg  for years.  Did not like the transition.  Couldn't talk for a few weeks; two weeks.  Has taken one Xanax per day; does not want to take  Per day.  Has taken zoloft (weight gain) and Wellbutrin separately.  No previous prozac, citalopram, lexapro, cymbalta, effexor.  Has a therapist; talking about therapist; diagnosis with depression.  No SI/HI. No pleasure.  Every 2-3 weeks.  Review of Systems  Constitutional: Negative for fever, chills, diaphoresis and fatigue.  Eyes: Negative for visual disturbance.  Respiratory: Negative for cough and shortness of breath.   Cardiovascular: Negative for chest pain, palpitations and leg swelling.  Gastrointestinal: Negative for nausea, vomiting, abdominal pain, diarrhea and constipation.  Endocrine: Negative for cold intolerance, heat intolerance, polydipsia, polyphagia and polyuria.  Neurological: Negative for dizziness, tremors, seizures, syncope, facial asymmetry, speech difficulty, weakness, light-headedness, numbness and headaches.  Psychiatric/Behavioral: Positive for sleep disturbance and dysphoric mood. Negative for  suicidal ideas and self-injury. The patient is nervous/anxious.     Past Medical History  Diagnosis Date  . Allergy   . Anxiety   . Depression   . Diabetes mellitus without complication ([place])   . Glaucoma   . Hypertension    Past Surgical History  Procedure Laterality Date  . Abdominal hysterectomy      Fibroids/DUB.  Ovaries intact.   Allergies  Allergen Reactions  . Biaxin [Clarithromycin] Rash    Social History   Social History  . Marital Status: Divorced    Spouse Name: N/A  . Number of Children: N/A  . Years of Education: N/A   Occupational History  . Camera operator    Social History Main Topics  . Smoking status: Former Smoker -- 0.25 packs/day    Types: Cigarettes    Quit date: 12/24/2010  . Smokeless tobacco: Never Used  . Alcohol Use: 1.8 oz/week    3 Glasses of wine per week     Comment: wine  . Drug Use: No  . Sexual Activity: No   Other Topics Concern  . Not on file   Social History Narrative   Marital status: divorced; not dating; not interested      Children:  None      LIves: alone      Employment: Works for [employer] x 25 years.      Tobacco: none; quit age 50.      Alcohol:  Socially; 3-4 times per week; wine one glass each episode      Exercise:  Sporadic.  Walking some.  Seatbelt: 100%; never texting while driving   Family History  Problem Relation Age of Onset  . Hypertension Mother   . Cancer Mother     lung cancer  . Multiple sclerosis Sister   . Diabetes Maternal Grandmother   . Hypertension Maternal Grandmother   . Arthritis Sister   . Mental retardation Sister        Objective:    BP 120/80 mmHg  Pulse 89  Temp(Src) 98.8 F (37.1 C) (Oral)  Resp 16  Ht 5' 5.5" (1.664 m)  Wt 215 lb 3.2 oz (97.614 kg)  BMI 35.25 kg/m2  SpO2 97% Physical Exam  Constitutional: She is oriented to person, place, and time. She appears well-developed and well-nourished. No distress.  HENT:  Head:  Normocephalic and atraumatic.  Right Ear: External ear normal.  Left Ear: External ear normal.  Nose: Nose normal.  Mouth/Throat: Oropharynx is clear and moist.  Eyes: Conjunctivae and EOM are normal. Pupils are equal, round, and reactive to light.  Neck: Normal range of motion. Neck supple. Carotid bruit is not present. No thyromegaly present.  Cardiovascular: Normal rate, regular rhythm, normal heart sounds and intact distal pulses.  Exam reveals no gallop and no friction rub.   No murmur heard. Pulmonary/Chest: Effort normal and breath sounds normal. She has no wheezes. She has no rales.  Abdominal: Soft. Bowel sounds are normal. She exhibits no distension and no mass. There is no tenderness. There is no rebound and no guarding.  Lymphadenopathy:    She has no cervical adenopathy.  Neurological: She is alert and oriented to person, place, and time. No cranial nerve deficit.  Skin: Skin is warm and dry. No rash noted. She is not diaphoretic. No erythema. No pallor.  Psychiatric: She has a normal mood and affect. Her behavior is normal.        Assessment & Plan:   1. Anxiety and depression    -worsening. -add Lexapro; decrease Wellbutrin. -continue psychotherapy. -counseling provided; 25 minute face to face encounter provided with 50% of time dedicated to coordination of care and counseling. -contracted safety.  No orders of the defined types were placed in this encounter.   Meds ordered this encounter  Medications  . buPROPion (WELLBUTRIN XL) 150 MG 24 hr tablet    Sig: TAKE 1 TABLET BY MOUTH EVERY DAY    Dispense:  90 tablet    Refill:  1  . escitalopram (LEXAPRO) 10 MG tablet    Sig: Take 1 tablet (10 mg total) by mouth at bedtime.    Dispense:  90 tablet    Refill:  1    No Follow-up on file.    Caydon Feasel Elayne Guerin, M.D. [facility] [address] [place],   [address] [phone] phone [phone] fax

## 2016-05-01 NOTE — Patient Instructions (Addendum)
IF you received an x-ray today, you will receive an invoice from [facility]. Please contact [facility] at [phone] with questions or concerns regarding your invoice.   IF you received labwork today, you will receive an invoice from [employer]. Please contact [facility] at [phone] with questions or concerns regarding your invoice.   Our billing staff will not be able to assist you with questions regarding bills from these companies.  You will be contacted with the lab results as soon as they are available. The fastest way to get your results is to activate your My Chart account. Instructions are located on the last page of this paperwork. If you have not heard from [name] regarding the results in 2 weeks, please contact this office.    Generalized Anxiety Disorder Generalized anxiety disorder (GAD) is a mental disorder. It interferes with life functions, including relationships, work, and school. GAD is different from normal anxiety, which everyone experiences at some point in their lives in response to specific life events and activities. Normal anxiety actually helps [name] prepare for and get through these life events and activities. Normal anxiety goes away after the event or activity is over.  GAD causes anxiety that is not necessarily related to specific events or activities. It also causes excess anxiety in proportion to specific events or activities. The anxiety associated with GAD is also difficult to control. GAD can vary from mild to severe. People with severe GAD can have intense waves of anxiety with physical symptoms (panic attacks).  SYMPTOMS The anxiety and worry associated with GAD are difficult to control. This anxiety and worry are related to many life events and activities and also occur more days than not for 6 months or longer. People with GAD also have three or more of the following symptoms (one or more in  children):  Restlessness.   Fatigue.  Difficulty concentrating.   Irritability.  Muscle tension.  Difficulty sleeping or unsatisfying sleep. DIAGNOSIS GAD is diagnosed through an assessment by your health care provider. Your health care provider will ask you questions aboutyour mood,physical symptoms, and events in your life. Your health care provider may ask you about your medical history and use of alcohol or drugs, including prescription medicines. Your health care provider may also do a physical exam and blood tests. Certain medical conditions and the use of certain substances can cause symptoms similar to those associated with GAD. Your health care provider may refer you to a mental health specialist for further evaluation. TREATMENT The following therapies are usually used to treat GAD:   Medication. Antidepressant medication usually is prescribed for long-term daily control. Antianxiety medicines may be added in severe cases, especially when panic attacks occur.   Talk therapy (psychotherapy). Certain types of talk therapy can be helpful in treating GAD by providing support, education, and guidance. A form of talk therapy called cognitive behavioral therapy can teach you healthy ways to think about and react to daily life events and activities.  Stress managementtechniques. These include yoga, meditation, and exercise and can be very helpful when they are practiced regularly. A mental health specialist can help determine which treatment is best for you. Some people see improvement with one therapy. However, other people require a combination of therapies.   This information is not intended to replace advice given to you by your health care provider. Make sure you discuss any questions you have with your health care provider.   Document Released: 04/06/2013 Document  Revised: 12/31/2014 Document Reviewed: 04/06/2013 Elsevier Interactive Patient Education [employer].

## 2016-05-16 DIAGNOSIS — F4323 Adjustment disorder with mixed anxiety and depressed mood: Secondary | ICD-10-CM | POA: Diagnosis not present

## 2016-06-06 DIAGNOSIS — F4323 Adjustment disorder with mixed anxiety and depressed mood: Secondary | ICD-10-CM | POA: Diagnosis not present

## 2016-06-27 ENCOUNTER — Encounter: Payer: Self-pay | Admitting: Family Medicine

## 2016-06-29 DIAGNOSIS — F4323 Adjustment disorder with mixed anxiety and depressed mood: Secondary | ICD-10-CM | POA: Diagnosis not present

## 2016-06-30 ENCOUNTER — Ambulatory Visit (INDEPENDENT_AMBULATORY_CARE_PROVIDER_SITE_OTHER): Payer: BLUE CROSS/BLUE SHIELD | Admitting: Family Medicine

## 2016-06-30 VITALS — BP 92/70 | HR 79 | Temp 98.1°F | Resp 17 | Ht 65.5 in | Wt 211.4 lb

## 2016-06-30 DIAGNOSIS — I10 Essential (primary) hypertension: Secondary | ICD-10-CM | POA: Diagnosis not present

## 2016-06-30 DIAGNOSIS — E119 Type 2 diabetes mellitus without complications: Secondary | ICD-10-CM

## 2016-06-30 DIAGNOSIS — F419 Anxiety disorder, unspecified: Secondary | ICD-10-CM

## 2016-06-30 DIAGNOSIS — F329 Major depressive disorder, single episode, unspecified: Secondary | ICD-10-CM

## 2016-06-30 DIAGNOSIS — F32A Depression, unspecified: Secondary | ICD-10-CM

## 2016-06-30 DIAGNOSIS — G44229 Chronic tension-type headache, not intractable: Secondary | ICD-10-CM | POA: Diagnosis not present

## 2016-06-30 DIAGNOSIS — F418 Other specified anxiety disorders: Secondary | ICD-10-CM | POA: Diagnosis not present

## 2016-06-30 LAB — COMPREHENSIVE METABOLIC PANEL
ALBUMIN: 4.2 g/dL (ref 3.6–5.1)
ALK PHOS: 93 U/L (ref 33–130)
ALT: 26 U/L (ref 6–29)
AST: 21 U/L (ref 10–35)
BILIRUBIN TOTAL: 0.6 mg/dL (ref 0.2–1.2)
BUN: 13 mg/dL (ref 7–25)
CALCIUM: 9.7 mg/dL (ref 8.6–10.4)
CO2: 28 mmol/L (ref 20–31)
Chloride: 102 mmol/L (ref 98–110)
Creat: 0.94 mg/dL (ref 0.50–1.05)
Glucose, Bld: 98 mg/dL (ref 65–99)
POTASSIUM: 4.1 mmol/L (ref 3.5–5.3)
Sodium: 138 mmol/L (ref 135–146)
Total Protein: 6.6 g/dL (ref 6.1–8.1)

## 2016-06-30 LAB — POCT CBC
Granulocyte percent: 55.7 %G (ref 37–80)
HEMATOCRIT: 44.4 % (ref 37.7–47.9)
Hemoglobin: 15.5 g/dL (ref 12.2–16.2)
LYMPH, POC: 2.9 (ref 0.6–3.4)
MCH: 31.2 pg (ref 27–31.2)
MCHC: 34.8 g/dL (ref 31.8–35.4)
MCV: 89.6 fL (ref 80–97)
MID (CBC): 0.9 (ref 0–0.9)
MPV: 7.1 fL (ref 0–99.8)
POC GRANULOCYTE: 4.8 (ref 2–6.9)
POC LYMPH PERCENT: 34 %L (ref 10–50)
POC MID %: 10.3 % (ref 0–12)
Platelet Count, POC: 246 10*3/uL (ref 142–424)
RBC: 4.95 M/uL (ref 4.04–5.48)
RDW, POC: 14.1 %
WBC: 8.6 10*3/uL (ref 4.6–10.2)

## 2016-06-30 LAB — POCT SEDIMENTATION RATE: POCT SED RATE: 6 mm/hr (ref 0–22)

## 2016-06-30 MED ORDER — CITALOPRAM HYDROBROMIDE 20 MG PO TABS: 20.0000 mg | ORAL_TABLET | Freq: Every day | ORAL | Status: DC

## 2016-06-30 NOTE — Progress Notes (Signed)
Subjective:    Patient ID: Cheryl Morton, female    DOB: 08-15-1956, 60 y.o.   MRN: [id number]  06/30/2016  Medication Management (follow-up on depression medication-no improvements noticed. Also causing headaches & insomnia)   HPI This 60 y.o. female presents for follow-up of depression.  Minimal improvement since last visit; feels that Lexapro is causing headaches and insomnia.  Continues to suffer with ongoing sadness/depressive symptoms. Denies SI/HI.  Willing to try a different medication at this time.  Refuses to take Prozac.  Psychological.  Headaches, night sweats, insomnia.  Weight gain with Zoloft.  No previous Cymbalta or Effexor or Celexa.   Review of Systems  Constitutional: Positive for diaphoresis. Negative for chills, fatigue and fever.  HENT: Negative for ear pain, postnasal drip, rhinorrhea, sinus pressure, sore throat and trouble swallowing.   Respiratory: Negative for cough and shortness of breath.   Cardiovascular: Negative for chest pain, palpitations and leg swelling.  Gastrointestinal: Negative for abdominal pain, constipation, diarrhea, nausea and vomiting.  Neurological: Positive for headaches.  Psychiatric/Behavioral: Positive for dysphoric mood and sleep disturbance. Negative for self-injury and suicidal ideas. The patient is nervous/anxious.     Past Medical History:  Diagnosis Date  . Allergy   . Anxiety   . Depression   . Diabetes mellitus without complication ([place])   . Glaucoma   . Hypertension    Past Surgical History:  Procedure Laterality Date  . ABDOMINAL HYSTERECTOMY     Fibroids/DUB.  Ovaries intact.   Allergies  Allergen Reactions  . Biaxin [Clarithromycin] Rash    Social History   Social History  . Marital status: Divorced    Spouse name: N/A  . Number of children: N/A  . Years of education: N/A   Occupational History  . Camera operator    Social History Main Topics  . Smoking status: Former Smoker    Packs/day: 0.25   Types: Cigarettes    Quit date: 12/24/2010  . Smokeless tobacco: Never Used  . Alcohol use 1.8 oz/week    3 Glasses of wine per week     Comment: wine  . Drug use: No  . Sexual activity: No   Other Topics Concern  . Not on file   Social History Narrative   Marital status: divorced; not dating; not interested      Children:  None      LIves: alone      Employment: Works for [employer] x 25 years.      Tobacco: none; quit age 25.      Alcohol:  Socially; 3-4 times per week; wine one glass each episode      Exercise:  Sporadic.  Walking some.      Seatbelt: 100%; never texting while driving   Family History  Problem Relation Age of Onset  . Hypertension Mother   . Cancer Mother     lung cancer  . Multiple sclerosis Sister   . Diabetes Maternal Grandmother   . Hypertension Maternal Grandmother   . Arthritis Sister   . Mental retardation Sister        Objective:    BP 92/70   Pulse 79   Temp 98.1 F (36.7 C) (Oral)   Resp 17   Ht 5' 5.5" (1.664 m)   Wt 211 lb 6 oz (95.9 kg)   SpO2 99%   BMI 34.64 kg/m  Physical Exam  Constitutional: She is oriented to person, place, and time. She appears well-developed and well-nourished.  No distress.  HENT:  Head: Normocephalic and atraumatic.  Right Ear: External ear normal.  Left Ear: External ear normal.  Nose: Nose normal.  Mouth/Throat: Oropharynx is clear and moist.  Eyes: Conjunctivae and EOM are normal. Pupils are equal, round, and reactive to light.  Neck: Normal range of motion. Neck supple. Carotid bruit is not present. No thyromegaly present.  Cardiovascular: Normal rate, regular rhythm, normal heart sounds and intact distal pulses.  Exam reveals no gallop and no friction rub.   No murmur heard. Pulmonary/Chest: Effort normal and breath sounds normal. She has no wheezes. She has no rales.  Abdominal: Soft. Bowel sounds are normal.  Lymphadenopathy:    She has no cervical adenopathy.  Neurological:  She is alert and oriented to person, place, and time. No cranial nerve deficit.  Skin: Skin is warm and dry. No rash noted. She is not diaphoretic. No erythema. No pallor.  Psychiatric: She has a normal mood and affect. Her behavior is normal.        Assessment & Plan:   1. Chronic tension-type headache, not intractable   2. Type 2 diabetes mellitus without complication, without long-term current use of insulin ([place])   3. Anxiety and depression   4. Essential hypertension    -New onset headaches; no concerning features associated with headache; onset with increased dose of Lexapro. -rx for Citalopram 20mg  one tablet daily provided. -wean Lexapro over the upcoming month. -continue Wellbutrin at current dose. -check BP daily for th next week; call if < 100/60.   Orders Placed This Encounter  Procedures  . Comprehensive metabolic panel  . POCT CBC  . POCT SEDIMENTATION RATE   Meds ordered this encounter  Medications  . citalopram (CELEXA) 20 MG tablet    Sig: Take 1 tablet (20 mg total) by mouth daily.    Dispense:  90 tablet    Refill:  1    Return in about 2 months (around 08/31/2016) for recheck.   Kristi Elayne Guerin, M.D. [facility] [address] [place], [place]  [address] [phone] phone [phone] fax

## 2016-06-30 NOTE — Patient Instructions (Signed)
Check blood pressure daily for the next week; call if blood pressure consistently less than 100/60.      IF you received an x-ray today, you will receive an invoice from [facility]. Please contact [facility] at [phone] with questions or concerns regarding your invoice.   IF you received labwork today, you will receive an invoice from [employer]. Please contact [facility] at [phone] with questions or concerns regarding your invoice.   Our billing staff will not be able to assist you with questions regarding bills from these companies.  You will be contacted with the lab results as soon as they are available. The fastest way to get your results is to activate your My Chart account. Instructions are located on the last page of this paperwork. If you have not heard from [name] regarding the results in 2 weeks, please contact this office.

## 2016-07-22 DIAGNOSIS — F32A Depression, unspecified: Secondary | ICD-10-CM | POA: Insufficient documentation

## 2016-07-22 DIAGNOSIS — F329 Major depressive disorder, single episode, unspecified: Secondary | ICD-10-CM | POA: Insufficient documentation

## 2016-07-22 DIAGNOSIS — F419 Anxiety disorder, unspecified: Secondary | ICD-10-CM

## 2016-07-24 DIAGNOSIS — F4323 Adjustment disorder with mixed anxiety and depressed mood: Secondary | ICD-10-CM | POA: Diagnosis not present

## 2016-09-03 ENCOUNTER — Other Ambulatory Visit: Payer: Self-pay | Admitting: Family Medicine

## 2016-09-03 DIAGNOSIS — Z1231 Encounter for screening mammogram for malignant neoplasm of breast: Secondary | ICD-10-CM

## 2016-09-04 ENCOUNTER — Ambulatory Visit (INDEPENDENT_AMBULATORY_CARE_PROVIDER_SITE_OTHER): Payer: BLUE CROSS/BLUE SHIELD | Admitting: Family Medicine

## 2016-09-04 ENCOUNTER — Encounter: Payer: Self-pay | Admitting: Family Medicine

## 2016-09-04 VITALS — BP 122/80 | HR 101 | Temp 98.0°F | Resp 18 | Ht 65.5 in | Wt 210.0 lb

## 2016-09-04 DIAGNOSIS — F4323 Adjustment disorder with mixed anxiety and depressed mood: Secondary | ICD-10-CM | POA: Diagnosis not present

## 2016-09-04 DIAGNOSIS — E119 Type 2 diabetes mellitus without complications: Secondary | ICD-10-CM | POA: Diagnosis not present

## 2016-09-04 DIAGNOSIS — E7800 Pure hypercholesterolemia, unspecified: Secondary | ICD-10-CM | POA: Diagnosis not present

## 2016-09-04 DIAGNOSIS — G4489 Other headache syndrome: Secondary | ICD-10-CM | POA: Diagnosis not present

## 2016-09-04 DIAGNOSIS — Z23 Encounter for immunization: Secondary | ICD-10-CM

## 2016-09-04 DIAGNOSIS — J301 Allergic rhinitis due to pollen: Secondary | ICD-10-CM

## 2016-09-04 DIAGNOSIS — J0100 Acute maxillary sinusitis, unspecified: Secondary | ICD-10-CM

## 2016-09-04 DIAGNOSIS — L219 Seborrheic dermatitis, unspecified: Secondary | ICD-10-CM

## 2016-09-04 DIAGNOSIS — F329 Major depressive disorder, single episode, unspecified: Secondary | ICD-10-CM

## 2016-09-04 DIAGNOSIS — F419 Anxiety disorder, unspecified: Secondary | ICD-10-CM | POA: Diagnosis not present

## 2016-09-04 DIAGNOSIS — F32A Depression, unspecified: Secondary | ICD-10-CM

## 2016-09-04 LAB — CBC WITH DIFFERENTIAL/PLATELET
Basophils Absolute: 0 cells/uL (ref 0–200)
Basophils Relative: 0 %
EOS PCT: 1 %
Eosinophils Absolute: 102 cells/uL (ref 15–500)
HCT: 45.3 % — ABNORMAL HIGH (ref 35.0–45.0)
HEMOGLOBIN: 15.4 g/dL (ref 11.7–15.5)
LYMPHS ABS: 3060 {cells}/uL (ref 850–3900)
LYMPHS PCT: 30 %
MCH: 30.4 pg (ref 27.0–33.0)
MCHC: 34 g/dL (ref 32.0–36.0)
MCV: 89.3 fL (ref 80.0–100.0)
MPV: 9.8 fL (ref 7.5–12.5)
Monocytes Absolute: 1020 cells/uL — ABNORMAL HIGH (ref 200–950)
Monocytes Relative: 10 %
NEUTROS PCT: 59 %
Neutro Abs: 6018 cells/uL (ref 1500–7800)
Platelets: 309 10*3/uL (ref 140–400)
RBC: 5.07 MIL/uL (ref 3.80–5.10)
RDW: 13.7 % (ref 11.0–15.0)
WBC: 10.2 10*3/uL (ref 3.8–10.8)

## 2016-09-04 LAB — LIPID PANEL
CHOLESTEROL: 150 mg/dL (ref 125–200)
HDL: 45 mg/dL — ABNORMAL LOW (ref >=46)
LDL Cholesterol: 76 mg/dL (ref <130)
TRIGLYCERIDES: 147 mg/dL (ref <150)
Total CHOL/HDL Ratio: 3.3 Ratio (ref <=5.0)
VLDL: 29 mg/dL (ref <30)

## 2016-09-04 LAB — COMPREHENSIVE METABOLIC PANEL WITH GFR
ALT: 23 U/L (ref 6–29)
AST: 22 U/L (ref 10–35)
Albumin: 4.3 g/dL (ref 3.6–5.1)
Alkaline Phosphatase: 77 U/L (ref 33–130)
BUN: 15 mg/dL (ref 7–25)
CO2: 26 mmol/L (ref 20–31)
Calcium: 10.2 mg/dL (ref 8.6–10.4)
Chloride: 100 mmol/L (ref 98–110)
Creat: 0.86 mg/dL (ref 0.50–1.05)
Glucose, Bld: 96 mg/dL (ref 65–99)
Potassium: 3.9 mmol/L (ref 3.5–5.3)
Sodium: 135 mmol/L (ref 135–146)
Total Bilirubin: 0.7 mg/dL (ref 0.2–1.2)
Total Protein: 7 g/dL (ref 6.1–8.1)

## 2016-09-04 MED ORDER — ALPRAZOLAM 0.5 MG PO TABS: 0.5000 mg | ORAL_TABLET | Freq: Two times a day (BID) | ORAL | 3 refills | Status: DC | PRN

## 2016-09-04 MED ORDER — DESONIDE 0.05 % EX CREA: TOPICAL_CREAM | Freq: Two times a day (BID) | CUTANEOUS | 0 refills | Status: DC

## 2016-09-04 MED ORDER — AMOXICILLIN-POT CLAVULANATE 875-125 MG PO TABS: 1.0000 | ORAL_TABLET | Freq: Two times a day (BID) | ORAL | 0 refills | Status: DC

## 2016-09-04 MED ORDER — KETOCONAZOLE 2 % EX SHAM: 1.0000 "application " | MEDICATED_SHAMPOO | CUTANEOUS | 0 refills | Status: DC

## 2016-09-04 NOTE — Patient Instructions (Addendum)
1.  Please email me via MyChart with an update on your headache in two weeks.  Seborrheic Dermatitis Seborrheic dermatitis involves pink or red skin with greasy, flaky scales. It usually occurs on the scalp, and it is often called dandruff. This condition may also affect the eyebrows, nose, ears, chest, and the bearded area of men's faces. It often occurs where skin has more oil (sebaceous) glands. It may come and go for no known reason, and it is often long-lasting (chronic). CAUSES The cause is not known. RISK FACTORS This condition is more like to develop in:  People who are stressed or tired.  People who have skin conditions, such as acne.  People who have certain conditions, such as:  HIV (human immunodeficiency virus).  AIDS (acquired immunodeficiency syndrome).  Parkinson disease.  An eating disorder.  Stroke.  Depression.  Epilepsy.  Alcoholism.  People who live in places that have extreme weather.  People who have a family history of seborrheic dermatitis.  People who use skin creams that are made with alcohol.  People who are 73-17 years old.  People who take certain medicines. SYMPTOMS Symptoms of this condition include:  Thick scales on the scalp.  Redness on the face or in the armpits.  Skin that is flaky. The flakes may be white or yellow.  Skin that seems oily or dry but is not helped with moisturizers.  Itching or burning in the affected areas. DIAGNOSIS This condition is diagnosed with a medical history and physical exam. A sample of your skin may be tested (skin biopsy). You may need to see a skin specialist (dermatologist). TREATMENT There is no cure for this condition, but treatment can help to manage the symptoms. Treatment may include:  Cortisone (steroid) ointments, creams, and lotions.  Over-the-counter or prescription shampoos. HOME CARE INSTRUCTIONS  Apply over-the-counter and prescription medicines only as told by your health  care provider.  Keep all follow-up visits as told by your health care provider. This is important.  Try to reduce your stress, such as with yoga or mediation. If you need help to reduce stress, ask your health care provider.  Shower or bathe as told by your health care provider.  Use any medicated shampoos as told by your health care provider. SEEK MEDICAL CARE IF:  Your symptoms do not improve with treatment.  Your symptoms get worse.  You have new symptoms.   This information is not intended to replace advice given to you by your health care provider. Make sure you discuss any questions you have with your health care provider.   Document Released: 12/10/2005 Document Revised: 08/31/2015 Document Reviewed: 04/27/2015 Elsevier Interactive Patient Education 2016 [employer].     IF you received an x-ray today, you will receive an invoice from [facility]. Please contact [facility] at [phone] with questions or concerns regarding your invoice.   IF you received labwork today, you will receive an invoice from [employer]. Please contact [facility] at [phone] with questions or concerns regarding your invoice.   Our billing staff will not be able to assist you with questions regarding bills from these companies.  You will be contacted with the lab results as soon as they are available. The fastest way to get your results is to activate your My Chart account. Instructions are located on the last page of this paperwork. If you have not heard from [name] regarding the results in 2 weeks, please contact this office.

## 2016-09-04 NOTE — Progress Notes (Signed)
Subjective:    Patient ID: Cheryl Morton, female    DOB: 1956/08/20, 60 y.o.   MRN: [id number]  09/04/2016  Follow-up (6 MONTH) and Flu Vaccine   HPI This 60 y.o. female presents for follow-up:  Headache: L sided teeth pain; L sided.  Tried taking Zyrtec for three weeks without any improvement; possibly some improvement. B temples but L side is worse. No pain with brushing teeth.  +nasal congestion; has been using FLonase for three weeks.  Mild improvement. Has happened before due to sinusitis.  Yellow drainage for past two days.  No fever.  No dizziness; no blurred vision.  No nighttime awakening.  Severity 5/10.  There when wakes up.  Then there all day.  Might take something; Advil two.    Depression: Noticed improvement in two weeks after starting Citalopram.  Xanax once daily if needed; if good day, does not take.   Facial rash: vermilion border; nasolabial folds, hair line.  No change in detergents.  No bumps. Scaling. Eye brows are scaling as well. Onset in one week.    Diabetes: Patient reports good compliance with medication, good tolerance to medication, and good symptom control.    HTN: Patient reports good compliance with medication, good tolerance to medication, and good symptom control.    Wt Readings from Last 3 Encounters:  10/06/16 208 lb (94.3 kg)  09/04/16 210 lb (95.3 kg)  06/30/16 211 lb 6 oz (95.9 kg)   BP Readings from Last 3 Encounters:  10/06/16 118/82  09/04/16 122/80  06/30/16 92/70    Review of Systems  Constitutional: Negative for chills, diaphoresis, fatigue and fever.  HENT: Positive for congestion, dental problem, postnasal drip, rhinorrhea and sinus pressure.   Eyes: Negative for visual disturbance.  Respiratory: Negative for cough and shortness of breath.   Cardiovascular: Negative for chest pain, palpitations and leg swelling.  Gastrointestinal: Negative for abdominal pain, constipation, diarrhea, nausea and vomiting.  Endocrine: Negative for  cold intolerance, heat intolerance, polydipsia, polyphagia and polyuria.  Skin: Positive for color change and rash.  Neurological: Positive for headaches. Negative for tremors, seizures, syncope, facial asymmetry, speech difficulty, weakness, light-headedness and numbness.  Psychiatric/Behavioral: Positive for dysphoric mood. Negative for self-injury, sleep disturbance and suicidal ideas. The patient is not nervous/anxious.     Past Medical History:  Diagnosis Date  . Allergy   . Anxiety   . Depression   . Diabetes mellitus without complication ([place])   . Glaucoma   . Hypertension    Past Surgical History:  Procedure Laterality Date  . ABDOMINAL HYSTERECTOMY     Fibroids/DUB.  Ovaries intact.   Allergies  Allergen Reactions  . Biaxin [Clarithromycin] Rash   Current Outpatient Prescriptions  Medication Sig Dispense Refill  . ACIPHEX 20 MG tablet Take 2 tablets (40 mg total) by mouth daily. 180 tablet 2  . ALPRAZolam (XANAX) 0.5 MG tablet Take 1 tablet (0.5 mg total) by mouth 2 (two) times daily as needed. for anxiety (Patient not taking: Reported on 10/06/2016) 60 tablet 3  . aspirin 81 MG tablet Take 81 mg by mouth daily.    . Blood Glucose Monitoring Suppl (CONTOUR BLOOD GLUCOSE SYSTEM) DEVI Use to check blood sugar 3 times a week. Dx code: E11.9. 1 Device 0  . buPROPion (WELLBUTRIN XL) 150 MG 24 hr tablet TAKE 1 TABLET BY MOUTH EVERY DAY 90 tablet 1  . citalopram (CELEXA) 20 MG tablet Take 1 tablet (20 mg total) by mouth daily. 90 tablet 1  .  fluticasone (FLONASE) 50 MCG/ACT nasal spray Place 2 sprays into both nostrils daily. 48 g 6  . glucose blood (ONE TOUCH ULTRA TEST) test strip Check blood sugar three times a week. 100 each 3  . glucose blood test strip Use to check blood sugar 3 times a week. Dx code: E11.9. 50 each 3  . hydrocortisone (ANUCORT-HC) 25 MG suppository Place 1 suppository (25 mg total) rectally 2 (two) times daily. 24 suppository 3  . Lancets (ONETOUCH  ULTRASOFT) lancets Check sugar once daily 100 each 3  . lisinopril-hydrochlorothiazide (PRINZIDE,ZESTORETIC) 20-12.5 MG tablet Take 2 tablets by mouth daily. 180 tablet 3  . metFORMIN (GLUCOPHAGE) 500 MG tablet Take 1 tablet (500 mg total) by mouth 2 (two) times daily with a meal. 180 tablet 3  . rosuvastatin (CRESTOR) 10 MG tablet Take 1 tablet (10 mg total) by mouth daily. 90 tablet 3  . amoxicillin-clavulanate (AUGMENTIN) 875-125 MG tablet Take 1 tablet by mouth 2 (two) times daily. 20 tablet 0  . desonide (DESOWEN) 0.05 % cream Apply topically 2 (two) times daily. 30 g 0  . escitalopram (LEXAPRO) 10 MG tablet Take 1 tablet (10 mg total) by mouth at bedtime. (Patient not taking: Reported on 10/06/2016) 90 tablet 1  . ketoconazole (NIZORAL) 2 % shampoo Apply 1 application topically 2 (two) times a week. 120 mL 0  . sulfamethoxazole-trimethoprim (BACTRIM DS,SEPTRA DS) 800-160 MG tablet Take 1 tablet by mouth 2 (two) times daily. 14 tablet 0  . terconazole (TERAZOL 7) 0.4 % vaginal cream Place 1 applicator vaginally at bedtime. 45 g 0   No current facility-administered medications for this visit.    Social History   Social History  . Marital status: Divorced    Spouse name: N/A  . Number of children: N/A  . Years of education: N/A   Occupational History  . Camera operator    Social History Main Topics  . Smoking status: Former Smoker    Packs/day: 0.25    Types: Cigarettes    Quit date: 12/24/2010  . Smokeless tobacco: Never Used  . Alcohol use 1.8 oz/week    3 Glasses of wine per week     Comment: wine  . Drug use: No  . Sexual activity: No   Other Topics Concern  . Not on file   Social History Narrative   Marital status: divorced; not dating; not interested      Children:  None      LIves: alone      Employment: Works for [employer] x 25 years.      Tobacco: none; quit age 68.      Alcohol:  Socially; 3-4 times per week; wine one glass each episode       Exercise:  Sporadic.  Walking some.      Seatbelt: 100%; never texting while driving   Family History  Problem Relation Age of Onset  . Hypertension Mother   . Cancer Mother     lung cancer  . Multiple sclerosis Sister   . Diabetes Maternal Grandmother   . Hypertension Maternal Grandmother   . Arthritis Sister   . Mental retardation Sister        Objective:    BP 122/80 (BP Location: Right Arm, Patient Position: Sitting, Cuff Size: Small)   Pulse (!) 101   Temp 98 F (36.7 C) (Oral)   Resp 18   Ht 5' 5.5" (1.664 m)   Wt 210 lb (95.3 kg)   SpO2  97%   BMI 34.41 kg/m  Physical Exam  Constitutional: She is oriented to person, place, and time. She appears well-developed and well-nourished. No distress.  HENT:  Head: Normocephalic and atraumatic.  Right Ear: Tympanic membrane, external ear and ear canal normal.  Left Ear: Tympanic membrane, external ear and ear canal normal.  Nose: Mucosal edema and rhinorrhea present. Right sinus exhibits maxillary sinus tenderness. Left sinus exhibits maxillary sinus tenderness.  Mouth/Throat: Uvula is midline and mucous membranes are normal. Posterior oropharyngeal erythema present. No oropharyngeal exudate, posterior oropharyngeal edema or tonsillar abscesses.  Eyes: Conjunctivae are normal. Pupils are equal, round, and reactive to light.  Neck: Normal range of motion. Neck supple.  Cardiovascular: Normal rate, regular rhythm and normal heart sounds.  Exam reveals no gallop and no friction rub.   No murmur heard. Pulmonary/Chest: Effort normal and breath sounds normal. She has no wheezes. She has no rales.  Neurological: She is alert and oriented to person, place, and time.  Skin: She is not diaphoretic.  Psychiatric: She has a normal mood and affect. Her behavior is normal.  Nursing note and vitals reviewed.        Assessment & Plan:   1. Type 2 diabetes mellitus without complication, without long-term current use of insulin ([place])    2. Pure hypercholesterolemia   3. Anxiety and depression   4. Seborrhea   5. Other headache syndrome   6. Acute non-recurrent maxillary sinusitis   7. Acute seasonal allergic rhinitis due to pollen   8. Need for prophylactic vaccination and inoculation against influenza    -controlled; obtain labs; continue current medications. -depression has improved since last visit; continue current medications. -treat headache and sinus congestion as acute sinusitis with Augmentin.  Call if no improvement in headaches with abx. -rx for Ketoconazole shampoo and Desonide cream. Orders Placed This Encounter  Procedures  . Flu Vaccine QUAD 36+ mos IM  . CBC with Differential/Platelet  . Lipid panel    Order Specific Question:   Has the patient fasted?    Answer:   Yes  . Comprehensive metabolic panel    Order Specific Question:   Has the patient fasted?    Answer:   Yes  . Hemoglobin A1c   Meds ordered this encounter  Medications  . amoxicillin-clavulanate (AUGMENTIN) 875-125 MG tablet    Sig: Take 1 tablet by mouth 2 (two) times daily.    Dispense:  20 tablet    Refill:  0  . ketoconazole (NIZORAL) 2 % shampoo    Sig: Apply 1 application topically 2 (two) times a week.    Dispense:  120 mL    Refill:  0  . desonide (DESOWEN) 0.05 % cream    Sig: Apply topically 2 (two) times daily.    Dispense:  30 g    Refill:  0  . ALPRAZolam (XANAX) 0.5 MG tablet    Sig: Take 1 tablet (0.5 mg total) by mouth 2 (two) times daily as needed. for anxiety    Dispense:  60 tablet    Refill:  3    Return in about 6 months (around 03/04/2017) for complete physical examiniation.   Osric Klopf Elayne Guerin, M.D. [facility] [address] [place], [place]  [address] [phone] phone [phone] fax

## 2016-09-05 LAB — HEMOGLOBIN A1C
HEMOGLOBIN A1C: 6 % — AB (ref <5.7)
Mean Plasma Glucose: 126 mg/dL

## 2016-09-12 ENCOUNTER — Ambulatory Visit
Admission: RE | Admit: 2016-09-12 | Discharge: 2016-09-12 | Disposition: A | Discharge status: Home / Self Care | Payer: BLUE CROSS/BLUE SHIELD | Source: Ambulatory Visit | Attending: Family Medicine | Admitting: Family Medicine

## 2016-09-12 DIAGNOSIS — Z1231 Encounter for screening mammogram for malignant neoplasm of breast: Secondary | ICD-10-CM | POA: Diagnosis not present

## 2016-10-04 DIAGNOSIS — F4323 Adjustment disorder with mixed anxiety and depressed mood: Secondary | ICD-10-CM | POA: Diagnosis not present

## 2016-10-06 ENCOUNTER — Encounter: Payer: Self-pay | Admitting: Family Medicine

## 2016-10-06 ENCOUNTER — Ambulatory Visit (INDEPENDENT_AMBULATORY_CARE_PROVIDER_SITE_OTHER): Payer: BLUE CROSS/BLUE SHIELD | Admitting: Family Medicine

## 2016-10-06 VITALS — BP 118/82 | HR 100 | Temp 98.0°F | Resp 17 | Ht 65.5 in | Wt 208.0 lb

## 2016-10-06 DIAGNOSIS — N898 Other specified noninflammatory disorders of vagina: Secondary | ICD-10-CM | POA: Diagnosis not present

## 2016-10-06 DIAGNOSIS — R300 Dysuria: Secondary | ICD-10-CM | POA: Diagnosis not present

## 2016-10-06 LAB — POCT URINALYSIS DIP (MANUAL ENTRY)
BILIRUBIN UA: NEGATIVE
Bilirubin, UA: NEGATIVE
GLUCOSE UA: NEGATIVE
Leukocytes, UA: NEGATIVE
Nitrite, UA: NEGATIVE
Protein Ur, POC: NEGATIVE
SPEC GRAV UA: 1.015
Urobilinogen, UA: 0.2
pH, UA: 6

## 2016-10-06 LAB — POCT WET + KOH PREP
TRICH BY WET PREP: ABSENT
Yeast by KOH: ABSENT
Yeast by wet prep: ABSENT

## 2016-10-06 LAB — POC MICROSCOPIC URINALYSIS (UMFC)

## 2016-10-06 MED ORDER — TERCONAZOLE 0.4 % VA CREA: 1.0000 | TOPICAL_CREAM | Freq: Every day | VAGINAL | 0 refills | Status: DC

## 2016-10-06 MED ORDER — SULFAMETHOXAZOLE-TRIMETHOPRIM 800-160 MG PO TABS: 1.0000 | ORAL_TABLET | Freq: Two times a day (BID) | ORAL | 0 refills | Status: DC

## 2016-10-06 NOTE — Progress Notes (Signed)
Subjective:    Patient ID: Cheryl Morton, female    DOB: 1956-07-20, 60 y.o.   MRN: [id number]  10/06/2016  Dysuria and Urinary Frequency (x3days )   HPI This 60 y.o. female presents for evaluation of dysuria, frequency. If drinks a lot of water, it subsides.  Then took AZO for two days with improvement.  [place] from [place]; it started before left.  Then urinary symptoms started in [place].  No fever/chills; +sweats which is new. +nausea; no vomiting; no diarrhea; no constipation.  +dysuria; +frequncy; +nocturia x 4; baseline nocturia x 1.  Urgency; +urinary incontinence.  No flank pain. Vaginal itching is still there.  No sexual activity. Also has vaginal discharge; keep scratching.    Took antibiotics/Augmentin but got yeast infection; purchased 3 day vaginal suppository.  Headaches resolved with abx.  Headache resolved before going to [place].    Review of Systems  Constitutional: Positive for diaphoresis. Negative for chills, fatigue and fever.  Gastrointestinal: Positive for nausea. Negative for abdominal pain, constipation, diarrhea and vomiting.  Genitourinary: Positive for dysuria, frequency, urgency and vaginal discharge. Negative for decreased urine volume, flank pain, genital sores, hematuria, pelvic pain, vaginal bleeding and vaginal pain.    Past Medical History:  Diagnosis Date  . Allergy   . Anxiety   . Depression   . Diabetes mellitus without complication ([place])   . Glaucoma   . Hypertension    Past Surgical History:  Procedure Laterality Date  . ABDOMINAL HYSTERECTOMY     Fibroids/DUB.  Ovaries intact.   Allergies  Allergen Reactions  . Biaxin [Clarithromycin] Rash    Social History   Social History  . Marital status: Divorced    Spouse name: N/A  . Number of children: N/A  . Years of education: N/A   Occupational History  . Camera operator    Social History Main Topics  . Smoking status: Former Smoker   Packs/day: 0.25    Types: Cigarettes    Quit date: 12/24/2010  . Smokeless tobacco: Never Used  . Alcohol use 1.8 oz/week    3 Glasses of wine per week     Comment: wine  . Drug use: No  . Sexual activity: No   Other Topics Concern  . Not on file   Social History Narrative   Marital status: divorced; not dating; not interested      Children:  None      LIves: alone      Employment: Works for [employer] x 25 years.      Tobacco: none; quit age 52.      Alcohol:  Socially; 3-4 times per week; wine one glass each episode      Exercise:  Sporadic.  Walking some.      Seatbelt: 100%; never texting while driving   Family History  Problem Relation Age of Onset  . Hypertension Mother   . Cancer Mother     lung cancer  . Multiple sclerosis Sister   . Diabetes Maternal Grandmother   . Hypertension Maternal Grandmother   . Arthritis Sister   . Mental retardation Sister        Objective:    BP 118/82 (BP Location: Left Arm, Patient Position: Sitting, Cuff Size: Large)   Pulse 100   Temp 98 F (36.7 C) (Oral)   Resp 17   Ht 5' 5.5" (1.664 m)   Wt 208 lb (94.3 kg)   SpO2  95%   BMI 34.09 kg/m  Physical Exam  Constitutional: She is oriented to person, place, and time. She appears well-developed and well-nourished. No distress.  HENT:  Head: Normocephalic and atraumatic.  Eyes: Conjunctivae are normal. Pupils are equal, round, and reactive to light.  Neck: Normal range of motion. Neck supple.  Cardiovascular: Normal rate, regular rhythm and normal heart sounds.  Exam reveals no gallop and no friction rub.   No murmur heard. Pulmonary/Chest: Effort normal and breath sounds normal. She has no wheezes. She has no rales.  Abdominal: Soft. Bowel sounds are normal. She exhibits no distension and no mass. There is no tenderness. There is no rebound, no guarding and no CVA tenderness.  Genitourinary: There is no rash, tenderness or lesion on the right labia. There is  no rash, tenderness or lesion on the left labia. No erythema, tenderness or bleeding in the vagina. No foreign body in the vagina. Vaginal discharge found.  Genitourinary Comments: Scant yellow discharge in vagina.  Clitoral hood swollen and hypopigmented.  Neurological: She is alert and oriented to person, place, and time.  Skin: Skin is warm and dry. She is not diaphoretic.  Psychiatric: She has a normal mood and affect. Her behavior is normal.  Nursing note and vitals reviewed.  Results for orders placed or performed in visit on 10/06/16  POCT Microscopic Urinalysis ([facility])  Result Value Ref Range   WBC,UR,HPF,POC Few (A) None WBC/hpf   RBC,UR,HPF,POC Few (A) None RBC/hpf   Bacteria Few (A) None, Too numerous to count   Mucus Present (A) Absent   Epithelial Cells, UR Per Microscopy Few (A) None, Too numerous to count cells/hpf  POCT urinalysis dipstick  Result Value Ref Range   Color, UA yellow yellow   Clarity, UA clear clear   Glucose, UA negative negative   Bilirubin, UA negative negative   Ketones, POC UA negative negative   Spec Grav, UA 1.015    Blood, UA trace-intact (A) negative   pH, UA 6.0    Protein Ur, POC negative negative   Urobilinogen, UA 0.2    Nitrite, UA Negative Negative   Leukocytes, UA Negative Negative  POCT Wet + KOH Prep  Result Value Ref Range   Yeast by KOH Absent Present, Absent   Yeast by wet prep Absent Present, Absent   WBC by wet prep Few None, Few, Too numerous to count   Clue Cells Wet Prep HPF POC None None, Too numerous to count   Trich by wet prep Absent Present, Absent   Bacteria Wet Prep HPF POC Few None, Few, Too numerous to count   Epithelial Cells By [employer] Pref ([facility]) Few None, Few, Too numerous to count   RBC,UR,HPF,POC None None RBC/hpf       Assessment & Plan:   1. Dysuria   2. Vaginal discharge    -New. -Send urine culture; treat with Bactrim empirically for UTI due to symptoms. -rx for Terazol provided to use for Bactrim  induced candidiasis; also recommend applying Terconazole to clitoris daily.  Recommend applying hydrocortisone 1% every morning.    Orders Placed This Encounter  Procedures  . Urine culture  . POCT Microscopic Urinalysis ([facility])  . POCT urinalysis dipstick  . POCT Wet + KOH Prep   Meds ordered this encounter  Medications  . terconazole (TERAZOL 7) 0.4 % vaginal cream    Sig: Place 1 applicator vaginally at bedtime.    Dispense:  45 g    Refill:  0  .  sulfamethoxazole-trimethoprim (BACTRIM DS,SEPTRA DS) 800-160 MG tablet    Sig: Take 1 tablet by mouth 2 (two) times daily.    Dispense:  14 tablet    Refill:  0    No Follow-up on file.   Macai Sisneros Elayne Guerin, M.D. [facility] [address] [place], [place]  [address] [phone] phone [phone] fax

## 2016-10-06 NOTE — Patient Instructions (Addendum)
   IF you received an x-ray today, you will receive an invoice from [facility]. Please contact  Radiology at [phone] with questions or concerns regarding your invoice.   IF you received labwork today, you will receive an invoice from [facility]. Please contact [facility] at [phone] with questions or concerns regarding your invoice.   Our billing staff will not be able to assist you with questions regarding bills from these companies.  You will be contacted with the lab results as soon as they are available. The fastest way to get your results is to activate your My Chart account. Instructions are located on the last page of this paperwork. If you have not heard from us regarding the results in 2 weeks, please contact this office.     Urinary Tract Infection Urinary tract infections (UTIs) can develop anywhere along your urinary tract. Your urinary tract is your body's drainage system for removing wastes and extra water. Your urinary tract includes two kidneys, two ureters, a bladder, and a urethra. Your kidneys are a pair of bean-shaped organs. Each kidney is about the size of your fist. They are located below your ribs, one on each side of your spine. CAUSES Infections are caused by microbes, which are microscopic organisms, including fungi, viruses, and bacteria. These organisms are so small that they can only be seen through a microscope. Bacteria are the microbes that most commonly cause UTIs. SYMPTOMS  Symptoms of UTIs may vary by age and gender of the patient and by the location of the infection. Symptoms in young women typically include a frequent and intense urge to urinate and a painful, burning feeling in the bladder or urethra during urination. Older women and men are more likely to be tired, shaky, and weak and have muscle aches and abdominal pain. A fever may mean the infection is in your kidneys. Other symptoms of a kidney  infection include pain in your back or sides below the ribs, nausea, and vomiting. DIAGNOSIS To diagnose a UTI, your caregiver will ask you about your symptoms. Your caregiver will also ask you to provide a urine sample. The urine sample will be tested for bacteria and white blood cells. White blood cells are made by your body to help fight infection. TREATMENT  Typically, UTIs can be treated with medication. Because most UTIs are caused by a bacterial infection, they usually can be treated with the use of antibiotics. The choice of antibiotic and length of treatment depend on your symptoms and the type of bacteria causing your infection. HOME CARE INSTRUCTIONS  If you were prescribed antibiotics, take them exactly as your caregiver instructs you. Finish the medication even if you feel better after you have only taken some of the medication.  Drink enough water and fluids to keep your urine clear or pale yellow.  Avoid caffeine, tea, and carbonated beverages. They tend to irritate your bladder.  Empty your bladder often. Avoid holding urine for long periods of time.  Empty your bladder before and after sexual intercourse.  After a bowel movement, women should cleanse from front to back. Use each tissue only once. SEEK MEDICAL CARE IF:   You have back pain.  You develop a fever.  Your symptoms do not begin to resolve within 3 days. SEEK IMMEDIATE MEDICAL CARE IF:   You have severe back pain or lower abdominal pain.  You develop chills.  You have nausea or vomiting.  You have continued burning or discomfort with urination.   MAKE SURE YOU:   Understand these instructions.  Will watch your condition.  Will get help right away if you are not doing well or get worse.   This information is not intended to replace advice given to you by your health care provider. Make sure you discuss any questions you have with your health care provider.   Document Released: 09/19/2005 Document  Revised: 08/31/2015 Document Reviewed: 01/18/2012 Elsevier Interactive Patient Education 2016 Elsevier Inc.  

## 2016-10-09 LAB — URINE CULTURE

## 2016-10-26 ENCOUNTER — Other Ambulatory Visit: Payer: Self-pay | Admitting: Family Medicine

## 2016-10-30 DIAGNOSIS — H401121 Primary open-angle glaucoma, left eye, mild stage: Secondary | ICD-10-CM | POA: Diagnosis not present

## 2016-10-31 DIAGNOSIS — F4323 Adjustment disorder with mixed anxiety and depressed mood: Secondary | ICD-10-CM | POA: Diagnosis not present

## 2016-10-31 NOTE — Telephone Encounter (Signed)
08/2016 last visit and labs

## 2016-11-01 ENCOUNTER — Encounter: Payer: Self-pay | Admitting: Family Medicine

## 2016-11-02 NOTE — Telephone Encounter (Signed)
Dr Tamala Julian, in researching it looks like last OV for depression on 9/12 both of these medications were on pt's current med list, and in Plan at end of visit you just wrote continue current meds. It looks like on 11/3 the pharm sent a request to [name] to refill the lexapro 10 mg and Santiago Glad sent it to you to review. It was approved, but is pt supposed to just be on the citalopram 20 now and no Lexapro? Please clarify for pt.

## 2016-11-05 MED ORDER — CITALOPRAM HYDROBROMIDE 20 MG PO TABS: 20.0000 mg | ORAL_TABLET | Freq: Every day | ORAL | 1 refills | Status: DC

## 2016-11-05 NOTE — Telephone Encounter (Signed)
Sent mychart message to patient.  Deleted Lexapro from medication list; sent in refill of Citalopram.

## 2016-11-23 ENCOUNTER — Other Ambulatory Visit: Payer: Self-pay | Admitting: Family Medicine

## 2016-11-25 NOTE — Telephone Encounter (Signed)
08/2016 last ov and labs 

## 2016-11-26 DIAGNOSIS — F4323 Adjustment disorder with mixed anxiety and depressed mood: Secondary | ICD-10-CM | POA: Diagnosis not present

## 2016-12-05 DIAGNOSIS — H401121 Primary open-angle glaucoma, left eye, mild stage: Secondary | ICD-10-CM | POA: Diagnosis not present

## 2016-12-26 DIAGNOSIS — F4323 Adjustment disorder with mixed anxiety and depressed mood: Secondary | ICD-10-CM | POA: Diagnosis not present

## 2017-01-30 ENCOUNTER — Ambulatory Visit (INDEPENDENT_AMBULATORY_CARE_PROVIDER_SITE_OTHER): Payer: BLUE CROSS/BLUE SHIELD | Admitting: Emergency Medicine

## 2017-01-30 VITALS — BP 102/76 | HR 96 | Temp 99.5°F | Resp 17 | Ht 65.5 in | Wt 209.0 lb

## 2017-01-30 DIAGNOSIS — R05 Cough: Secondary | ICD-10-CM

## 2017-01-30 DIAGNOSIS — J069 Acute upper respiratory infection, unspecified: Secondary | ICD-10-CM | POA: Diagnosis not present

## 2017-01-30 DIAGNOSIS — J029 Acute pharyngitis, unspecified: Secondary | ICD-10-CM

## 2017-01-30 DIAGNOSIS — R059 Cough, unspecified: Secondary | ICD-10-CM

## 2017-01-30 MED ORDER — HYDROCODONE-ACETAMINOPHEN 5-325 MG PO TABS: 1.0000 | ORAL_TABLET | Freq: Four times a day (QID) | ORAL | 0 refills | Status: DC | PRN

## 2017-01-30 MED ORDER — AMOXICILLIN-POT CLAVULANATE 875-125 MG PO TABS: 1.0000 | ORAL_TABLET | Freq: Two times a day (BID) | ORAL | 0 refills | Status: DC

## 2017-01-30 NOTE — Patient Instructions (Addendum)
     IF you received an x-ray today, you will receive an invoice from [facility]. Please contact [facility] at [phone] with questions or concerns regarding your invoice.   IF you received labwork today, you will receive an invoice from [facility]. Please contact [facility] at [phone] with questions or concerns regarding your invoice.   Our billing staff will not be able to assist you with questions regarding bills from these companies.  You will be contacted with the lab results as soon as they are available. The fastest way to get your results is to activate your My Chart account. Instructions are located on the last page of this paperwork. If you have not heard from us regarding the results in 2 weeks, please contact this office.      Upper Respiratory Infection, Adult Most upper respiratory infections (URIs) are caused by a virus. A URI affects the nose, throat, and upper air passages. The most common type of URI is often called "the common cold." Follow these instructions at home:  Take medicines only as told by your doctor.  Gargle warm saltwater or take cough drops to comfort your throat as told by your doctor.  Use a warm mist humidifier or inhale steam from a shower to increase air moisture. This may make it easier to breathe.  Drink enough fluid to keep your pee (urine) clear or pale yellow.  Eat soups and other clear broths.  Have a healthy diet.  Rest as needed.  Go back to work when your fever is gone or your doctor says it is okay.  You may need to stay home longer to avoid giving your URI to others.  You can also wear a face mask and wash your hands often to prevent spread of the virus.  Use your inhaler more if you have asthma.  Do not use any tobacco products, including cigarettes, chewing tobacco, or electronic cigarettes. If you need help quitting, ask your doctor. Contact a doctor if:  You are getting worse, not better.  Your  symptoms are not helped by medicine.  You have chills.  You are getting more short of breath.  You have brown or red mucus.  You have yellow or brown discharge from your nose.  You have pain in your face, especially when you bend forward.  You have a fever.  You have puffy (swollen) neck glands.  You have pain while swallowing.  You have white areas in the back of your throat. Get help right away if:  You have very bad or constant:  Headache.  Ear pain.  Pain in your forehead, behind your eyes, and over your cheekbones (sinus pain).  Chest pain.  You have long-lasting (chronic) lung disease and any of the following:  Wheezing.  Long-lasting cough.  Coughing up blood.  A change in your usual mucus.  You have a stiff neck.  You have changes in your:  Vision.  Hearing.  Thinking.  Mood. This information is not intended to replace advice given to you by your health care provider. Make sure you discuss any questions you have with your health care provider. Document Released: 05/28/2008 Document Revised: 08/12/2016 Document Reviewed: 03/17/2014 Elsevier Interactive Patient Education  2017 Elsevier Inc.  

## 2017-01-30 NOTE — Progress Notes (Signed)
Cheryl Morton 61 y.o.   Chief Complaint  Patient presents with  . Cough    onset 2 days  . Headache  . Sore Throat    HISTORY OF PRESENT ILLNESS: This is a 61 y.o. female complaining of 2-3 day h/o cough, headache, and sore throat.  Sore Throat   This is a new problem. The current episode started in the past 7 days. The problem has been gradually worsening. The maximum temperature recorded prior to her arrival was 101 - 101.9 F. The pain is at a severity of 5/10. Associated symptoms include congestion, coughing, headaches and trouble swallowing. Pertinent negatives include no abdominal pain, diarrhea, drooling, ear discharge, ear pain, neck pain, shortness of breath, swollen glands or vomiting. She has tried cool liquids for the symptoms.     Prior to Admission medications   Medication Sig Start Date End Date Taking? Authorizing Provider  ACIPHEX 20 MG tablet TAKE 2 TABLETS BY MOUTH DAILY 11/25/16  Yes Wardell Honour, MD  ALPRAZolam Duanne Moron) 0.5 MG tablet Take 1 tablet (0.5 mg total) by mouth 2 (two) times daily as needed. for anxiety 09/04/16  Yes Wardell Honour, MD  aspirin 81 MG tablet Take 81 mg by mouth daily.   Yes Historical Provider, MD  Blood Glucose Monitoring Suppl (CONTOUR BLOOD GLUCOSE SYSTEM) DEVI Use to check blood sugar 3 times a week. Dx code: E11.9. 03/12/16  Yes Wardell Honour, MD  buPROPion (WELLBUTRIN XL) 150 MG 24 hr tablet TAKE 1 TABLET BY MOUTH EVERY DAY 11/01/16  Yes Wardell Honour, MD  citalopram (CELEXA) 20 MG tablet Take 1 tablet (20 mg total) by mouth daily. 11/05/16  Yes Wardell Honour, MD  desonide (DESOWEN) 0.05 % cream Apply topically 2 (two) times daily. 09/04/16  Yes Wardell Honour, MD  fluticasone (FLONASE) 50 MCG/ACT nasal spray Place 2 sprays into both nostrils daily. 03/06/16  Yes Wardell Honour, MD  glucose blood (ONE TOUCH ULTRA TEST) test strip Check blood sugar three times a week. 03/06/16  Yes Wardell Honour, MD  hydrocortisone (ANUCORT-HC) 25 MG  suppository Place 1 suppository (25 mg total) rectally 2 (two) times daily. 08/18/14  Yes Barton Fanny, MD  ketoconazole (NIZORAL) 2 % shampoo Apply 1 application topically 2 (two) times a week. 09/06/16  Yes Wardell Honour, MD  Lancets [facility] ULTRASOFT) lancets Check sugar once daily 03/06/16  Yes Wardell Honour, MD  lisinopril-hydrochlorothiazide (PRINZIDE,ZESTORETIC) 20-12.5 MG tablet Take 2 tablets by mouth daily. 03/06/16  Yes Wardell Honour, MD  metFORMIN (GLUCOPHAGE) 500 MG tablet Take 1 tablet (500 mg total) by mouth 2 (two) times daily with a meal. 03/06/16  Yes Wardell Honour, MD  rosuvastatin (CRESTOR) 10 MG tablet Take 1 tablet (10 mg total) by mouth daily. 03/06/16  Yes Wardell Honour, MD  sulfamethoxazole-trimethoprim (BACTRIM DS,SEPTRA DS) 800-160 MG tablet Take 1 tablet by mouth 2 (two) times daily. 10/06/16  Yes Wardell Honour, MD  terconazole (TERAZOL 7) 0.4 % vaginal cream Place 1 applicator vaginally at bedtime. 10/06/16  Yes Wardell Honour, MD    Allergies  Allergen Reactions  . Biaxin [Clarithromycin] Rash    Patient Active Problem List   Diagnosis Date Noted  . Anxiety and depression 07/22/2016  . Obesity, unspecified 08/18/2013  . Diabetes mellitus ([place]) 09/24/2012  . GERD 06/27/2010  . HYPERLIPIDEMIA-MIXED 07/25/2009  . TOBACCO ABUSE 07/25/2009  . ESSENTIAL HYPERTENSION, BENIGN 07/25/2009    Past Medical History:  Diagnosis Date  . Allergy   . Anxiety   . Depression   . Diabetes mellitus without complication ([place])   . Glaucoma   . Hypertension     Past Surgical History:  Procedure Laterality Date  . ABDOMINAL HYSTERECTOMY     Fibroids/DUB.  Ovaries intact.    Social History   Social History  . Marital status: Divorced    Spouse name: N/A  . Number of children: N/A  . Years of education: N/A   Occupational History  . Camera operator    Social History Main Topics  . Smoking status: Former Smoker    Packs/day: 0.25    Types:  Cigarettes    Quit date: 12/24/2010  . Smokeless tobacco: Never Used  . Alcohol use 1.8 oz/week    3 Glasses of wine per week     Comment: wine  . Drug use: No  . Sexual activity: No   Other Topics Concern  . Not on file   Social History Narrative   Marital status: divorced; not dating; not interested      Children:  None      LIves: alone      Employment: Works for [employer] x 25 years.      Tobacco: none; quit age 2.      Alcohol:  Socially; 3-4 times per week; wine one glass each episode      Exercise:  Sporadic.  Walking some.      Seatbelt: 100%; never texting while driving    Family History  Problem Relation Age of Onset  . Hypertension Mother   . Cancer Mother     lung cancer  . Multiple sclerosis Sister   . Diabetes Maternal Grandmother   . Hypertension Maternal Grandmother   . Arthritis Sister   . Mental retardation Sister      Review of Systems  Constitutional: Positive for fever and malaise/fatigue. Negative for chills.  HENT: Positive for congestion, sore throat and trouble swallowing. Negative for drooling, ear discharge, ear pain and sinus pain.   Eyes: Negative for discharge and redness.  Respiratory: Positive for cough. Negative for shortness of breath and wheezing.   Cardiovascular: Negative for chest pain and palpitations.  Gastrointestinal: Negative for abdominal pain, diarrhea, nausea and vomiting.  Genitourinary: Negative for dysuria and hematuria.  Musculoskeletal: Positive for myalgias. Negative for neck pain.  Neurological: Positive for headaches. Negative for dizziness.  All other systems reviewed and are negative.  Vitals:   01/30/17 1718  BP: 102/76  Pulse: 96  Resp: 17  Temp: 99.5 F (37.5 C)    Physical Exam  Constitutional: She is oriented to person, place, and time. She appears well-developed and well-nourished.  HENT:  Head: Normocephalic and atraumatic.  Right Ear: Hearing, tympanic membrane, external ear  and ear canal normal.  Left Ear: Hearing, tympanic membrane, external ear and ear canal normal.  Mouth/Throat: Posterior oropharyngeal erythema present. No oropharyngeal exudate or tonsillar abscesses.  Eyes: Conjunctivae and EOM are normal. Pupils are equal, round, and reactive to light.  Neck: Normal range of motion. Neck supple. No JVD present. No thyromegaly present.  Cardiovascular: Normal rate, regular rhythm and normal heart sounds.   Pulmonary/Chest: Effort normal and breath sounds normal.  Abdominal: Soft. Bowel sounds are normal. She exhibits no distension. There is no tenderness.  Musculoskeletal: Normal range of motion.  Lymphadenopathy:    She has no cervical adenopathy.  Neurological: She is alert and oriented to person, place, and  time. No sensory deficit. She exhibits normal muscle tone.  Skin: Skin is warm and dry. Capillary refill takes less than 2 seconds.  Psychiatric: She has a normal mood and affect. Her behavior is normal.  Vitals reviewed.    ASSESSMENT & PLAN: Khanh was seen today for cough, headache and sore throat.  Diagnoses and all orders for this visit:  Acute pharyngitis, unspecified etiology  Acute upper respiratory infection  Cough  Other orders -     amoxicillin-clavulanate (AUGMENTIN) 875-125 MG tablet; Take 1 tablet by mouth 2 (two) times daily. -     HYDROcodone-acetaminophen (NORCO) 5-325 MG tablet; Take 1 tablet by mouth every 6 (six) hours as needed for moderate pain.    Patient Instructions       IF you received an x-ray today, you will receive an invoice from [facility]. Please contact [facility] at [phone] with questions or concerns regarding your invoice.   IF you received labwork today, you will receive an invoice from [facility]. Please contact [facility] at [phone] with questions or concerns regarding your invoice.   Our billing staff will not be able to assist you with questions regarding bills from  these companies.  You will be contacted with the lab results as soon as they are available. The fastest way to get your results is to activate your My Chart account. Instructions are located on the last page of this paperwork. If you have not heard from [name] regarding the results in 2 weeks, please contact this office.     Upper Respiratory Infection, Adult Most upper respiratory infections (URIs) are caused by a virus. A URI affects the nose, throat, and upper air passages. The most common type of URI is often called "the common cold." Follow these instructions at home:  Take medicines only as told by your doctor.  Gargle warm saltwater or take cough drops to comfort your throat as told by your doctor.  Use a warm mist humidifier or inhale steam from a shower to increase air moisture. This may make it easier to breathe.  Drink enough fluid to keep your pee (urine) clear or pale yellow.  Eat soups and other clear broths.  Have a healthy diet.  Rest as needed.  Go back to work when your fever is gone or your doctor says it is okay.  You may need to stay home longer to avoid giving your URI to others.  You can also wear a face mask and wash your hands often to prevent spread of the virus.  Use your inhaler more if you have asthma.  Do not use any tobacco products, including cigarettes, chewing tobacco, or electronic cigarettes. If you need help quitting, ask your doctor. Contact a doctor if:  You are getting worse, not better.  Your symptoms are not helped by medicine.  You have chills.  You are getting more short of breath.  You have brown or red mucus.  You have yellow or brown discharge from your nose.  You have pain in your face, especially when you bend forward.  You have a fever.  You have puffy (swollen) neck glands.  You have pain while swallowing.  You have white areas in the back of your throat. Get help right away if:  You have very bad or  constant:  Headache.  Ear pain.  Pain in your forehead, behind your eyes, and over your cheekbones (sinus pain).  Chest pain.  You have long-lasting (chronic) lung disease and any of  the following:  Wheezing.  Long-lasting cough.  Coughing up blood.  A change in your usual mucus.  You have a stiff neck.  You have changes in your:  Vision.  Hearing.  Thinking.  Mood. This information is not intended to replace advice given to you by your health care provider. Make sure you discuss any questions you have with your health care provider. Document Released: 05/28/2008 Document Revised: 08/12/2016 Document Reviewed: 03/17/2014 Elsevier Interactive Patient Education  2017 Elsevier Inc.      Agustina Caroli, MD [facility]

## 2017-02-14 ENCOUNTER — Ambulatory Visit (INDEPENDENT_AMBULATORY_CARE_PROVIDER_SITE_OTHER): Payer: BLUE CROSS/BLUE SHIELD | Admitting: Psychology

## 2017-02-14 DIAGNOSIS — F4323 Adjustment disorder with mixed anxiety and depressed mood: Secondary | ICD-10-CM

## 2017-02-17 ENCOUNTER — Other Ambulatory Visit: Payer: Self-pay | Admitting: Family Medicine

## 2017-02-21 ENCOUNTER — Other Ambulatory Visit: Payer: Self-pay | Admitting: Family Medicine

## 2017-02-23 NOTE — Telephone Encounter (Signed)
OK to refill

## 2017-03-01 ENCOUNTER — Other Ambulatory Visit: Payer: Self-pay | Admitting: Family Medicine

## 2017-03-11 ENCOUNTER — Ambulatory Visit (INDEPENDENT_AMBULATORY_CARE_PROVIDER_SITE_OTHER): Payer: BLUE CROSS/BLUE SHIELD | Admitting: Psychology

## 2017-03-11 DIAGNOSIS — F4323 Adjustment disorder with mixed anxiety and depressed mood: Secondary | ICD-10-CM

## 2017-03-12 ENCOUNTER — Ambulatory Visit (INDEPENDENT_AMBULATORY_CARE_PROVIDER_SITE_OTHER): Payer: BLUE CROSS/BLUE SHIELD | Admitting: Family Medicine

## 2017-03-12 ENCOUNTER — Encounter: Payer: Self-pay | Admitting: Family Medicine

## 2017-03-12 VITALS — BP 120/83 | HR 87 | Temp 98.7°F | Resp 18 | Ht 65.5 in | Wt 213.0 lb

## 2017-03-12 DIAGNOSIS — Z0000 Encounter for general adult medical examination without abnormal findings: Secondary | ICD-10-CM

## 2017-03-12 DIAGNOSIS — Z6834 Body mass index (BMI) 34.0-34.9, adult: Secondary | ICD-10-CM

## 2017-03-12 DIAGNOSIS — K219 Gastro-esophageal reflux disease without esophagitis: Secondary | ICD-10-CM | POA: Diagnosis not present

## 2017-03-12 DIAGNOSIS — F418 Other specified anxiety disorders: Secondary | ICD-10-CM

## 2017-03-12 DIAGNOSIS — I10 Essential (primary) hypertension: Secondary | ICD-10-CM | POA: Diagnosis not present

## 2017-03-12 DIAGNOSIS — F329 Major depressive disorder, single episode, unspecified: Secondary | ICD-10-CM

## 2017-03-12 DIAGNOSIS — E119 Type 2 diabetes mellitus without complications: Secondary | ICD-10-CM | POA: Diagnosis not present

## 2017-03-12 DIAGNOSIS — E6609 Other obesity due to excess calories: Secondary | ICD-10-CM | POA: Diagnosis not present

## 2017-03-12 DIAGNOSIS — E7800 Pure hypercholesterolemia, unspecified: Secondary | ICD-10-CM

## 2017-03-12 DIAGNOSIS — F419 Anxiety disorder, unspecified: Secondary | ICD-10-CM

## 2017-03-12 DIAGNOSIS — F32A Depression, unspecified: Secondary | ICD-10-CM

## 2017-03-12 DIAGNOSIS — E66811 Obesity, class 1: Secondary | ICD-10-CM

## 2017-03-12 MED ORDER — FLUTICASONE PROPIONATE 50 MCG/ACT NA SUSP: 2.0000 | Freq: Every day | NASAL | 3 refills | Status: DC

## 2017-03-12 MED ORDER — ACIPHEX 20 MG PO TBEC: 40.0000 mg | DELAYED_RELEASE_TABLET | Freq: Every day | ORAL | 3 refills | Status: DC

## 2017-03-12 MED ORDER — CITALOPRAM HYDROBROMIDE 20 MG PO TABS: 20.0000 mg | ORAL_TABLET | Freq: Every day | ORAL | 3 refills | Status: DC

## 2017-03-12 MED ORDER — ONETOUCH ULTRASOFT LANCETS MISC: 3 refills | Status: DC

## 2017-03-12 MED ORDER — LISINOPRIL-HYDROCHLOROTHIAZIDE 20-12.5 MG PO TABS: 2.0000 | ORAL_TABLET | Freq: Every day | ORAL | 3 refills | Status: DC

## 2017-03-12 MED ORDER — GLUCOSE BLOOD VI STRP: ORAL_STRIP | 3 refills | Status: DC

## 2017-03-12 MED ORDER — ROSUVASTATIN CALCIUM 10 MG PO TABS: ORAL_TABLET | ORAL | 3 refills | Status: DC

## 2017-03-12 MED ORDER — ALPRAZOLAM 0.5 MG PO TABS: 0.5000 mg | ORAL_TABLET | Freq: Two times a day (BID) | ORAL | 5 refills | Status: DC | PRN

## 2017-03-12 MED ORDER — HYDROCORTISONE ACETATE 25 MG RE SUPP: 25.0000 mg | Freq: Two times a day (BID) | RECTAL | 3 refills | Status: AC

## 2017-03-12 MED ORDER — METFORMIN HCL 500 MG PO TABS: ORAL_TABLET | ORAL | 3 refills | Status: DC

## 2017-03-12 MED ORDER — BUPROPION HCL ER (XL) 150 MG PO TB24: 150.0000 mg | ORAL_TABLET | Freq: Every day | ORAL | 3 refills | Status: DC

## 2017-03-12 NOTE — Patient Instructions (Addendum)
IF you received an x-ray today, you will receive an invoice from [facility]. Please contact [facility] at [phone] with questions or concerns regarding your invoice.   IF you received labwork today, you will receive an invoice from [facility]. Please contact [facility] at [phone] with questions or concerns regarding your invoice.   Our billing staff will not be able to assist you with questions regarding bills from these companies.  You will be contacted with the lab results as soon as they are available. The fastest way to get your results is to activate your My Chart account. Instructions are located on the last page of this paperwork. If you have not heard from [name] regarding the results in 2 weeks, please contact this office.      Preventive Care 40-64 Years, Female Preventive care refers to lifestyle choices and visits with your health care provider that can promote health and wellness. What does preventive care include?  A yearly physical exam. This is also called an annual well check.  Dental exams once or twice a year.  Routine eye exams. Ask your health care provider how often you should have your eyes checked.  Personal lifestyle choices, including:  Daily care of your teeth and gums.  Regular physical activity.  Eating a healthy diet.  Avoiding tobacco and drug use.  Limiting alcohol use.  Practicing safe sex.  Taking low-dose aspirin daily starting at age 55.  Taking vitamin and mineral supplements as recommended by your health care provider. What happens during an annual well check? The services and screenings done by your health care provider during your annual well check will depend on your age, overall health, lifestyle risk factors, and family history of disease. Counseling  Your health care provider may ask you questions about your:  Alcohol use.  Tobacco use.  Drug use.  Emotional well-being.  Home and relationship  well-being.  Sexual activity.  Eating habits.  Work and work Statistician.  Method of birth control.  Menstrual cycle.  Pregnancy history. Screening  You may have the following tests or measurements:  Height, weight, and BMI.  Blood pressure.  Lipid and cholesterol levels. These may be checked every 5 years, or more frequently if you are over 13 years old.  Skin check.  Lung cancer screening. You may have this screening every year starting at age 20 if you have a 30-pack-year history of smoking and currently smoke or have quit within the past 15 years.  Fecal occult blood test (FOBT) of the stool. You may have this test every year starting at age 41.  Flexible sigmoidoscopy or colonoscopy. You may have a sigmoidoscopy every 5 years or a colonoscopy every 10 years starting at age 79.  Hepatitis C blood test.  Hepatitis B blood test.  Sexually transmitted disease (STD) testing.  Diabetes screening. This is done by checking your blood sugar (glucose) after you have not eaten for a while (fasting). You may have this done every 1-3 years.  Mammogram. This may be done every 1-2 years. Talk to your health care provider about when you should start having regular mammograms. This may depend on whether you have a family history of breast cancer.  BRCA-related cancer screening. This may be done if you have a family history of breast, ovarian, tubal, or peritoneal cancers.  Pelvic exam and Pap test. This may be done every 3 years starting at age 24. Starting at age 33, this may be done every 5 years  if you have a Pap test in combination with an HPV test.  Bone density scan. This is done to screen for osteoporosis. You may have this scan if you are at high risk for osteoporosis. Discuss your test results, treatment options, and if necessary, the need for more tests with your health care provider. Vaccines  Your health care provider may recommend certain vaccines, such  as:  Influenza vaccine. This is recommended every year.  Tetanus, diphtheria, and acellular pertussis (Tdap, Td) vaccine. You may need a Td booster every 10 years.  Varicella vaccine. You may need this if you have not been vaccinated.  Zoster vaccine. You may need this after age 75.  Measles, mumps, and rubella (MMR) vaccine. You may need at least one dose of MMR if you were born in 1957 or later. You may also need a second dose.  Pneumococcal 13-valent conjugate (PCV13) vaccine. You may need this if you have certain conditions and were not previously vaccinated.  Pneumococcal polysaccharide (PPSV23) vaccine. You may need one or two doses if you smoke cigarettes or if you have certain conditions.  Meningococcal vaccine. You may need this if you have certain conditions.  Hepatitis A vaccine. You may need this if you have certain conditions or if you travel or work in places where you may be exposed to hepatitis A.  Hepatitis B vaccine. You may need this if you have certain conditions or if you travel or work in places where you may be exposed to hepatitis B.  Haemophilus influenzae type b (Hib) vaccine. You may need this if you have certain conditions. Talk to your health care provider about which screenings and vaccines you need and how often you need them. This information is not intended to replace advice given to you by your health care provider. Make sure you discuss any questions you have with your health care provider. Document Released: 01/06/2016 Document Revised: 08/29/2016 Document Reviewed: 10/11/2015 Elsevier Interactive Patient Education  2017 [employer].

## 2017-03-12 NOTE — Progress Notes (Signed)
Subjective:    Patient ID: Cheryl Morton, female    DOB: 21-Jul-1956, 61 y.o.   MRN: [id number]  03/12/2017  Annual Exam   HPI This 61 y.o. female presents for Complete Physical Examination.  Last physical: 03-06-2016 Pap smear: 2012; hysterectomy; fibroids; ovaries intact.  No cervical dysplasia. Mammogram:09/14/16 Colonoscopy:  07/04/2010 hemorrhoids; repeat 10 years; [place]. Bone density: never Eye exam:   06/2016  Immunization History  Administered Date(s) Administered  . Influenza Split 10/18/2013, 10/24/2014, 10/25/2015  . Influenza,inj,Quad PF,36+ Mos 09/04/2016  . Pneumococcal Conjugate-13 02/23/2015  . Pneumococcal Polysaccharide-23 03/06/2016  . Tdap 02/17/2014   BP Readings from Last 3 Encounters:  03/12/17 120/83  01/30/17 102/76  10/06/16 118/82   Wt Readings from Last 3 Encounters:  03/12/17 213 lb (96.6 kg)  01/30/17 209 lb (94.8 kg)  10/06/16 208 lb (94.3 kg)     Review of Systems  Constitutional: Negative for activity change, appetite change, chills, diaphoresis, fatigue, fever and unexpected weight change.  HENT: Negative for congestion, dental problem, drooling, ear discharge, ear pain, facial swelling, hearing loss, mouth sores, nosebleeds, postnasal drip, rhinorrhea, sinus pressure, sneezing, sore throat, tinnitus, trouble swallowing and voice change.   Eyes: Negative for photophobia, pain, discharge, redness, itching and visual disturbance.  Respiratory: Negative for apnea, cough, choking, chest tightness, shortness of breath, wheezing and stridor.   Cardiovascular: Negative for chest pain, palpitations and leg swelling.  Gastrointestinal: Negative for abdominal distention, abdominal pain, anal bleeding, blood in stool, constipation, diarrhea, nausea, rectal pain and vomiting.  Endocrine: Negative for cold intolerance, heat intolerance, polydipsia, polyphagia and polyuria.  Genitourinary: Negative for decreased urine volume, difficulty urinating,  dyspareunia, dysuria, enuresis, flank pain, frequency, genital sores, hematuria, menstrual problem, pelvic pain, urgency, vaginal bleeding, vaginal discharge and vaginal pain.  Musculoskeletal: Negative for arthralgias, back pain, gait problem, joint swelling, myalgias, neck pain and neck stiffness.  Skin: Negative for color change, pallor, rash and wound.  Allergic/Immunologic: Negative for environmental allergies, food allergies and immunocompromised state.  Neurological: Negative for dizziness, tremors, seizures, syncope, facial asymmetry, speech difficulty, weakness, light-headedness, numbness and headaches.  Hematological: Negative for adenopathy. Does not bruise/bleed easily.  Psychiatric/Behavioral: Negative for agitation, behavioral problems, confusion, decreased concentration, dysphoric mood, hallucinations, self-injury, sleep disturbance and suicidal ideas. The patient is not nervous/anxious and is not hyperactive.     Past Medical History:  Diagnosis Date  . Allergy   . Anxiety   . Depression   . Diabetes mellitus without complication ([place])   . Glaucoma   . Hypertension    Past Surgical History:  Procedure Laterality Date  . ABDOMINAL HYSTERECTOMY     Fibroids/DUB.  Ovaries intact.   Allergies  Allergen Reactions  . Biaxin [Clarithromycin] Rash   Current Outpatient Prescriptions  Medication Sig Dispense Refill  . ACIPHEX 20 MG tablet Take 2 tablets (40 mg total) by mouth daily. 180 tablet 3  . ALPRAZolam (XANAX) 0.5 MG tablet Take 1 tablet (0.5 mg total) by mouth 2 (two) times daily as needed. for anxiety 60 tablet 5  . aspirin 81 MG tablet Take 81 mg by mouth daily.    . Blood Glucose Monitoring Suppl (CONTOUR BLOOD GLUCOSE SYSTEM) DEVI Use to check blood sugar 3 times a week. Dx code: E11.9. 1 Device 0  . buPROPion (WELLBUTRIN XL) 150 MG 24 hr tablet Take 1 tablet (150 mg total) by mouth daily. 90 tablet 3  . citalopram (CELEXA) 20 MG tablet Take 1 tablet (20 mg total)  by mouth  daily. 90 tablet 3  . fluticasone (FLONASE) 50 MCG/ACT nasal spray Place 2 sprays into both nostrils daily. 48 g 3  . glucose blood (ONE TOUCH ULTRA TEST) test strip Check blood sugar once daily 100 each 3  . hydrocortisone (ANUCORT-HC) 25 MG suppository Place 1 suppository (25 mg total) rectally 2 (two) times daily. 24 suppository 3  . Lancets (ONETOUCH ULTRASOFT) lancets Check sugar once daily 100 each 3  . lisinopril-hydrochlorothiazide (PRINZIDE,ZESTORETIC) 20-12.5 MG tablet Take 2 tablets by mouth daily. 180 tablet 3  . metFORMIN (GLUCOPHAGE) 500 MG tablet TAKE 1 TABLET(500 MG) BY MOUTH TWICE DAILY WITH A MEAL 180 tablet 3  . rosuvastatin (CRESTOR) 10 MG tablet TAKE 1 TABLET(10 MG) BY MOUTH DAILY 90 tablet 3   No current facility-administered medications for this visit.    Social History   Social History  . Marital status: Divorced    Spouse name: N/A  . Number of children: 0  . Years of education: N/A   Occupational History  . Camera operator   . DIRECTOR OF SALES [employer]   Social History Main Topics  . Smoking status: Former Smoker    Packs/day: 0.25    Types: Cigarettes    Quit date: 12/24/2010  . Smokeless tobacco: Never Used  . Alcohol use 1.8 oz/week    3 Glasses of wine per week     Comment: wine  . Drug use: No  . Sexual activity: No   Other Topics Concern  . 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      Tobacco: none; quit age 17.      Alcohol:  Socially; 3-4 times per week; wine one glass each episode      Exercise:  Sporadic.  Walking some. No yoga in 2018.      Seatbelt: 100%; never texting while driving   Family History  Problem Relation Age of Onset  . Hypertension Mother   . Cancer Mother     lung cancer  . Multiple sclerosis  Sister   . Diabetes Maternal Grandmother   . Hypertension Maternal Grandmother   . Arthritis Sister   . Mental retardation Sister        Objective:    BP 120/83   Pulse 87   Temp 98.7 F (37.1 C) (Oral)   Resp 18   Ht 5' 5.5" (1.664 m)   Wt 213 lb (96.6 kg)   SpO2 96%   BMI 34.91 kg/m  Physical Exam  Constitutional: She is oriented to person, place, and time. She appears well-developed and well-nourished. No distress.  HENT:  Head: Normocephalic and atraumatic.  Right Ear: External ear normal.  Left Ear: External ear normal.  Nose: Nose normal.  Mouth/Throat: Oropharynx is clear and moist.  Eyes: Conjunctivae and EOM are normal. Pupils are equal, round, and reactive to light.  Neck: Normal range of motion and full passive range of motion without pain. Neck supple. No JVD present. Carotid bruit is not present. No thyromegaly present.  Cardiovascular: Normal rate, regular rhythm and normal heart sounds.  Exam reveals no gallop and no friction rub.   No murmur heard. Pulmonary/Chest: Effort normal and breath sounds normal. She has no wheezes. She has no  rales. Right breast exhibits no inverted nipple, no mass, no nipple discharge, no skin change and no tenderness. Left breast exhibits no inverted nipple, no mass, no nipple discharge, no skin change and no tenderness. Breasts are symmetrical.  Abdominal: Soft. Bowel sounds are normal. She exhibits no distension and no mass. There is no tenderness. There is no rebound and no guarding.  Musculoskeletal:       Right shoulder: Normal.       Left shoulder: Normal.       Cervical back: Normal.  Lymphadenopathy:    She has no cervical adenopathy.  Neurological: She is alert and oriented to person, place, and time. She has normal reflexes. No cranial nerve deficit. She exhibits normal muscle tone. Coordination normal.  Skin: Skin is warm and dry. No rash noted. She is not diaphoretic. No erythema. No pallor.  Psychiatric: She has a  normal mood and affect. Her behavior is normal. Judgment and thought content normal.  Nursing note and vitals reviewed.   Depression screen [facility] 2/9 03/12/2017 01/30/2017 10/06/2016 09/04/2016 05/01/2016  Decreased Interest 0 0 0 0 2  Down, Depressed, Hopeless 0 0 0 0 2  PHQ - 2 Score 0 0 0 0 4  Altered sleeping - - - - 2  Tired, decreased energy - - - - 1  Change in appetite - - - - 3  Feeling bad or failure about yourself  - - - - 1  Trouble concentrating - - - - 0  Moving slowly or fidgety/restless - - - - 1  Suicidal thoughts - - - - 0  PHQ-9 Score - - - - 12  Difficult doing work/chores - - - - Somewhat difficult   Fall Risk  03/12/2017 10/06/2016 09/04/2016 05/01/2016 03/06/2016  Falls in the past year? No No No No No       Assessment & Plan:   1. Routine physical examination   2. Essential hypertension, benign   3. Gastroesophageal reflux disease without esophagitis   4. Type 2 diabetes mellitus without complication, without long-term current use of insulin ([place])   5. Anxiety and depression   6. Pure hypercholesterolemia   7. Class 1 obesity due to excess calories with serious comorbidity and body mass index (BMI) of 34.0 to 34.9 in adult    -anticipatory guidance ---exercise, weight loss, low-fat food choices. -obtain labs for chronic disease management. -refills provided.   Orders Placed This Encounter  Procedures  . CBC with Differential/Platelet  . Comprehensive metabolic panel    Order Specific Question:   Has the patient fasted?    Answer:   Yes  . Hemoglobin A1c  . Lipid panel    Order Specific Question:   Has the patient fasted?    Answer:   Yes  . Microalbumin, urine  . TSH  . POCT urinalysis dipstick  . HM Diabetes Foot Exam   Meds ordered this encounter  Medications  . ACIPHEX 20 MG tablet    Sig: Take 2 tablets (40 mg total) by mouth daily.    Dispense:  180 tablet    Refill:  3  . ALPRAZolam (XANAX) 0.5 MG tablet    Sig: Take 1 tablet (0.5 mg total)  by mouth 2 (two) times daily as needed. for anxiety    Dispense:  60 tablet    Refill:  5  . buPROPion (WELLBUTRIN XL) 150 MG 24 hr tablet    Sig: Take 1 tablet (150 mg total) by mouth daily.  Dispense:  90 tablet    Refill:  3  . citalopram (CELEXA) 20 MG tablet    Sig: Take 1 tablet (20 mg total) by mouth daily.    Dispense:  90 tablet    Refill:  3  . fluticasone (FLONASE) 50 MCG/ACT nasal spray    Sig: Place 2 sprays into both nostrils daily.    Dispense:  48 g    Refill:  3  . glucose blood (ONE TOUCH ULTRA TEST) test strip    Sig: Check blood sugar once daily    Dispense:  100 each    Refill:  3  . hydrocortisone (ANUCORT-HC) 25 MG suppository    Sig: Place 1 suppository (25 mg total) rectally 2 (two) times daily.    Dispense:  24 suppository    Refill:  3  . Lancets (ONETOUCH ULTRASOFT) lancets    Sig: Check sugar once daily    Dispense:  100 each    Refill:  3  . lisinopril-hydrochlorothiazide (PRINZIDE,ZESTORETIC) 20-12.5 MG tablet    Sig: Take 2 tablets by mouth daily.    Dispense:  180 tablet    Refill:  3  . metFORMIN (GLUCOPHAGE) 500 MG tablet    Sig: TAKE 1 TABLET(500 MG) BY MOUTH TWICE DAILY WITH A MEAL    Dispense:  180 tablet    Refill:  3    Needs diabetes office visit for more refills  . rosuvastatin (CRESTOR) 10 MG tablet    Sig: TAKE 1 TABLET(10 MG) BY MOUTH DAILY    Dispense:  90 tablet    Refill:  3    Return in about 6 months (around 09/12/2017) for recheck diabetes, high blood pressure, high cholesterol.   Delesia Martinek Elayne Guerin, M.D. Primary Care at [facility] previously [facility] [address] [place], [place]  [address] [phone] phone [phone] fax

## 2017-03-13 LAB — LIPID PANEL
Chol/HDL Ratio: 3.2 ratio units (ref 0.0–4.4)
Cholesterol, Total: 160 mg/dL (ref 100–199)
HDL: 50 mg/dL (ref >39)
LDL Calculated: 90 mg/dL (ref 0–99)
Triglycerides: 102 mg/dL (ref 0–149)
VLDL CHOLESTEROL CAL: 20 mg/dL (ref 5–40)

## 2017-03-13 LAB — COMPREHENSIVE METABOLIC PANEL
A/G RATIO: 2 (ref 1.2–2.2)
ALBUMIN: 4.5 g/dL (ref 3.5–5.5)
ALT: 27 IU/L (ref 0–32)
AST: 22 IU/L (ref 0–40)
Alkaline Phosphatase: 92 IU/L (ref 39–117)
BUN / CREAT RATIO: 15 (ref 9–23)
BUN: 13 mg/dL (ref 6–24)
Bilirubin Total: 0.5 mg/dL (ref 0.0–1.2)
CALCIUM: 9.7 mg/dL (ref 8.7–10.2)
CO2: 24 mmol/L (ref 18–29)
CREATININE: 0.86 mg/dL (ref 0.57–1.00)
Chloride: 99 mmol/L (ref 96–106)
GFR, EST AFRICAN AMERICAN: 86 mL/min/{1.73_m2} (ref >59)
GFR, EST NON AFRICAN AMERICAN: 74 mL/min/{1.73_m2} (ref >59)
Globulin, Total: 2.2 g/dL (ref 1.5–4.5)
Glucose: 102 mg/dL — ABNORMAL HIGH (ref 65–99)
Potassium: 4.1 mmol/L (ref 3.5–5.2)
SODIUM: 138 mmol/L (ref 134–144)
TOTAL PROTEIN: 6.7 g/dL (ref 6.0–8.5)

## 2017-03-13 LAB — CBC WITH DIFFERENTIAL/PLATELET
BASOS: 0 %
Basophils Absolute: 0 10*3/uL (ref 0.0–0.2)
EOS (ABSOLUTE): 0.1 10*3/uL (ref 0.0–0.4)
Eos: 1 %
HEMATOCRIT: 44.6 % (ref 34.0–46.6)
Hemoglobin: 15.4 g/dL (ref 11.1–15.9)
IMMATURE GRANULOCYTES: 0 %
Immature Grans (Abs): 0 10*3/uL (ref 0.0–0.1)
LYMPHS ABS: 3.3 10*3/uL — AB (ref 0.7–3.1)
Lymphs: 35 %
MCH: 30.9 pg (ref 26.6–33.0)
MCHC: 34.5 g/dL (ref 31.5–35.7)
MCV: 90 fL (ref 79–97)
MONOS ABS: 0.8 10*3/uL (ref 0.1–0.9)
Monocytes: 9 %
NEUTROS ABS: 5.1 10*3/uL (ref 1.4–7.0)
NEUTROS PCT: 55 %
PLATELETS: 278 10*3/uL (ref 150–379)
RBC: 4.98 x10E6/uL (ref 3.77–5.28)
RDW: 14.3 % (ref 12.3–15.4)
WBC: 9.3 10*3/uL (ref 3.4–10.8)

## 2017-03-13 LAB — TSH: TSH: 0.921 u[IU]/mL (ref 0.450–4.500)

## 2017-03-13 LAB — HEMOGLOBIN A1C
ESTIMATED AVERAGE GLUCOSE: 128 mg/dL
Hgb A1c MFr Bld: 6.1 % — ABNORMAL HIGH (ref 4.8–5.6)

## 2017-03-13 LAB — MICROALBUMIN, URINE: MICROALBUM., U, RANDOM: 22.2 ug/mL

## 2017-04-08 ENCOUNTER — Ambulatory Visit (INDEPENDENT_AMBULATORY_CARE_PROVIDER_SITE_OTHER): Payer: BLUE CROSS/BLUE SHIELD | Admitting: Psychology

## 2017-04-08 DIAGNOSIS — F4323 Adjustment disorder with mixed anxiety and depressed mood: Secondary | ICD-10-CM | POA: Diagnosis not present

## 2017-05-13 ENCOUNTER — Ambulatory Visit (INDEPENDENT_AMBULATORY_CARE_PROVIDER_SITE_OTHER): Payer: BLUE CROSS/BLUE SHIELD | Admitting: Psychology

## 2017-05-13 ENCOUNTER — Other Ambulatory Visit: Payer: Self-pay | Admitting: Family Medicine

## 2017-05-13 DIAGNOSIS — F4323 Adjustment disorder with mixed anxiety and depressed mood: Secondary | ICD-10-CM

## 2017-05-26 ENCOUNTER — Other Ambulatory Visit: Payer: Self-pay | Admitting: Family Medicine

## 2017-05-27 NOTE — Telephone Encounter (Signed)
02/2017 last ov and refill

## 2017-06-10 ENCOUNTER — Ambulatory Visit (INDEPENDENT_AMBULATORY_CARE_PROVIDER_SITE_OTHER): Payer: BLUE CROSS/BLUE SHIELD | Admitting: Psychology

## 2017-06-10 DIAGNOSIS — F4323 Adjustment disorder with mixed anxiety and depressed mood: Secondary | ICD-10-CM | POA: Diagnosis not present

## 2017-06-13 DIAGNOSIS — H401121 Primary open-angle glaucoma, left eye, mild stage: Secondary | ICD-10-CM | POA: Diagnosis not present

## 2017-07-09 ENCOUNTER — Ambulatory Visit (INDEPENDENT_AMBULATORY_CARE_PROVIDER_SITE_OTHER): Payer: BLUE CROSS/BLUE SHIELD | Admitting: Psychology

## 2017-07-09 DIAGNOSIS — F4323 Adjustment disorder with mixed anxiety and depressed mood: Secondary | ICD-10-CM

## 2017-08-06 ENCOUNTER — Ambulatory Visit (INDEPENDENT_AMBULATORY_CARE_PROVIDER_SITE_OTHER): Payer: BLUE CROSS/BLUE SHIELD | Admitting: Psychology

## 2017-08-06 DIAGNOSIS — F4323 Adjustment disorder with mixed anxiety and depressed mood: Secondary | ICD-10-CM

## 2017-09-02 ENCOUNTER — Ambulatory Visit: Payer: BLUE CROSS/BLUE SHIELD | Admitting: Psychology

## 2017-09-04 ENCOUNTER — Ambulatory Visit (INDEPENDENT_AMBULATORY_CARE_PROVIDER_SITE_OTHER): Payer: BLUE CROSS/BLUE SHIELD | Admitting: Psychology

## 2017-09-04 DIAGNOSIS — F4323 Adjustment disorder with mixed anxiety and depressed mood: Secondary | ICD-10-CM | POA: Diagnosis not present

## 2017-09-11 ENCOUNTER — Ambulatory Visit (INDEPENDENT_AMBULATORY_CARE_PROVIDER_SITE_OTHER): Payer: BLUE CROSS/BLUE SHIELD | Admitting: Family Medicine

## 2017-09-11 ENCOUNTER — Encounter: Payer: Self-pay | Admitting: Family Medicine

## 2017-09-11 VITALS — BP 124/85 | HR 80 | Temp 98.4°F | Resp 16 | Ht 65.35 in | Wt 217.0 lb

## 2017-09-11 DIAGNOSIS — F329 Major depressive disorder, single episode, unspecified: Secondary | ICD-10-CM | POA: Diagnosis not present

## 2017-09-11 DIAGNOSIS — Z6834 Body mass index (BMI) 34.0-34.9, adult: Secondary | ICD-10-CM | POA: Diagnosis not present

## 2017-09-11 DIAGNOSIS — F419 Anxiety disorder, unspecified: Secondary | ICD-10-CM | POA: Diagnosis not present

## 2017-09-11 DIAGNOSIS — E7800 Pure hypercholesterolemia, unspecified: Secondary | ICD-10-CM

## 2017-09-11 DIAGNOSIS — Z23 Encounter for immunization: Secondary | ICD-10-CM

## 2017-09-11 DIAGNOSIS — I10 Essential (primary) hypertension: Secondary | ICD-10-CM | POA: Diagnosis not present

## 2017-09-11 DIAGNOSIS — F32A Depression, unspecified: Secondary | ICD-10-CM

## 2017-09-11 DIAGNOSIS — E119 Type 2 diabetes mellitus without complications: Secondary | ICD-10-CM

## 2017-09-11 DIAGNOSIS — L659 Nonscarring hair loss, unspecified: Secondary | ICD-10-CM

## 2017-09-11 DIAGNOSIS — J301 Allergic rhinitis due to pollen: Secondary | ICD-10-CM

## 2017-09-11 DIAGNOSIS — E6609 Other obesity due to excess calories: Secondary | ICD-10-CM

## 2017-09-11 MED ORDER — AZELASTINE HCL 0.1 % NA SOLN: 2.0000 | Freq: Two times a day (BID) | NASAL | 12 refills | Status: DC

## 2017-09-11 MED ORDER — ALPRAZOLAM 0.5 MG PO TABS: 0.5000 mg | ORAL_TABLET | Freq: Every day | ORAL | 5 refills | Status: DC | PRN

## 2017-09-11 MED ORDER — ZOSTER VAC RECOMB ADJUVANTED 50 MCG/0.5ML IM SUSR: 0.5000 mL | Freq: Once | INTRAMUSCULAR | 1 refills | Status: AC

## 2017-09-11 NOTE — Patient Instructions (Addendum)
   IF you received an x-ray today, you will receive an invoice from [facility]. Please contact [facility] at [phone] with questions or concerns regarding your invoice.   IF you received labwork today, you will receive an invoice from [facility]. Please contact [facility] at [phone] with questions or concerns regarding your invoice.   Our billing staff will not be able to assist you with questions regarding bills from these companies.  You will be contacted with the lab results as soon as they are available. The fastest way to get your results is to activate your My Chart account. Instructions are located on the last page of this paperwork. If you have not heard from us regarding the results in 2 weeks, please contact this office.     Diabetes Mellitus and Exercise Exercising regularly is important for your overall health, especially when you have diabetes (diabetes mellitus). Exercising is not only about losing weight. It has many health benefits, such as increasing muscle strength and bone density and reducing body fat and stress. This leads to improved fitness, flexibility, and endurance, all of which result in better overall health. Exercise has additional benefits for people with diabetes, including:  Reducing appetite.  Helping to lower and control blood glucose.  Lowering blood pressure.  Helping to control amounts of fatty substances (lipids) in the blood, such as cholesterol and triglycerides.  Helping the body to respond better to insulin (improving insulin sensitivity).  Reducing how much insulin the body needs.  Decreasing the risk for heart disease by: ? Lowering cholesterol and triglyceride levels. ? Increasing the levels of good cholesterol. ? Lowering blood glucose levels.  What is my activity plan? Your health care provider or certified diabetes educator can help you make a plan for the type and frequency of exercise (activity plan) that  works for you. Make sure that you:  Do at least 150 minutes of moderate-intensity or vigorous-intensity exercise each week. This could be brisk walking, biking, or water aerobics. ? Do stretching and strength exercises, such as yoga or weightlifting, at least 2 times a week. ? Spread out your activity over at least 3 days of the week.  Get some form of physical activity every day. ? Do not go more than 2 days in a row without some kind of physical activity. ? Avoid being inactive for more than 90 minutes at a time. Take frequent breaks to walk or stretch.  Choose a type of exercise or activity that you enjoy, and set realistic goals.  Start slowly, and gradually increase the intensity of your exercise over time.  What do I need to know about managing my diabetes?  Check your blood glucose before and after exercising. ? If your blood glucose is higher than 240 mg/dL (13.3 mmol/L) before you exercise, check your urine for ketones. If you have ketones in your urine, do not exercise until your blood glucose returns to normal.  Know the symptoms of low blood glucose (hypoglycemia) and how to treat it. Your risk for hypoglycemia increases during and after exercise. Common symptoms of hypoglycemia can include: ? Hunger. ? Anxiety. ? Sweating and feeling clammy. ? Confusion. ? Dizziness or feeling light-headed. ? Increased heart rate or palpitations. ? Blurry vision. ? Tingling or numbness around the mouth, lips, or tongue. ? Tremors or shakes. ? Irritability.  Keep a rapid-acting carbohydrate snack available before, during, and after exercise to help prevent or treat hypoglycemia.  Avoid injecting insulin into areas of the body   that are going to be exercised. For example, avoid injecting insulin into: ? The arms, when playing tennis. ? The legs, when jogging.  Keep records of your exercise habits. Doing this can help you and your health care provider adjust your diabetes management plan  as needed. Write down: ? Food that you eat before and after you exercise. ? Blood glucose levels before and after you exercise. ? The type and amount of exercise you have done. ? When your insulin is expected to peak, if you use insulin. Avoid exercising at times when your insulin is peaking.  When you start a new exercise or activity, work with your health care provider to make sure the activity is safe for you, and to adjust your insulin, medicines, or food intake as needed.  Drink plenty of water while you exercise to prevent dehydration or heat stroke. Drink enough fluid to keep your urine clear or pale yellow. This information is not intended to replace advice given to you by your health care provider. Make sure you discuss any questions you have with your health care provider. Document Released: 03/01/2004 Document Revised: 06/29/2016 Document Reviewed: 05/21/2016 Elsevier Interactive Patient Education  2018 [employer]  

## 2017-09-11 NOTE — Progress Notes (Signed)
Subjective:    Patient ID: Cheryl Morton, female    DOB: 1956/09/01, 61 y.o.   MRN: [id number]  09/11/2017  Hypertension (6 month follow-up); Diabetes; and Hyperlipidemia   HPI This 61 y.o. female presents for evaluation of six month follow-up for DMII, hypercholesterolemia, HTN, anxiety/depression.  No changes to management made at last visit.  All labs normal.   HgbA1c 6.1 at last visit. Sugar 119.  BP only when feels poorly.  Ears are tickly; itchy.  Mild nasal congestion.  No major congestion; no rhinorrhea; +sneezing.  Taking Flonase and Zyrtec.   Hair loss: has an appointment with dermatology last year.  Has an upcoming appointment in [facility]. Therapist's name is Lattie Haw in [place] with [facility].  Appointment in [place] on November 25, 2017. Taking Xanax once per day maybe.  Weekends does not take.    BP Readings from Last 3 Encounters:  09/11/17 124/85  03/12/17 120/83  01/30/17 102/76   Wt Readings from Last 3 Encounters:  09/11/17 217 lb (98.4 kg)  03/12/17 213 lb (96.6 kg)  01/30/17 209 lb (94.8 kg)   Immunization History  Administered Date(s) Administered  . Influenza Split 10/18/2013, 10/24/2014, 10/25/2015  . Influenza,inj,Quad PF,6+ Mos 09/04/2016, 09/11/2017  . Pneumococcal Conjugate-13 02/23/2015  . Pneumococcal Polysaccharide-23 03/06/2016  . Tdap 02/17/2014    Review of Systems  Constitutional: Negative for chills, diaphoresis, fatigue and fever.  HENT: Positive for congestion, postnasal drip and rhinorrhea. Negative for ear pain and sore throat.   Eyes: Negative for visual disturbance.  Respiratory: Negative for cough and shortness of breath.   Cardiovascular: Negative for chest pain, palpitations and leg swelling.  Gastrointestinal: Negative for abdominal pain, constipation, diarrhea, nausea and vomiting.  Endocrine: Negative for cold intolerance, heat intolerance, polydipsia, polyphagia and polyuria.  Neurological: Negative for  dizziness, tremors, seizures, syncope, facial asymmetry, speech difficulty, weakness, light-headedness, numbness and headaches.  Psychiatric/Behavioral: Negative for dysphoric mood. The patient is not nervous/anxious.     Past Medical History:  Diagnosis Date  . Allergy   . Anxiety   . Depression   . Diabetes mellitus without complication ([place])   . Glaucoma   . Hypertension    Past Surgical History:  Procedure Laterality Date  . ABDOMINAL HYSTERECTOMY     Fibroids/DUB.  Ovaries intact.   Allergies  Allergen Reactions  . Biaxin [Clarithromycin] Rash    Social History   Social History  . Marital status: Divorced    Spouse name: N/A  . Number of children: 0  . Years of education: N/A   Occupational History  . Camera operator   . DIRECTOR OF SALES [employer]   Social History Main Topics  . Smoking status: Former Smoker    Packs/day: 0.25    Types: Cigarettes    Quit date: 12/24/2010  . Smokeless tobacco: Never Used  . Alcohol use 1.8 oz/week    3 Glasses of wine per week     Comment: wine  . Drug use: No  . Sexual activity: No   Other Topics Concern  . 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      Tobacco: none; quit age 58.      Alcohol:  Socially;  3-4 times per week; wine one glass each episode      Exercise:  Sporadic.  Walking some. No yoga in 2018.      Seatbelt: 100%; never texting while driving   Family History  Problem Relation Age of Onset  . Hypertension Mother   . Cancer Mother        lung cancer  . Multiple sclerosis Sister   . Diabetes Maternal Grandmother   . Hypertension Maternal Grandmother   . Arthritis Sister   . Mental retardation Sister        Objective:    BP 124/85   Pulse 80   Temp 98.4 F (36.9 C) (Oral)   Resp  16   Ht 5' 5.35" (1.66 m)   Wt 217 lb (98.4 kg)   SpO2 97%   BMI 35.72 kg/m  Physical Exam  Constitutional: She is oriented to person, place, and time. She appears well-developed and well-nourished. No distress.  HENT:  Head: Normocephalic and atraumatic.  Right Ear: External ear normal.  Left Ear: External ear normal.  Nose: Nose normal.  Mouth/Throat: Oropharynx is clear and moist.  Eyes: Pupils are equal, round, and reactive to light. Conjunctivae and EOM are normal.  Neck: Normal range of motion. Neck supple. Carotid bruit is not present. No thyromegaly present.  Cardiovascular: Normal rate, regular rhythm, normal heart sounds and intact distal pulses.  Exam reveals no gallop and no friction rub.   No murmur heard. Pulmonary/Chest: Effort normal and breath sounds normal. She has no wheezes. She has no rales.  Abdominal: Soft. Bowel sounds are normal. She exhibits no distension and no mass. There is no tenderness. There is no rebound and no guarding.  Lymphadenopathy:    She has no cervical adenopathy.  Neurological: She is alert and oriented to person, place, and time. No cranial nerve deficit.  Skin: Skin is warm and dry. No rash noted. She is not diaphoretic. No erythema. No pallor.  Psychiatric: She has a normal mood and affect. Her behavior is normal.    No results found. Depression screen [facility] 2/9 09/11/2017 03/12/2017 01/30/2017 10/06/2016 09/04/2016  Decreased Interest 0 0 0 0 0  Down, Depressed, Hopeless 0 0 0 0 0  PHQ - 2 Score 0 0 0 0 0  Altered sleeping - - - - -  Tired, decreased energy - - - - -  Change in appetite - - - - -  Feeling bad or failure about yourself  - - - - -  Trouble concentrating - - - - -  Moving slowly or fidgety/restless - - - - -  Suicidal thoughts - - - - -  PHQ-9 Score - - - - -  Difficult doing work/chores - - - - -   Fall Risk  09/11/2017 03/12/2017 10/06/2016 09/04/2016 05/01/2016  Falls in the past year? No No No No No        Assessment  & Plan:   1. Type 2 diabetes mellitus without complication, without long-term current use of insulin ([place])   2. Essential hypertension, benign   3. Anxiety and depression   4. Pure hypercholesterolemia   5. Class 1 obesity due to excess calories with serious comorbidity and body mass index (BMI) of 34.0 to 34.9 in adult   6. Need for prophylactic vaccination and inoculation against influenza   7. Alopecia    -controlled chronic medical conditions; obtain labs for chronic disease management. -persistent alopecia; has two upcoming derm appointments. - I recommend  weight loss, exercise, and low-carbohydrate low-sugar food choices. You should AVOID: regular sodas, sweetened tea, fruit juices.  You should LIMIT: breads, pastas, rice, potatoes, and desserts/sweets.  I would recommend limiting your total carbohydrate intake per meal to 45 grams; I would limit your total carbohydrate intake per snack to 30 grams.  I would also have a goal of 60 grams of protein intake per day; this would equal 10-15 grams of protein per meal and 5-10 grams of protein per snack. -recommend weight loss, exercise for 30-60 minutes five days per week; recommend 1200 kcal restriction per day with a minimum of 60 grams of protein per day. -uncontrolled allergic rhinitis; add Astelin nasal spray. -using Xanax once weekly on average currently; seeing therapist/Lisa at [facility].   Orders Placed This Encounter  Procedures  . Flu Vaccine QUAD 36+ mos IM  . CBC with Differential/Platelet  . Comprehensive metabolic panel    Order Specific Question:   Has the patient fasted?    Answer:   Yes  . Hemoglobin A1c  . Lipid panel    Order Specific Question:   Has the patient fasted?    Answer:   Yes   Meds ordered this encounter  Medications  . Zoster Vac Recomb Adjuvanted [facility]) injection    Sig: Inject 0.5 mLs into the muscle once.    Dispense:  0.5 mL    Refill:  1  . azelastine (ASTELIN) 0.1 % nasal  spray    Sig: Place 2 sprays into both nostrils 2 (two) times daily. Use in each nostril as directed    Dispense:  30 mL    Refill:  12  . ALPRAZolam (XANAX) 0.5 MG tablet    Sig: Take 1 tablet (0.5 mg total) by mouth daily as needed. for anxiety    Dispense:  30 tablet    Refill:  5    Return in about 6 months (around 03/11/2018) for complete physical examiniation.   Teighan Aubert Elayne Guerin, M.D. Primary Care at [facility] previously [facility] [address] [place], [place]  [address] [phone] phone [phone] fax

## 2017-09-12 LAB — CBC WITH DIFFERENTIAL/PLATELET
BASOS ABS: 0 10*3/uL (ref 0.0–0.2)
Basos: 0 %
EOS (ABSOLUTE): 0.1 10*3/uL (ref 0.0–0.4)
Eos: 2 %
Hematocrit: 47.3 % — ABNORMAL HIGH (ref 34.0–46.6)
Hemoglobin: 15.6 g/dL (ref 11.1–15.9)
IMMATURE GRANS (ABS): 0 10*3/uL (ref 0.0–0.1)
IMMATURE GRANULOCYTES: 0 %
LYMPHS: 33 %
Lymphocytes Absolute: 2.7 10*3/uL (ref 0.7–3.1)
MCH: 29.9 pg (ref 26.6–33.0)
MCHC: 33 g/dL (ref 31.5–35.7)
MCV: 91 fL (ref 79–97)
Monocytes Absolute: 0.9 10*3/uL (ref 0.1–0.9)
Monocytes: 11 %
Neutrophils Absolute: 4.4 10*3/uL (ref 1.4–7.0)
Neutrophils: 54 %
PLATELETS: 281 10*3/uL (ref 150–379)
RBC: 5.21 x10E6/uL (ref 3.77–5.28)
RDW: 14.2 % (ref 12.3–15.4)
WBC: 8.2 10*3/uL (ref 3.4–10.8)

## 2017-09-12 LAB — LIPID PANEL
Chol/HDL Ratio: 3.7 ratio (ref 0.0–4.4)
Cholesterol, Total: 154 mg/dL (ref 100–199)
HDL: 42 mg/dL (ref >39)
LDL Calculated: 83 mg/dL (ref 0–99)
TRIGLYCERIDES: 146 mg/dL (ref 0–149)
VLDL CHOLESTEROL CAL: 29 mg/dL (ref 5–40)

## 2017-09-12 LAB — COMPREHENSIVE METABOLIC PANEL
ALK PHOS: 98 IU/L (ref 39–117)
ALT: 31 IU/L (ref 0–32)
AST: 24 IU/L (ref 0–40)
Albumin/Globulin Ratio: 1.9 (ref 1.2–2.2)
Albumin: 4.6 g/dL (ref 3.6–4.8)
BILIRUBIN TOTAL: 0.6 mg/dL (ref 0.0–1.2)
BUN/Creatinine Ratio: 17 (ref 12–28)
BUN: 16 mg/dL (ref 8–27)
CHLORIDE: 99 mmol/L (ref 96–106)
CO2: 22 mmol/L (ref 20–29)
Calcium: 10.2 mg/dL (ref 8.7–10.3)
Creatinine, Ser: 0.95 mg/dL (ref 0.57–1.00)
GFR calc Af Amer: 75 mL/min/{1.73_m2} (ref >59)
GFR calc non Af Amer: 65 mL/min/{1.73_m2} (ref >59)
GLUCOSE: 115 mg/dL — AB (ref 65–99)
Globulin, Total: 2.4 g/dL (ref 1.5–4.5)
Potassium: 4.3 mmol/L (ref 3.5–5.2)
Sodium: 138 mmol/L (ref 134–144)
Total Protein: 7 g/dL (ref 6.0–8.5)

## 2017-09-12 LAB — HEMOGLOBIN A1C
ESTIMATED AVERAGE GLUCOSE: 137 mg/dL
HEMOGLOBIN A1C: 6.4 % — AB (ref 4.8–5.6)

## 2017-09-20 DIAGNOSIS — J301 Allergic rhinitis due to pollen: Secondary | ICD-10-CM | POA: Insufficient documentation

## 2017-09-26 ENCOUNTER — Other Ambulatory Visit: Payer: Self-pay | Admitting: Family Medicine

## 2017-09-26 DIAGNOSIS — Z1231 Encounter for screening mammogram for malignant neoplasm of breast: Secondary | ICD-10-CM

## 2017-10-01 ENCOUNTER — Ambulatory Visit (INDEPENDENT_AMBULATORY_CARE_PROVIDER_SITE_OTHER): Payer: BLUE CROSS/BLUE SHIELD | Admitting: Psychology

## 2017-10-01 DIAGNOSIS — F4323 Adjustment disorder with mixed anxiety and depressed mood: Secondary | ICD-10-CM | POA: Diagnosis not present

## 2017-10-08 ENCOUNTER — Ambulatory Visit
Admission: RE | Admit: 2017-10-08 | Discharge: 2017-10-08 | Disposition: A | Discharge status: Home / Self Care | Payer: BLUE CROSS/BLUE SHIELD | Source: Ambulatory Visit | Attending: Family Medicine | Admitting: Family Medicine

## 2017-10-08 DIAGNOSIS — Z1231 Encounter for screening mammogram for malignant neoplasm of breast: Secondary | ICD-10-CM

## 2017-10-29 ENCOUNTER — Ambulatory Visit: Payer: BLUE CROSS/BLUE SHIELD | Admitting: Psychology

## 2017-10-29 DIAGNOSIS — F4323 Adjustment disorder with mixed anxiety and depressed mood: Secondary | ICD-10-CM | POA: Diagnosis not present

## 2017-11-12 DIAGNOSIS — L659 Nonscarring hair loss, unspecified: Secondary | ICD-10-CM | POA: Diagnosis not present

## 2017-11-25 DIAGNOSIS — L658 Other specified nonscarring hair loss: Secondary | ICD-10-CM | POA: Diagnosis not present

## 2017-11-25 DIAGNOSIS — L669 Cicatricial alopecia, unspecified: Secondary | ICD-10-CM | POA: Diagnosis not present

## 2017-12-06 ENCOUNTER — Ambulatory Visit: Payer: BLUE CROSS/BLUE SHIELD | Admitting: Psychology

## 2017-12-12 ENCOUNTER — Ambulatory Visit: Payer: BLUE CROSS/BLUE SHIELD | Admitting: Psychology

## 2017-12-12 DIAGNOSIS — F4323 Adjustment disorder with mixed anxiety and depressed mood: Secondary | ICD-10-CM

## 2017-12-19 DIAGNOSIS — H401121 Primary open-angle glaucoma, left eye, mild stage: Secondary | ICD-10-CM | POA: Diagnosis not present

## 2018-01-26 ENCOUNTER — Other Ambulatory Visit: Payer: Self-pay | Admitting: Family Medicine

## 2018-01-28 ENCOUNTER — Ambulatory Visit (INDEPENDENT_AMBULATORY_CARE_PROVIDER_SITE_OTHER): Payer: BLUE CROSS/BLUE SHIELD | Admitting: Psychology

## 2018-01-28 DIAGNOSIS — F4323 Adjustment disorder with mixed anxiety and depressed mood: Secondary | ICD-10-CM | POA: Diagnosis not present

## 2018-02-23 ENCOUNTER — Other Ambulatory Visit: Payer: Self-pay | Admitting: Family Medicine

## 2018-02-24 NOTE — Telephone Encounter (Signed)
LOV 09-11-17 with Dr. Tamala Julian / Refill request for Celexa / Last written in 02/2017 / Will send to provider for approval /

## 2018-02-25 NOTE — Telephone Encounter (Signed)
Refill for Celexa approved; upcoming appointment on 03-12-18.

## 2018-03-05 ENCOUNTER — Ambulatory Visit: Payer: BLUE CROSS/BLUE SHIELD | Admitting: Psychology

## 2018-03-12 ENCOUNTER — Encounter: Payer: BLUE CROSS/BLUE SHIELD | Admitting: Family Medicine

## 2018-03-14 ENCOUNTER — Ambulatory Visit (INDEPENDENT_AMBULATORY_CARE_PROVIDER_SITE_OTHER): Payer: BLUE CROSS/BLUE SHIELD | Admitting: Psychology

## 2018-03-14 DIAGNOSIS — F4323 Adjustment disorder with mixed anxiety and depressed mood: Secondary | ICD-10-CM | POA: Diagnosis not present

## 2018-03-19 ENCOUNTER — Other Ambulatory Visit: Payer: Self-pay | Admitting: Family Medicine

## 2018-04-30 ENCOUNTER — Encounter: Payer: Self-pay | Admitting: Family Medicine

## 2018-04-30 ENCOUNTER — Other Ambulatory Visit: Payer: Self-pay

## 2018-04-30 ENCOUNTER — Ambulatory Visit (INDEPENDENT_AMBULATORY_CARE_PROVIDER_SITE_OTHER): Payer: BLUE CROSS/BLUE SHIELD | Admitting: Family Medicine

## 2018-04-30 VITALS — BP 118/82 | HR 92 | Temp 98.7°F | Resp 16 | Ht 65.55 in | Wt 215.0 lb

## 2018-04-30 DIAGNOSIS — E119 Type 2 diabetes mellitus without complications: Secondary | ICD-10-CM | POA: Diagnosis not present

## 2018-04-30 DIAGNOSIS — K219 Gastro-esophageal reflux disease without esophagitis: Secondary | ICD-10-CM | POA: Diagnosis not present

## 2018-04-30 DIAGNOSIS — J301 Allergic rhinitis due to pollen: Secondary | ICD-10-CM

## 2018-04-30 DIAGNOSIS — E6609 Other obesity due to excess calories: Secondary | ICD-10-CM

## 2018-04-30 DIAGNOSIS — F32A Depression, unspecified: Secondary | ICD-10-CM

## 2018-04-30 DIAGNOSIS — Z0000 Encounter for general adult medical examination without abnormal findings: Secondary | ICD-10-CM

## 2018-04-30 DIAGNOSIS — Z6834 Body mass index (BMI) 34.0-34.9, adult: Secondary | ICD-10-CM

## 2018-04-30 DIAGNOSIS — F329 Major depressive disorder, single episode, unspecified: Secondary | ICD-10-CM

## 2018-04-30 DIAGNOSIS — I10 Essential (primary) hypertension: Secondary | ICD-10-CM | POA: Diagnosis not present

## 2018-04-30 DIAGNOSIS — F419 Anxiety disorder, unspecified: Secondary | ICD-10-CM

## 2018-04-30 DIAGNOSIS — E7800 Pure hypercholesterolemia, unspecified: Secondary | ICD-10-CM | POA: Diagnosis not present

## 2018-04-30 LAB — POCT URINALYSIS DIP (MANUAL ENTRY)
Bilirubin, UA: NEGATIVE
GLUCOSE UA: NEGATIVE mg/dL
Ketones, POC UA: NEGATIVE mg/dL
Leukocytes, UA: NEGATIVE
NITRITE UA: NEGATIVE
PROTEIN UA: NEGATIVE mg/dL
RBC UA: NEGATIVE
Spec Grav, UA: <=1.005 — AB (ref 1.010–1.025)
UROBILINOGEN UA: 0.2 U/dL
pH, UA: 6.5 (ref 5.0–8.0)

## 2018-04-30 MED ORDER — ALPRAZOLAM 0.5 MG PO TABS: ORAL_TABLET | ORAL | 5 refills | Status: DC

## 2018-04-30 MED ORDER — AZELASTINE HCL 0.1 % NA SOLN: 2.0000 | Freq: Two times a day (BID) | NASAL | 12 refills | Status: AC

## 2018-04-30 MED ORDER — LISINOPRIL-HYDROCHLOROTHIAZIDE 20-12.5 MG PO TABS: 2.0000 | ORAL_TABLET | Freq: Every day | ORAL | 3 refills | Status: DC

## 2018-04-30 MED ORDER — ROSUVASTATIN CALCIUM 10 MG PO TABS: ORAL_TABLET | ORAL | 3 refills | Status: DC

## 2018-04-30 MED ORDER — CITALOPRAM HYDROBROMIDE 40 MG PO TABS: 40.0000 mg | ORAL_TABLET | Freq: Every day | ORAL | 1 refills | Status: DC

## 2018-04-30 MED ORDER — BUPROPION HCL ER (XL) 150 MG PO TB24: ORAL_TABLET | ORAL | 1 refills | Status: DC

## 2018-04-30 MED ORDER — FLUTICASONE PROPIONATE 50 MCG/ACT NA SUSP: NASAL | 3 refills | Status: AC

## 2018-04-30 MED ORDER — METFORMIN HCL 500 MG PO TABS: ORAL_TABLET | ORAL | 3 refills | Status: DC

## 2018-04-30 MED ORDER — ACIPHEX 20 MG PO TBEC: 40.0000 mg | DELAYED_RELEASE_TABLET | Freq: Every day | ORAL | 1 refills | Status: DC

## 2018-04-30 NOTE — Progress Notes (Signed)
Subjective:    Patient ID: Cheryl Morton, female    DOB: [birth year], 62 y.o.   MRN: [id number]  04/30/2018  Annual Exam    HPI This 62 y.o. female presents for routine physical examination.  Last physical:  03-12-2017 Pap smear:  2012; hysterectomy; OVARIES INTACT.  Endometriosis.   Mammogram:  09/2017 Colonoscopy:  2012 WNL Bone density:  n/a Eye exam:  07/16/2017 Dental exam:  Last week   Visual Acuity Screening   Right eye Left eye Both eyes  Without correction:     With correction: 20/20 20/25 20/20     BP Readings from Last 3 Encounters:  04/30/18 118/82  09/11/17 124/85  03/12/17 120/83   Wt Readings from Last 3 Encounters:  04/30/18 215 lb (97.5 kg)  09/11/17 217 lb (98.4 kg)  03/12/17 213 lb (96.6 kg)   Immunization History  Administered Date(s) Administered  . Influenza Split 10/18/2013, 10/24/2014, 10/25/2015  . Influenza,inj,Quad PF,6+ Mos 09/04/2016, 09/11/2017  . Pneumococcal Conjugate-13 02/23/2015  . Pneumococcal Polysaccharide-23 03/06/2016  . Tdap 02/17/2014  . Zoster Recombinat (Shingrix) 10/24/2017, 03/24/2018    Review of Systems  Constitutional: Positive for diaphoresis and fatigue. Negative for activity change, appetite change, chills, fever and unexpected weight change.  HENT: Positive for sinus pressure. Negative for congestion, dental problem, drooling, ear discharge, ear pain, facial swelling, hearing loss, mouth sores, nosebleeds, postnasal drip, rhinorrhea, sneezing, sore throat, tinnitus, trouble swallowing and voice change.   Eyes: Negative for photophobia, pain, discharge, redness, itching and visual disturbance.  Respiratory: Negative for apnea, cough, choking, chest tightness, shortness of breath, wheezing and stridor.   Cardiovascular: Negative for chest pain, palpitations and leg swelling.  Gastrointestinal: Negative for abdominal distention, abdominal pain, anal bleeding, blood in stool, constipation, diarrhea, nausea, rectal pain and  vomiting.  Endocrine: Negative for cold intolerance, heat intolerance, polydipsia, polyphagia and polyuria.  Genitourinary: Negative for decreased urine volume, difficulty urinating, dyspareunia, dysuria, enuresis, flank pain, frequency, genital sores, hematuria, menstrual problem, pelvic pain, urgency, vaginal bleeding, vaginal discharge and vaginal pain.       Nocturia x 1.  No urinary leaking.  Musculoskeletal: Negative for arthralgias, back pain, gait problem, joint swelling, myalgias, neck pain and neck stiffness.  Skin: Negative for color change, pallor, rash and wound.  Allergic/Immunologic: Positive for environmental allergies. Negative for food allergies and immunocompromised state.  Neurological: Negative for dizziness, tremors, seizures, syncope, facial asymmetry, speech difficulty, weakness, light-headedness, numbness and headaches.  Hematological: Negative for adenopathy. Does not bruise/bleed easily.  Psychiatric/Behavioral: Negative for agitation, behavioral problems, confusion, decreased concentration, dysphoric mood, hallucinations, self-injury, sleep disturbance and suicidal ideas. The patient is nervous/anxious. The patient is not hyperactive.        Bedtime 1100; wakes up 630-700.    Past Medical History:  Diagnosis Date  . Allergy   . Anxiety   . Depression   . Diabetes mellitus without complication ([place])   . Glaucoma   . Hypertension    Past Surgical History:  Procedure Laterality Date  . ABDOMINAL HYSTERECTOMY     Fibroids/DUB.  Ovaries intact.   Allergies  Allergen Reactions  . Biaxin [Clarithromycin] Rash   Current Outpatient Medications on File Prior to Visit  Medication Sig Dispense Refill  . aspirin 81 MG tablet Take 81 mg by mouth daily.    . Blood Glucose Monitoring Suppl (CONTOUR BLOOD GLUCOSE SYSTEM) DEVI Use to check blood sugar 3 times a week. Dx code: E11.9. 1 Device 0  . glucose blood (ONE TOUCH ULTRA  TEST) test strip Check blood sugar once daily  100 each 3  . hydrocortisone (ANUCORT-HC) 25 MG suppository Place 1 suppository (25 mg total) rectally 2 (two) times daily. 24 suppository 3  . Lancets (ONETOUCH ULTRASOFT) lancets Check sugar once daily 100 each 3   No current facility-administered medications on file prior to visit.    Social History   Socioeconomic History  . Marital status: Divorced    Spouse name: Not on file  . Number of children: 0  . Years of education: Not on file  . Highest education level: Not on file  Occupational History  . Occupation: Camera operator  . Occupation: DIRECTOR OF Scientist, clinical (histocompatibility and immunogenetics): [employer]  Social Needs  . Financial resource strain: Not on file  . Food insecurity:    Worry: Not on file    Inability: Not on file  . Transportation needs:    Medical: Not on file    Non-medical: Not on file  Tobacco Use  . Smoking status: Former Smoker    Packs/day: 0.25    Types: Cigarettes    Last attempt to quit: 12/24/2010    Years since quitting: 7.5  . Smokeless tobacco: Never Used  Substance and Sexual Activity  . Alcohol use: Yes    Alcohol/week: 1.8 oz    Types: 3 Glasses of wine per week    Comment: wine  . Drug use: No  . Sexual activity: Not Currently    Birth control/protection: Post-menopausal, Surgical  Lifestyle  . Physical activity:    Days per week: Not on file    Minutes per session: Not on file  . Stress: Not on file  Relationships  . Social connections:    Talks on phone: Not on file    Gets together: Not on file    Attends religious service: Not on file    Active member of club or organization: Not on file    Attends meetings of clubs or organizations: Not on file    Relationship status: Not on file  . Intimate partner violence:    Fear of current or ex partner: Not on file    Emotionally abused: Not on file    Physically abused: Not on file    Forced sexual activity: Not on file  Other Topics Concern  . 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      Tobacco: none; quit age 57.      Alcohol:  Socially; 3-4 times per week; wine one glass each episode      Exercise:  Sporadic.  Walking some. No yoga in 2019.  Job is very active.      Seatbelt: 100%; never texting while driving   Family History  Problem Relation Age of Onset  . Hypertension Mother   . Cancer Mother        lung cancer  . Multiple sclerosis Sister   . Diabetes Maternal Grandmother   . Hypertension Maternal Grandmother   . Arthritis Sister   . Mental retardation Sister   . Breast cancer Neg Hx        Objective:    BP 118/82   Pulse 92   Temp  98.7 F (37.1 C) (Oral)   Resp 16   Ht 5' 5.55" (1.665 m)   Wt 215 lb (97.5 kg)   SpO2 97%   BMI 35.18 kg/m  Physical Exam  Constitutional: She is oriented to person, place, and time. She appears well-developed and well-nourished. No distress.  HENT:  Head: Normocephalic and atraumatic.  Right Ear: External ear normal.  Left Ear: External ear normal.  Nose: Nose normal.  Mouth/Throat: Oropharynx is clear and moist.  Eyes: Pupils are equal, round, and reactive to light. Conjunctivae and EOM are normal.  Neck: Normal range of motion and full passive range of motion without pain. Neck supple. No JVD present. Carotid bruit is not present. No thyromegaly present.  Cardiovascular: Normal rate, regular rhythm and normal heart sounds. Exam reveals no gallop and no friction rub.  No murmur heard. Pulmonary/Chest: Effort normal and breath sounds normal. She has no wheezes. She has no rales. Right breast exhibits no inverted nipple, no mass, no nipple discharge, no skin change and no tenderness. Left breast exhibits no inverted nipple, no mass, no nipple discharge, no skin change and no tenderness. No  breast swelling, tenderness, discharge or bleeding. Breasts are symmetrical.  Abdominal: Soft. Bowel sounds are normal. She exhibits no distension and no mass. There is no tenderness. There is no rebound and no guarding.  Musculoskeletal:       Right shoulder: Normal.       Left shoulder: Normal.       Cervical back: Normal.  Lymphadenopathy:    She has no cervical adenopathy.  Neurological: She is alert and oriented to person, place, and time. She has normal reflexes. No cranial nerve deficit. She exhibits normal muscle tone. Coordination normal.  Skin: Skin is warm and dry. No rash noted. She is not diaphoretic. No erythema. No pallor.  Psychiatric: She has a normal mood and affect. Her behavior is normal. Judgment and thought content normal.  Nursing note and vitals reviewed.  No results found. Depression screen [facility] 2/9 04/30/2018 04/30/2018 09/11/2017 03/12/2017 01/30/2017  Decreased Interest 0 0 0 0 0  Down, Depressed, Hopeless 0 0 0 0 0  PHQ - 2 Score 0 0 0 0 0  Altered sleeping - - - - -  Tired, decreased energy - - - - -  Change in appetite - - - - -  Feeling bad or failure about yourself  - - - - -  Trouble concentrating - - - - -  Moving slowly or fidgety/restless - - - - -  Suicidal thoughts - - - - -  PHQ-9 Score - - - - -  Difficult doing work/chores - - - - -   Fall Risk  04/30/2018 04/30/2018 09/11/2017 03/12/2017 10/06/2016  Falls in the past year? No No No No No        Assessment & Plan:   1. Routine physical examination   2. Essential hypertension, benign   3. Seasonal allergic rhinitis due to pollen   4. Gastroesophageal reflux disease without esophagitis   5. Type 2 diabetes mellitus without complication, without long-term current use of insulin ([place])   6. Anxiety and depression   7. Class 1 obesity due to excess calories with serious comorbidity and body mass index (BMI) of 34.0 to 34.9 in adult   8. Pure hypercholesterolemia     -anticipatory guidance provided  --- exercise, weight loss, safe driving practices, aspirin 81mg  daily. -obtain age appropriate screening labs and labs for chronic disease  management. -Obesity: Recommend weight loss, exercise for 30-60 minutes five days per week; recommend 1200 kcal restriction per day with a minimum of 60 grams of protein per day.  Eat 3 meals per day. Do not skip meals. Consider having a protein shake as a meal replacement to aid with eliminating meal skipping. Look for products with <220 calories, <7 gm sugar, and 20-30 gm protein.  Eat breakfast within 2 hours of getting up.   Make  your plate non-starchy vegetables,  protein, and  carbohydrates at lunch and dinner.   Aim for at least 64 oz. of calorie-free beverages daily (water, Crystal Light, diet green tea, etc.). Eliminate any sugary beverages such as regular soda, sweet tea, or fruit juice.   Pay attention to hunger and fullness cues.  Stop eating once you feel satisfied; don't wait until you feel full, stuffed, or sick from eating.  Choose lean meats and low fat/fat free dairy products.  Choose foods high in fiber such as fruits, vegetables, and whole grains (brown rice, whole wheat pasta, whole wheat bread, etc.).  Limit foods with added sugar to <7 gm per serving.  Always eat in the kitchen/dining room.  Never eat in the bedroom or in front of the TV.    Orders Placed This Encounter  Procedures  . CBC with Differential/Platelet  . Comprehensive metabolic panel    Order Specific Question:   Has the patient fasted?    Answer:   No  . Hemoglobin A1c  . TSH  . Lipid panel    Order Specific Question:   Has the patient fasted?    Answer:   No  . Microalbumin / creatinine urine ratio  . POCT urinalysis dipstick  . EKG 12-Lead   Meds ordered this encounter  Medications  . ALPRAZolam (XANAX) 0.5 MG tablet    Sig: TAKE 1 TABLET BY MOUTH EVERY DAY AS NEEDED FOR ANXIETY    Dispense:  30 tablet    Refill:  5  . ACIPHEX 20 MG tablet     Sig: Take 2 tablets (40 mg total) by mouth daily.    Dispense:  180 tablet    Refill:  1  . azelastine (ASTELIN) 0.1 % nasal spray    Sig: Place 2 sprays into both nostrils 2 (two) times daily. Use in each nostril as directed    Dispense:  30 mL    Refill:  12  . buPROPion (WELLBUTRIN XL) 150 MG 24 hr tablet    Sig: TAKE 1 TABLET(150 MG) BY MOUTH DAILY    Dispense:  90 tablet    Refill:  1  . fluticasone (FLONASE) 50 MCG/ACT nasal spray    Sig: SHAKE LIQUID AND USE 2 SPRAYS IN EACH NOSTRIL DAILY    Dispense:  48 g    Refill:  3  . lisinopril-hydrochlorothiazide (PRINZIDE,ZESTORETIC) 20-12.5 MG tablet    Sig: Take 2 tablets by mouth daily.    Dispense:  180 tablet    Refill:  3  . metFORMIN (GLUCOPHAGE) 500 MG tablet    Sig: TAKE 1 TABLET(500 MG) BY MOUTH TWICE DAILY WITH A MEAL    Dispense:  180 tablet    Refill:  3  . rosuvastatin (CRESTOR) 10 MG tablet    Sig: TAKE 1 TABLET(10 MG) BY MOUTH DAILY    Dispense:  90 tablet    Refill:  3  . citalopram (CELEXA) 40 MG tablet    Sig: Take 1 tablet (40 mg total) by mouth daily.  Dispense:  90 tablet    Refill:  1    Return in about 6 months (around 10/31/2018) for follow-up chronic medical conditions STALL[place].   Kristi Elayne Guerin, M.D. Primary Care at [facility] previously [facility] [address] [place], [place]  [phone] phone [phone] fax

## 2018-04-30 NOTE — Patient Instructions (Addendum)
Dr. Delia Chimes Dr. Grant Fontana  [facility] in [place], [place] as of August 2019.  IF you received an x-ray today, you will receive an invoice from [facility]. Please contact [facility] at [phone] with questions or concerns regarding your invoice.   IF you received labwork today, you will receive an invoice from [facility]. Please contact [facility] at [phone] with questions or concerns regarding your invoice.   Our billing staff will not be able to assist you with questions regarding bills from these companies.  You will be contacted with the lab results as soon as they are available. The fastest way to get your results is to activate your My Chart account. Instructions are located on the last page of this paperwork. If you have not heard from [name] regarding the results in 2 weeks, please contact this office.      Preventive Care 40-64 Years, Female Preventive care refers to lifestyle choices and visits with your health care provider that can promote health and wellness. What does preventive care include?  A yearly physical exam. This is also called an annual well check.  Dental exams once or twice a year.  Routine eye exams. Ask your health care provider how often you should have your eyes checked.  Personal lifestyle choices, including: ? Daily care of your teeth and gums. ? Regular physical activity. ? Eating a healthy diet. ? Avoiding tobacco and drug use. ? Limiting alcohol use. ? Practicing safe sex. ? Taking low-dose aspirin daily starting at age 35. ? Taking vitamin and mineral supplements as recommended by your health care provider. What happens during an annual well check? The services and screenings done by your health care provider during your annual well check will depend on your age, overall health, lifestyle risk factors, and family history of disease. Counseling Your health care provider may ask you questions about  your:  Alcohol use.  Tobacco use.  Drug use.  Emotional well-being.  Home and relationship well-being.  Sexual activity.  Eating habits.  Work and work Statistician.  Method of birth control.  Menstrual cycle.  Pregnancy history.  Screening You may have the following tests or measurements:  Height, weight, and BMI.  Blood pressure.  Lipid and cholesterol levels. These may be checked every 5 years, or more frequently if you are over 20 years old.  Skin check.  Lung cancer screening. You may have this screening every year starting at age 58 if you have a 30-pack-year history of smoking and currently smoke or have quit within the past 15 years.  Fecal occult blood test (FOBT) of the stool. You may have this test every year starting at age 69.  Flexible sigmoidoscopy or colonoscopy. You may have a sigmoidoscopy every 5 years or a colonoscopy every 10 years starting at age 19.  Hepatitis C blood test.  Hepatitis B blood test.  Sexually transmitted disease (STD) testing.  Diabetes screening. This is done by checking your blood sugar (glucose) after you have not eaten for a while (fasting). You may have this done every 1-3 years.  Mammogram. This may be done every 1-2 years. Talk to your health care provider about when you should start having regular mammograms. This may depend on whether you have a family history of breast cancer.  BRCA-related cancer screening. This may be done if you have a family history of breast, ovarian, tubal, or peritoneal cancers.  Pelvic exam and Pap test. This may be done every 3 years  starting at age 66. Starting at age 75, this may be done every 5 years if you have a Pap test in combination with an HPV test.  Bone density scan. This is done to screen for osteoporosis. You may have this scan if you are at high risk for osteoporosis.  Discuss your test results, treatment options, and if necessary, the need for more tests with your health  care provider. Vaccines Your health care provider may recommend certain vaccines, such as:  Influenza vaccine. This is recommended every year.  Tetanus, diphtheria, and acellular pertussis (Tdap, Td) vaccine. You may need a Td booster every 10 years.  Varicella vaccine. You may need this if you have not been vaccinated.  Zoster vaccine. You may need this after age 80.  Measles, mumps, and rubella (MMR) vaccine. You may need at least one dose of MMR if you were born in 1957 or later. You may also need a second dose.  Pneumococcal 13-valent conjugate (PCV13) vaccine. You may need this if you have certain conditions and were not previously vaccinated.  Pneumococcal polysaccharide (PPSV23) vaccine. You may need one or two doses if you smoke cigarettes or if you have certain conditions.  Meningococcal vaccine. You may need this if you have certain conditions.  Hepatitis A vaccine. You may need this if you have certain conditions or if you travel or work in places where you may be exposed to hepatitis A.  Hepatitis B vaccine. You may need this if you have certain conditions or if you travel or work in places where you may be exposed to hepatitis B.  Haemophilus influenzae type b (Hib) vaccine. You may need this if you have certain conditions.  Talk to your health care provider about which screenings and vaccines you need and how often you need them. This information is not intended to replace advice given to you by your health care provider. Make sure you discuss any questions you have with your health care provider. Document Released: 01/06/2016 Document Revised: 08/29/2016 Document Reviewed: 10/11/2015 Elsevier Interactive Patient Education  [employer].

## 2018-05-01 LAB — MICROALBUMIN / CREATININE URINE RATIO
CREATININE, UR: 24.7 mg/dL
Microalb/Creat Ratio: 52.2 mg/g creat — ABNORMAL HIGH (ref 0.0–30.0)
Microalbumin, Urine: 12.9 ug/mL

## 2018-05-01 LAB — CBC WITH DIFFERENTIAL/PLATELET
BASOS ABS: 0 10*3/uL (ref 0.0–0.2)
Basos: 0 %
EOS (ABSOLUTE): 0.2 10*3/uL (ref 0.0–0.4)
EOS: 2 %
HEMOGLOBIN: 15.7 g/dL (ref 11.1–15.9)
Hematocrit: 47.4 % — ABNORMAL HIGH (ref 34.0–46.6)
IMMATURE GRANULOCYTES: 0 %
Immature Grans (Abs): 0 10*3/uL (ref 0.0–0.1)
Lymphocytes Absolute: 3.4 10*3/uL — ABNORMAL HIGH (ref 0.7–3.1)
Lymphs: 33 %
MCH: 30.5 pg (ref 26.6–33.0)
MCHC: 33.1 g/dL (ref 31.5–35.7)
MCV: 92 fL (ref 79–97)
MONOCYTES: 10 %
Monocytes Absolute: 1 10*3/uL — ABNORMAL HIGH (ref 0.1–0.9)
NEUTROS PCT: 55 %
Neutrophils Absolute: 5.6 10*3/uL (ref 1.4–7.0)
Platelets: 291 10*3/uL (ref 150–379)
RBC: 5.14 x10E6/uL (ref 3.77–5.28)
RDW: 14 % (ref 12.3–15.4)
WBC: 10.1 10*3/uL (ref 3.4–10.8)

## 2018-05-01 LAB — COMPREHENSIVE METABOLIC PANEL
ALBUMIN: 4.6 g/dL (ref 3.6–4.8)
ALK PHOS: 110 IU/L (ref 39–117)
ALT: 26 IU/L (ref 0–32)
AST: 19 IU/L (ref 0–40)
Albumin/Globulin Ratio: 2 (ref 1.2–2.2)
BUN/Creatinine Ratio: 13 (ref 12–28)
BUN: 12 mg/dL (ref 8–27)
Bilirubin Total: 0.5 mg/dL (ref 0.0–1.2)
CALCIUM: 10.4 mg/dL — AB (ref 8.7–10.3)
CO2: 27 mmol/L (ref 20–29)
CREATININE: 0.94 mg/dL (ref 0.57–1.00)
Chloride: 95 mmol/L — ABNORMAL LOW (ref 96–106)
GFR calc Af Amer: 76 mL/min/{1.73_m2} (ref >59)
GFR, EST NON AFRICAN AMERICAN: 66 mL/min/{1.73_m2} (ref >59)
GLUCOSE: 119 mg/dL — AB (ref 65–99)
Globulin, Total: 2.3 g/dL (ref 1.5–4.5)
Potassium: 4.3 mmol/L (ref 3.5–5.2)
SODIUM: 136 mmol/L (ref 134–144)
Total Protein: 6.9 g/dL (ref 6.0–8.5)

## 2018-05-01 LAB — TSH: TSH: 1.26 u[IU]/mL (ref 0.450–4.500)

## 2018-05-01 LAB — LIPID PANEL
CHOL/HDL RATIO: 2.9 ratio (ref 0.0–4.4)
CHOLESTEROL TOTAL: 139 mg/dL (ref 100–199)
HDL: 48 mg/dL (ref >39)
LDL CALC: 68 mg/dL (ref 0–99)
Triglycerides: 113 mg/dL (ref 0–149)
VLDL Cholesterol Cal: 23 mg/dL (ref 5–40)

## 2018-05-01 LAB — HEMOGLOBIN A1C
ESTIMATED AVERAGE GLUCOSE: 140 mg/dL
HEMOGLOBIN A1C: 6.5 % — AB (ref 4.8–5.6)

## 2018-05-02 ENCOUNTER — Ambulatory Visit (INDEPENDENT_AMBULATORY_CARE_PROVIDER_SITE_OTHER): Payer: BLUE CROSS/BLUE SHIELD | Admitting: Psychology

## 2018-05-02 DIAGNOSIS — F4323 Adjustment disorder with mixed anxiety and depressed mood: Secondary | ICD-10-CM

## 2018-05-18 ENCOUNTER — Other Ambulatory Visit: Payer: Self-pay | Admitting: Family Medicine

## 2018-05-19 ENCOUNTER — Encounter: Payer: Self-pay | Admitting: Family Medicine

## 2018-05-26 ENCOUNTER — Other Ambulatory Visit: Payer: Self-pay | Admitting: Family Medicine

## 2018-05-26 DIAGNOSIS — L669 Cicatricial alopecia, unspecified: Secondary | ICD-10-CM | POA: Diagnosis not present

## 2018-05-26 DIAGNOSIS — L658 Other specified nonscarring hair loss: Secondary | ICD-10-CM | POA: Diagnosis not present

## 2018-06-20 DIAGNOSIS — H401121 Primary open-angle glaucoma, left eye, mild stage: Secondary | ICD-10-CM | POA: Diagnosis not present

## 2018-06-25 ENCOUNTER — Ambulatory Visit: Payer: BLUE CROSS/BLUE SHIELD | Admitting: Psychology

## 2018-06-25 DIAGNOSIS — F4323 Adjustment disorder with mixed anxiety and depressed mood: Secondary | ICD-10-CM | POA: Diagnosis not present

## 2018-07-04 ENCOUNTER — Ambulatory Visit: Payer: BLUE CROSS/BLUE SHIELD | Admitting: Psychology

## 2018-07-27 ENCOUNTER — Encounter: Payer: Self-pay | Admitting: Family Medicine

## 2018-08-14 ENCOUNTER — Ambulatory Visit: Payer: BLUE CROSS/BLUE SHIELD | Admitting: Psychology

## 2018-08-14 DIAGNOSIS — F4323 Adjustment disorder with mixed anxiety and depressed mood: Secondary | ICD-10-CM

## 2018-09-29 ENCOUNTER — Ambulatory Visit: Payer: BLUE CROSS/BLUE SHIELD | Admitting: Psychology

## 2018-09-29 DIAGNOSIS — F4323 Adjustment disorder with mixed anxiety and depressed mood: Secondary | ICD-10-CM

## 2018-10-20 ENCOUNTER — Other Ambulatory Visit: Payer: Self-pay | Admitting: Family Medicine

## 2018-10-24 ENCOUNTER — Other Ambulatory Visit: Payer: Self-pay | Admitting: Family Medicine

## 2018-10-24 DIAGNOSIS — Z1231 Encounter for screening mammogram for malignant neoplasm of breast: Secondary | ICD-10-CM

## 2018-10-27 ENCOUNTER — Ambulatory Visit: Payer: BLUE CROSS/BLUE SHIELD | Admitting: Psychology

## 2018-10-27 ENCOUNTER — Ambulatory Visit
Admission: RE | Admit: 2018-10-27 | Discharge: 2018-10-27 | Disposition: A | Discharge status: Home / Self Care | Payer: BLUE CROSS/BLUE SHIELD | Source: Ambulatory Visit | Attending: Nurse Practitioner | Admitting: Nurse Practitioner

## 2018-10-27 DIAGNOSIS — Z1231 Encounter for screening mammogram for malignant neoplasm of breast: Secondary | ICD-10-CM | POA: Diagnosis not present

## 2018-11-10 ENCOUNTER — Encounter: Payer: Self-pay | Admitting: Family Medicine

## 2018-11-10 ENCOUNTER — Other Ambulatory Visit: Payer: Self-pay

## 2018-11-10 ENCOUNTER — Ambulatory Visit: Payer: BLUE CROSS/BLUE SHIELD | Admitting: Family Medicine

## 2018-11-10 VITALS — BP 136/84 | HR 85 | Temp 98.9°F | Resp 17 | Ht 65.55 in | Wt 218.6 lb

## 2018-11-10 DIAGNOSIS — F419 Anxiety disorder, unspecified: Secondary | ICD-10-CM | POA: Diagnosis not present

## 2018-11-10 DIAGNOSIS — Z87891 Personal history of nicotine dependence: Secondary | ICD-10-CM | POA: Insufficient documentation

## 2018-11-10 DIAGNOSIS — E119 Type 2 diabetes mellitus without complications: Secondary | ICD-10-CM | POA: Diagnosis not present

## 2018-11-10 DIAGNOSIS — Z23 Encounter for immunization: Secondary | ICD-10-CM | POA: Diagnosis not present

## 2018-11-10 DIAGNOSIS — I10 Essential (primary) hypertension: Secondary | ICD-10-CM

## 2018-11-10 DIAGNOSIS — F329 Major depressive disorder, single episode, unspecified: Secondary | ICD-10-CM

## 2018-11-10 DIAGNOSIS — F32A Depression, unspecified: Secondary | ICD-10-CM

## 2018-11-10 LAB — POCT GLYCOSYLATED HEMOGLOBIN (HGB A1C): HEMOGLOBIN A1C: 6.6 % — AB (ref 4.0–5.6)

## 2018-11-10 MED ORDER — CITALOPRAM HYDROBROMIDE 40 MG PO TABS: ORAL_TABLET | ORAL | 3 refills | Status: DC

## 2018-11-10 MED ORDER — ALPRAZOLAM 0.5 MG PO TABS: ORAL_TABLET | ORAL | 2 refills | Status: DC

## 2018-11-10 MED ORDER — LISINOPRIL-HYDROCHLOROTHIAZIDE 20-12.5 MG PO TABS: 2.0000 | ORAL_TABLET | Freq: Every day | ORAL | 3 refills | Status: DC

## 2018-11-10 MED ORDER — BUSPIRONE HCL 5 MG PO TABS: 5.0000 mg | ORAL_TABLET | Freq: Three times a day (TID) | ORAL | 0 refills | Status: DC

## 2018-11-10 NOTE — Progress Notes (Signed)
Chief Complaint  Patient presents with  . Medical Management of Chronic Issues    6 months f/u  . Medication Refill    xanax, lisinopril-hctz, celexa    HPI  Anxiety and Depression Benzodiazapene dependence She is taking celexa and wellbutrin for anxiety  She reports that she takes alprazolam prn and takes it as needed She does not take alprazolam for sleep She has been using alprazolam for around 10 years now She reports that no one has told her that benzos can cause dependence and withdrawal.  She reports that 30 pills last about 2 months Her anxiety triggers include bridges, people and stress She goes to take to someone for counseling for management of her depression  She reports that her depression is well controlled with her medications.  Depression screen [facility] 2/9 11/10/2018 04/30/2018 04/30/2018 09/11/2017 03/12/2017  Decreased Interest 0 0 0 0 0  Down, Depressed, Hopeless 0 0 0 0 0  PHQ - 2 Score 0 0 0 0 0  Altered sleeping - - - - -  Tired, decreased energy - - - - -  Change in appetite - - - - -  Feeling bad or failure about yourself  - - - - -  Trouble concentrating - - - - -  Moving slowly or fidgety/restless - - - - -  Suicidal thoughts - - - - -  PHQ-9 Score - - - - -  Difficult doing work/chores - - - - -    Diabetes Mellitus Diabetes Mellitus: Patient presents for follow up of diabetes. Symptoms: none. Symptoms have stabilized. Patient denies foot ulcerations, hyperglycemia, hypoglycemia , increase appetite, nausea, paresthesia of the feet, polydipsia and polyuria.  Evaluation to date has been included: hemoglobin A1C.  Home sugars: BGs are running  consistent with Hgb A1C. Treatment to date: Continued metformin which has been effective.  Lab Results  Component Value Date   HGBA1C 6.5 (H) 04/30/2018   She takes metformin without side effects.   Hypertension: Patient here for follow-up of elevated blood pressure. She is not exercising and is adherent to low salt  diet.  Blood pressure is well controlled at home. Cardiac symptoms none. Patient denies chest pain, chest pressure/discomfort, claudication, exertional chest pressure/discomfort and fatigue.  Cardiovascular risk factors: diabetes mellitus, dyslipidemia and hypertension. Use of agents associated with hypertension: none. History of target organ damage: none. BP Readings from Last 3 Encounters:  11/10/18 136/84  04/30/18 118/82  09/11/17 124/85     Past Medical History:  Diagnosis Date  . Allergy   . Anxiety   . Depression   . Diabetes mellitus without complication ([place])   . Glaucoma   . Hypertension     Current Outpatient Medications  Medication Sig Dispense Refill  . ACIPHEX 20 MG tablet Take 2 tablets (40 mg total) by mouth daily. 180 tablet 1  . ALPRAZolam (XANAX) 0.5 MG tablet TAKE 1 TABLET BY MOUTH EVERY DAY AS NEEDED FOR ANXIETY 30 tablet 2  . aspirin 81 MG tablet Take 81 mg by mouth daily.    Marland Kitchen azelastine (ASTELIN) 0.1 % nasal spray Place 2 sprays into both nostrils 2 (two) times daily. Use in each nostril as directed 30 mL 12  . Blood Glucose Monitoring Suppl (CONTOUR BLOOD GLUCOSE SYSTEM) DEVI Use to check blood sugar 3 times a week. Dx code: E11.9. 1 Device 0  . buPROPion (WELLBUTRIN XL) 150 MG 24 hr tablet TAKE 1 TABLET(150 MG) BY MOUTH DAILY 90 tablet 1  .  citalopram (CELEXA) 40 MG tablet TAKE 1 TABLET(40 MG) BY MOUTH DAILY 90 tablet 3  . fluticasone (FLONASE) 50 MCG/ACT nasal spray SHAKE LIQUID AND USE 2 SPRAYS IN EACH NOSTRIL DAILY 48 g 3  . glucose blood (ONE TOUCH ULTRA TEST) test strip Check blood sugar once daily 100 each 3  . hydrocortisone (ANUCORT-HC) 25 MG suppository Place 1 suppository (25 mg total) rectally 2 (two) times daily. 24 suppository 3  . Lancets (ONETOUCH ULTRASOFT) lancets Check sugar once daily 100 each 3  . lisinopril-hydrochlorothiazide (PRINZIDE,ZESTORETIC) 20-12.5 MG tablet Take 2 tablets by mouth daily. 180 tablet 3  . metFORMIN (GLUCOPHAGE) 500  MG tablet TAKE 1 TABLET(500 MG) BY MOUTH TWICE DAILY WITH A MEAL 180 tablet 3  . minoxidil (LONITEN) 2.5 MG tablet Take 5 mg by mouth daily.    . rosuvastatin (CRESTOR) 10 MG tablet TAKE 1 TABLET(10 MG) BY MOUTH DAILY 90 tablet 3  . busPIRone (BUSPAR) 5 MG tablet Take 1 tablet (5 mg total) by mouth 3 (three) times daily. 30 tablet 0   No current facility-administered medications for this visit.     Allergies:  Allergies  Allergen Reactions  . Biaxin [Clarithromycin] Rash    Past Surgical History:  Procedure Laterality Date  . ABDOMINAL HYSTERECTOMY     Fibroids/DUB.  Ovaries intact.    Social History   Socioeconomic History  . Marital status: Divorced    Spouse name: Not on file  . Number of children: 0  . Years of education: Not on file  . Highest education level: Not on file  Occupational History  . Occupation: Camera operator  . Occupation: DIRECTOR OF Scientist, clinical (histocompatibility and immunogenetics): [employer]  Social Needs  . Financial resource strain: Not on file  . Food insecurity:    Worry: Not on file    Inability: Not on file  . Transportation needs:    Medical: Not on file    Non-medical: Not on file  Tobacco Use  . Smoking status: Former Smoker    Packs/day: 0.25    Types: Cigarettes    Last attempt to quit: 12/24/2010    Years since quitting: 7.8  . Smokeless tobacco: Never Used  Substance and Sexual Activity  . Alcohol use: Yes    Alcohol/week: 3.0 standard drinks    Types: 3 Glasses of wine per week    Comment: wine  . Drug use: No  . Sexual activity: Not Currently    Birth control/protection: Post-menopausal, Surgical  Lifestyle  . Physical activity:    Days per week: Not on file    Minutes per session: Not on file  . Stress: Not on file  Relationships  . Social connections:    Talks on phone: Not on file    Gets together: Not on file    Attends religious service: Not on file    Active member of club or organization: Not on file     Attends meetings of clubs or organizations: Not on file    Relationship status: Not on file  Other Topics Concern  . 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      Tobacco: none; quit age 61.  Alcohol:  Socially; 3-4 times per week; wine one glass each episode      Exercise:  Sporadic.  Walking some. No yoga in 2019.  Job is very active.      Seatbelt: 100%; never texting while driving    Family History  Problem Relation Age of Onset  . Hypertension Mother   . Cancer Mother        lung cancer  . Multiple sclerosis Sister   . Diabetes Maternal Grandmother   . Hypertension Maternal Grandmother   . Arthritis Sister   . Mental retardation Sister   . Breast cancer Cousin   . Breast cancer Cousin      ROS Review of Systems See HPI Constitution: No fevers or chills No malaise No diaphoresis Skin: No rash or itching Eyes: no blurry vision, no double vision GU: no dysuria or hematuria Neuro: no dizziness or headaches all others reviewed and negative   Objective: Vitals:   11/10/18 0928 11/10/18 0933  BP: (!) 146/90 136/84  Pulse: 85   Resp: 17   Temp: 98.9 F (37.2 C)   TempSrc: Oral   SpO2: 94%   Weight: 218 lb 9.6 oz (99.2 kg)   Height: 5' 5.55" (1.665 m)     Physical Exam Physical Exam  Constitutional: He is oriented to person, place, and time. He appears well-developed and well-nourished.  HENT:  Head: Normocephalic and atraumatic.  Eyes: Conjunctivae and EOM are normal.  Neck: supple, no thyromegaly Cardiovascular: Normal rate, regular rhythm, normal heart sounds and intact distal pulses.  No murmur heard. Pulmonary/Chest: Effort normal and breath sounds normal. No stridor. No respiratory distress. He has no wheezes.  Abdomen: nondistended, normoactive  bs, soft, nontender Neurological: He is alert and oriented to person, place, and time.  Skin: Skin is warm. Capillary refill takes less than 2 seconds.  Psychiatric: He has a normal mood and affect. His behavior is normal. Judgment and thought content normal.   Assessment and Plan Jesus was seen today for medical management of chronic issues and medication refill.  Diagnoses and all orders for this visit:  Flu vaccine need -     Flu Vaccine QUAD 36+ mos IM  Former smoker  Essential hypertension, benign- bp doing well on dual therapy Continue current meds  DASH diet emphasized -     Comprehensive metabolic panel -     lisinopril-hydrochlorothiazide (PRINZIDE,ZESTORETIC) 20-12.5 MG tablet; Take 2 tablets by mouth daily. -     Microalbumin, urine  Anxiety and depression- discussed tapering down the xanax and increasing the buspirone  Discussed that buspirone will not be as quick to onset as the xanax Continue wellbutrin and celexa -     citalopram (CELEXA) 40 MG tablet; TAKE 1 TABLET(40 MG) BY MOUTH DAILY -     ALPRAZolam (XANAX) 0.5 MG tablet; TAKE 1 TABLET BY MOUTH EVERY DAY AS NEEDED FOR ANXIETY -     busPIRone (BUSPAR) 5 MG tablet; Take 1 tablet (5 mg total) by mouth 3 (three) times daily.  Type 2 diabetes mellitus without complication, without long-term current use of insulin ([place])- discussed that her diabetes is doing well Continue metformin -     POCT glycosylated hemoglobin (Hb A1C) -     Comprehensive metabolic panel -     Microalbumin, urine  Hypercalcemia- if her hypercalcemia worsens may need to change the HCTZ  -     Comprehensive metabolic panel     Cheryl Morton A Nolon Rod

## 2018-11-10 NOTE — Patient Instructions (Addendum)
1.  Use buspirone 5mg  as needed for anxiety and panic attacks If the symptoms are not improved then you may take a xanax The goal is to use less and less of the xanax to wean off  2. Follow up in 2 months for anxiety  Buspirone comes in 5mg , 10mg  and 15mg  doses and may need titration  3. Do not stop the xanax cold [place]    If you have lab work done today you will be contacted with your lab results within the next 2 weeks.  If you have not heard from [name] then please contact us. The fastest way to get your results is to register for My Chart.   IF you received an x-ray today, you will receive an invoice from [facility]. Please contact [facility]iology at [phone] with questions or concerns regarding your invoice.   IF you received labwork today, you will receive an invoice from [facility]. Please contact [facility] at [phone] with questions or concerns regarding your invoice.   Our billing staff will not be able to assist you with questions regarding bills from these companies.  You will be contacted with the lab results as soon as they are available. The fastest way to get your results is to activate your My Chart account. Instructions are located on the last page of this paperwork. If you have not heard from [name] regarding the results in 2 weeks, please contact this office.     Benzodiazepine Withdrawal Benzodiazepines are prescription medicines that decrease the activity of (depress) the central nervous system and cause changes in certain brain chemicals (neurotransmitters). Withdrawal is a group of physical and mental symptoms that can happen when you suddenly stop taking a medicine. There are many types of benzodiazepines. Some benzodiazepines take effect quickly and stay in your system for a short amount of time (short-acting). Other benzodiazepines require more time to take effect and stay in your system for longer amounts of time (long-acting). The five most commonly  prescribed benzodiazepines are:  Alprazolam.  Lorazepam.  Clonazepam.  Diazepam.  Temazepam.  What are the causes? When you take benzodiazepines, your brain needs more and more of the medicine over time in order to get the same effects from it. This increased need is called tolerance. As you develop a tolerance, your brain adapts to the effects of the benzodiazepine and relies on these effects. This is called dependency. Withdrawal happens when you suddenly stop taking your medicine. This does not give your brain enough time to adapt to not having the medicine. What increases the risk? This condition is more likely to develop in:  People who have taken benzodiazepines for more than 1-2 weeks.  People who have developed a tolerance for benzodiazepines.  People who have developed a dependence on benzodiazepines.  People who take high dosages of benzodiazepines.  People who take doses of benzodiazepines that are higher than prescribed.  People who take benzodiazepines without a prescription.  People who use benzodiazepines with other substances that depress the central nervous system, such as alcohol.  People who have a history of drug or alcohol abuse.  What are the signs or symptoms? Symptoms of this condition may include:  Difficulty sleeping.  Anxiety.  Restlessness.  Irritability.  Muscle aches.  Involuntary shaking or trembling of a body part (tremor).  Confusion and poor concentration.  Vomiting.  Sweating.  Headaches.  Feeling or seeing things that are not there (hallucinations).  Seizures.  Symptoms of withdrawal from short-acting benzodiazepines may develop  1-2 days after you stop taking your medicine, and they may last for 2-4 weeks or longer. Symptoms of withdrawal from long-acting benzodiazepines may develop 2-7 days after you stop taking your medicine, and they may last for 2-8 weeks or longer. How is this diagnosed? This condition may be  diagnosed based on:  Your symptoms.  A physical exam. Your health care provider may check for: ? Rapid heartbeat. ? Rapid breathing. ? Tremors. ? High blood pressure.  Blood tests.  Urine tests.  Your alcohol and drug habits.  Your medical history.  How is this treated? Treatment for this condition depends on:  Your symptoms.  The type of benzodiazepine you have been taking.  How long you have been taking benzodiazepines.  Treatment usually involves starting you on a safe and stable dose of a benzodiazepine and then slowly lowering your dosage over time (tapered withdrawal). This may be done at a hospital or a treatment center. Long-term treatment for this condition may involve medicine, counseling, and support groups. Follow these instructions at home:  Take over-the-counter and prescription medicines only as told by your health care provider.  Check with your health care provider before starting new medicines.  Keep all follow-up visits as told by your health care provider. This is important. How is this prevented?  Do not take any benzodiazepines without a prescription.  Do not take more than your prescribed dosage.  Do not mix benzodiazepines with alcohol or other medicines.  Do not stop taking benzodiazepines without speaking with your health care provider. Contact a health care provider if:  You are not able to take your medicines as told by your health care provider.  You have symptoms that get worse.  You develop withdrawal symptoms during your tapered withdrawal.  You develop a craving for drugs or alcohol.  You experience withdrawal again (relapse). Get help right away if:  You have a seizure.  You become very confused.  You lose consciousness.  You have difficulty breathing.  You have serious thoughts about hurting yourself or someone else. This information is not intended to replace advice given to you by your health care provider. Make  sure you discuss any questions you have with your health care provider. Document Released: 11/29/2011 Document Revised: 04/19/2016 Document Reviewed: 06/01/2015 Elsevier Interactive Patient Education  [employer].

## 2018-11-11 LAB — COMPREHENSIVE METABOLIC PANEL
A/G RATIO: 2.2 (ref 1.2–2.2)
ALK PHOS: 114 IU/L (ref 39–117)
ALT: 30 IU/L (ref 0–32)
AST: 25 IU/L (ref 0–40)
Albumin: 4.8 g/dL (ref 3.6–4.8)
BUN / CREAT RATIO: 15 (ref 12–28)
BUN: 12 mg/dL (ref 8–27)
Bilirubin Total: 0.5 mg/dL (ref 0.0–1.2)
CHLORIDE: 95 mmol/L — AB (ref 96–106)
CO2: 25 mmol/L (ref 20–29)
CREATININE: 0.79 mg/dL (ref 0.57–1.00)
Calcium: 10 mg/dL (ref 8.7–10.3)
GFR calc Af Amer: 93 mL/min/{1.73_m2} (ref >59)
GFR calc non Af Amer: 81 mL/min/{1.73_m2} (ref >59)
GLOBULIN, TOTAL: 2.2 g/dL (ref 1.5–4.5)
Glucose: 116 mg/dL — ABNORMAL HIGH (ref 65–99)
POTASSIUM: 4 mmol/L (ref 3.5–5.2)
SODIUM: 137 mmol/L (ref 134–144)
Total Protein: 7 g/dL (ref 6.0–8.5)

## 2018-11-11 LAB — MICROALBUMIN, URINE: MICROALBUM., U, RANDOM: 30 ug/mL

## 2018-11-13 ENCOUNTER — Encounter: Payer: Self-pay | Admitting: Family Medicine

## 2018-11-28 ENCOUNTER — Ambulatory Visit: Payer: BLUE CROSS/BLUE SHIELD | Admitting: Psychology

## 2018-11-28 DIAGNOSIS — F4323 Adjustment disorder with mixed anxiety and depressed mood: Secondary | ICD-10-CM

## 2018-12-01 DIAGNOSIS — L658 Other specified nonscarring hair loss: Secondary | ICD-10-CM | POA: Diagnosis not present

## 2018-12-01 DIAGNOSIS — L669 Cicatricial alopecia, unspecified: Secondary | ICD-10-CM | POA: Diagnosis not present

## 2018-12-19 DIAGNOSIS — H401121 Primary open-angle glaucoma, left eye, mild stage: Secondary | ICD-10-CM | POA: Diagnosis not present

## 2018-12-19 LAB — HM DIABETES EYE EXAM

## 2019-01-06 LAB — HM DIABETES EYE EXAM

## 2019-01-12 ENCOUNTER — Other Ambulatory Visit: Payer: Self-pay

## 2019-01-12 ENCOUNTER — Ambulatory Visit (INDEPENDENT_AMBULATORY_CARE_PROVIDER_SITE_OTHER): Payer: BLUE CROSS/BLUE SHIELD | Admitting: Family Medicine

## 2019-01-12 ENCOUNTER — Encounter: Payer: Self-pay | Admitting: Family Medicine

## 2019-01-12 VITALS — BP 144/77 | HR 80 | Temp 98.9°F | Resp 18 | Ht 65.55 in | Wt 222.2 lb

## 2019-01-12 DIAGNOSIS — R0981 Nasal congestion: Secondary | ICD-10-CM | POA: Diagnosis not present

## 2019-01-12 DIAGNOSIS — F329 Major depressive disorder, single episode, unspecified: Secondary | ICD-10-CM

## 2019-01-12 DIAGNOSIS — E119 Type 2 diabetes mellitus without complications: Secondary | ICD-10-CM

## 2019-01-12 DIAGNOSIS — F32A Depression, unspecified: Secondary | ICD-10-CM

## 2019-01-12 DIAGNOSIS — F419 Anxiety disorder, unspecified: Secondary | ICD-10-CM | POA: Diagnosis not present

## 2019-01-12 DIAGNOSIS — H6993 Unspecified Eustachian tube disorder, bilateral: Secondary | ICD-10-CM | POA: Diagnosis not present

## 2019-01-12 MED ORDER — ONETOUCH ULTRASOFT LANCETS MISC: 3 refills | Status: AC

## 2019-01-12 MED ORDER — GLUCOSE BLOOD VI STRP: ORAL_STRIP | 3 refills | Status: AC

## 2019-01-12 MED ORDER — PREDNISONE 10 MG PO TABS: 40.0000 mg | ORAL_TABLET | Freq: Every day | ORAL | 0 refills | Status: AC

## 2019-01-12 MED ORDER — BUSPIRONE HCL 5 MG PO TABS: 5.0000 mg | ORAL_TABLET | Freq: Three times a day (TID) | ORAL | 3 refills | Status: DC

## 2019-01-12 NOTE — Patient Instructions (Addendum)
   If you have lab work done today you will be contacted with your lab results within the next 2 weeks.  If you have not heard from us then please contact us. The fastest way to get your results is to register for My Chart.   IF you received an x-ray today, you will receive an invoice from [facility]. Please contact  Radiology at [phone] with questions or concerns regarding your invoice.   IF you received labwork today, you will receive an invoice from [facility]. Please contact [facility] at [phone] with questions or concerns regarding your invoice.   Our billing staff will not be able to assist you with questions regarding bills from these companies.  You will be contacted with the lab results as soon as they are available. The fastest way to get your results is to activate your My Chart account. Instructions are located on the last page of this paperwork. If you have not heard from us regarding the results in 2 weeks, please contact this office.     Eustachian Tube Dysfunction  Eustachian tube dysfunction refers to a condition in which a blockage develops in the narrow passage that connects the middle ear to the back of the nose (eustachian tube). The eustachian tube regulates air pressure in the middle ear by letting air move between the ear and nose. It also helps to drain fluid from the middle ear space. Eustachian tube dysfunction can affect one or both ears. When the eustachian tube does not function properly, air pressure, fluid, or both can build up in the middle ear. What are the causes? This condition occurs when the eustachian tube becomes blocked or cannot open normally. Common causes of this condition include:  Ear infections.  Colds and other infections that affect the nose, mouth, and throat (upper respiratory tract).  Allergies.  Irritation from cigarette smoke.  Irritation from stomach acid coming up into the esophagus (gastroesophageal  reflux). The esophagus is the tube that carries food from the mouth to the stomach.  Sudden changes in air pressure, such as from descending in an airplane or scuba diving.  Abnormal growths in the nose or throat, such as: ? Growths that line the nose (nasal polyps). ? Abnormal growth of cells (tumors). ? Enlarged tissue at the back of the throat (adenoids). What increases the risk? You are more likely to develop this condition if:  You smoke.  You are overweight.  You are a child who has: ? Certain birth defects of the mouth, such as cleft palate. ? Large tonsils or adenoids. What are the signs or symptoms? Common symptoms of this condition include:  A feeling of fullness in the ear.  Ear pain.  Clicking or popping noises in the ear.  Ringing in the ear.  Hearing loss.  Loss of balance.  Dizziness. Symptoms may get worse when the air pressure around you changes, such as when you travel to an area of high elevation, fly on an airplane, or go scuba diving. How is this diagnosed? This condition may be diagnosed based on:  Your symptoms.  A physical exam of your ears, nose, and throat.  Tests, such as those that measure: ? The movement of your eardrum (tympanogram). ? Your hearing (audiometry). How is this treated? Treatment depends on the cause and severity of your condition.  In mild cases, you may relieve your symptoms by moving air into your ears. This is called "popping the ears."  In more severe cases, or   if you have symptoms of fluid in your ears, treatment may include: ? Medicines to relieve congestion (decongestants). ? Medicines that treat allergies (antihistamines). ? Nasal sprays or ear drops that contain medicines that reduce swelling (steroids). ? A procedure to drain the fluid in your eardrum (myringotomy). In this procedure, a small tube is placed in the eardrum to:  Drain the fluid.  Restore the air in the middle ear space. ? A procedure to  insert a balloon device through the nose to inflate the opening of the eustachian tube (balloon dilation). Follow these instructions at home: Lifestyle  Do not do any of the following until your health care provider approves: ? Travel to high altitudes. ? Fly in airplanes. ? Work in a Pension scheme manager or room. ? Scuba dive.  Do not use any products that contain nicotine or tobacco, such as cigarettes and e-cigarettes. If you need help quitting, ask your health care provider.  Keep your ears dry. Wear fitted earplugs during showering and bathing. Dry your ears completely after. General instructions  Take over-the-counter and prescription medicines only as told by your health care provider.  Use techniques to help pop your ears as recommended by your health care provider. These may include: ? Chewing gum. ? Yawning. ? Frequent, forceful swallowing. ? Closing your mouth, holding your nose closed, and gently blowing as if you are trying to blow air out of your nose.  Keep all follow-up visits as told by your health care provider. This is important. Contact a health care provider if:  Your symptoms do not go away after treatment.  Your symptoms come back after treatment.  You are unable to pop your ears.  You have: ? A fever. ? Pain in your ear. ? Pain in your head or neck. ? Fluid draining from your ear.  Your hearing suddenly changes.  You become very dizzy.  You lose your balance. Summary  Eustachian tube dysfunction refers to a condition in which a blockage develops in the eustachian tube.  It can be caused by ear infections, allergies, inhaled irritants, or abnormal growths in the nose or throat.  Symptoms include ear pain, hearing loss, or ringing in the ears.  Mild cases are treated with maneuvers to unblock the ears, such as yawning or ear popping.  Severe cases are treated with medicines. Surgery may also be done (rare). This information is not intended to  replace advice given to you by your health care provider. Make sure you discuss any questions you have with your health care provider. Document Released: 01/06/2016 Document Revised: 04/01/2018 Document Reviewed: 04/01/2018 Elsevier Interactive Patient Education  2019 [employer].

## 2019-01-12 NOTE — Progress Notes (Signed)
Established Patient Office Visit  Subjective:  Patient ID: Cheryl Morton, female    DOB: 1956/10/12  Age: 63 y.o. MRN: [id number]  CC:  Chief Complaint  Patient presents with  . Anxiety    follow up   . Medication Refill    lancets and strips     HPI Cheryl Morton presents for   URI She is taking otc cough meds She states that she has postnasal drip, some sinus pressure, sore throat She states that she flew twice and had some increased fluid in her ears but not when she was flying No fevers or chills Onset a week ago  Anxiety She reports that she only taking a few xanax and has only taking 4 in the past 2 months She states that she is taking the buspar 5mg  bid and celexa and wellbutrin She also feels less depression overall   Depression screen [facility] 2/9 01/12/2019 11/10/2018 04/30/2018 04/30/2018 09/11/2017  Decreased Interest 0 0 0 0 0  Down, Depressed, Hopeless 0 0 0 0 0  PHQ - 2 Score 0 0 0 0 0  Altered sleeping - - - - -  Tired, decreased energy - - - - -  Change in appetite - - - - -  Feeling bad or failure about yourself  - - - - -  Trouble concentrating - - - - -  Moving slowly or fidgety/restless - - - - -  Suicidal thoughts - - - - -  PHQ-9 Score - - - - -  Difficult doing work/chores - - - - -    GAD 7 : Generalized Anxiety Score 01/12/2019  Nervous, Anxious, on Edge 1  Control/stop worrying 0  Worry too much - different things 0  Trouble relaxing 0  Restless 0  Easily annoyed or irritable 0  Afraid - awful might happen 0  Total GAD 7 Score 1  Anxiety Difficulty Not difficult at all     Diabetes Mellitus: Patient presents for follow up of diabetes. Symptoms: none.  Pt denies: increase appetite, nausea, paresthesia of the feet, polydipsia, polyuria, visual disturbances and vomitting. Symptoms have stabilized.  Evaluation to date has been included: hemoglobin A1C.  Home sugars: patient does not check sugars.  Lab Results  Component Value Date   HGBA1C  6.6 (A) 11/10/2018    Past Medical History:  Diagnosis Date  . Allergy   . Anxiety   . Depression   . Diabetes mellitus without complication ([place])   . Glaucoma   . Hypertension     Past Surgical History:  Procedure Laterality Date  . ABDOMINAL HYSTERECTOMY     Fibroids/DUB.  Ovaries intact.    Family History  Problem Relation Age of Onset  . Hypertension Mother   . Cancer Mother        lung cancer  . Multiple sclerosis Sister   . Diabetes Maternal Grandmother   . Hypertension Maternal Grandmother   . Arthritis Sister   . Mental retardation Sister   . Breast cancer Cousin   . Breast cancer Cousin     Social History   Socioeconomic History  . Marital status: Divorced    Spouse name: Not on file  . Number of children: 0  . Years of education: Not on file  . Highest education level: Not on file  Occupational History  . Occupation: Camera operator  . Occupation: DIRECTOR OF Scientist, clinical (histocompatibility and immunogenetics): [employer]  Social Needs  .  Financial resource strain: Not on file  . Food insecurity:    Worry: Not on file    Inability: Not on file  . Transportation needs:    Medical: Not on file    Non-medical: Not on file  Tobacco Use  . Smoking status: Former Smoker    Packs/day: 0.25    Types: Cigarettes    Last attempt to quit: 12/24/2010    Years since quitting: 8.0  . Smokeless tobacco: Never Used  Substance and Sexual Activity  . Alcohol use: Yes    Alcohol/week: 3.0 standard drinks    Types: 3 Glasses of wine per week    Comment: wine  . Drug use: No  . Sexual activity: Not Currently    Birth control/protection: Post-menopausal, Surgical  Lifestyle  . Physical activity:    Days per week: Not on file    Minutes per session: Not on file  . Stress: Not on file  Relationships  . Social connections:    Talks on phone: Not on file    Gets together: Not on file    Attends religious service: Not on file    Active member of club or  organization: Not on file    Attends meetings of clubs or organizations: Not on file    Relationship status: Not on file  . Intimate partner violence:    Fear of current or ex partner: Not on file    Emotionally abused: Not on file    Physically abused: Not on file    Forced sexual activity: Not on file  Other Topics Concern  . 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      Tobacco: none; quit age 44.      Alcohol:  Socially; 3-4 times per week; wine one glass each episode      Exercise:  Sporadic.  Walking some. No yoga in 2019.  Job is very active.      Seatbelt: 100%; never texting while driving    Outpatient Medications Prior to Visit  Medication Sig Dispense Refill  . ACIPHEX 20 MG tablet Take 2 tablets (40 mg total) by mouth daily. 180 tablet 1  . ALPRAZolam (XANAX) 0.5 MG tablet TAKE 1 TABLET BY MOUTH EVERY DAY AS NEEDED FOR ANXIETY 30 tablet 2  . aspirin 81 MG tablet Take 81 mg by mouth daily.    Marland Kitchen azelastine (ASTELIN) 0.1 % nasal spray Place 2 sprays into both nostrils 2 (two) times daily. Use in each nostril as directed 30 mL 12  . Blood Glucose Monitoring Suppl (CONTOUR BLOOD GLUCOSE SYSTEM) DEVI Use to check blood sugar 3 times a week. Dx code: E11.9. 1 Device 0  . buPROPion (WELLBUTRIN XL) 150 MG 24 hr tablet TAKE 1 TABLET(150 MG) BY MOUTH DAILY 90 tablet 1  . citalopram (CELEXA) 40 MG tablet TAKE 1 TABLET(40 MG) BY MOUTH DAILY 90 tablet 3  . fluticasone (FLONASE) 50 MCG/ACT nasal spray SHAKE LIQUID AND USE 2 SPRAYS IN EACH NOSTRIL DAILY 48 g 3  . hydrocortisone (ANUCORT-HC) 25 MG suppository Place 1 suppository (25 mg total) rectally 2 (two) times daily. 24 suppository 3  . lisinopril-hydrochlorothiazide (PRINZIDE,ZESTORETIC) 20-12.5 MG tablet Take 2 tablets by mouth  daily.  180 tablet 3  . metFORMIN (GLUCOPHAGE) 500 MG tablet TAKE 1 TABLET(500 MG) BY MOUTH TWICE DAILY WITH A MEAL 180 tablet 3  . minoxidil (LONITEN) 2.5 MG tablet Take 5 mg by mouth daily.    . rosuvastatin (CRESTOR) 10 MG tablet TAKE 1 TABLET(10 MG) BY MOUTH DAILY 90 tablet 3  . busPIRone (BUSPAR) 5 MG tablet Take 1 tablet (5 mg total) by mouth 3 (three) times daily. 30 tablet 0  . glucose blood (ONE TOUCH ULTRA TEST) test strip Check blood sugar once daily 100 each 3  . Lancets (ONETOUCH ULTRASOFT) lancets Check sugar once daily 100 each 3   No facility-administered medications prior to visit.     Allergies  Allergen Reactions  . Biaxin [Clarithromycin] Rash    ROS Review of Systems    Objective:    Physical Exam  BP (!) 144/77   Pulse 80   Temp 98.9 F (37.2 C) (Oral)   Resp 18   Ht 5' 5.55" (1.665 m)   Wt 222 lb 3.2 oz (100.8 kg)   SpO2 97%   BMI 36.36 kg/m  Wt Readings from Last 3 Encounters:  01/12/19 222 lb 3.2 oz (100.8 kg)  11/10/18 218 lb 9.6 oz (99.2 kg)  04/30/18 215 lb (97.5 kg)   General: alert, oriented, in NAD Head: normocephalic, atraumatic, + sinus tenderness Eyes: EOM intact, no scleral icterus or conjunctival injection Ears: TM clear bilaterally, bulging with clear fluid Nose: mucosa nonerythematous, nonedematous Throat: no pharyngeal exudate or erythema Lymph: no posterior auricular, submental or cervical lymph adenopathy Heart: normal rate, normal sinus rhythm, no murmurs Lungs: clear to auscultation bilaterally, no wheezing    Health Maintenance Due  Topic Date Due  . OPHTHALMOLOGY EXAM  07/16/2018    There are no preventive care reminders to display for this patient.  Lab Results  Component Value Date   TSH 1.260 04/30/2018   Lab Results  Component Value Date   WBC 10.1 04/30/2018   HGB 15.7 04/30/2018   HCT 47.4 (H) 04/30/2018   MCV 92 04/30/2018   PLT 291 04/30/2018   Lab Results  Component Value Date   NA 137  11/10/2018   K 4.0 11/10/2018   CO2 25 11/10/2018   GLUCOSE 116 (H) 11/10/2018   BUN 12 11/10/2018   CREATININE 0.79 11/10/2018   BILITOT 0.5 11/10/2018   ALKPHOS 114 11/10/2018   AST 25 11/10/2018   ALT 30 11/10/2018   PROT 7.0 11/10/2018   ALBUMIN 4.8 11/10/2018   CALCIUM 10.0 11/10/2018   Lab Results  Component Value Date   CHOL 139 04/30/2018   Lab Results  Component Value Date   HDL 48 04/30/2018   Lab Results  Component Value Date   LDLCALC 68 04/30/2018   Lab Results  Component Value Date   TRIG 113 04/30/2018   Lab Results  Component Value Date   CHOLHDL 2.9 04/30/2018   Lab Results  Component Value Date   HGBA1C 6.6 (A) 11/10/2018      Assessment & Plan:   Problem List Items Addressed This Visit      Endocrine   Diabetes mellitus ([place])  - diabetes well controlled, a1c in range Refilled lancets and test strips Advised pt that prednisone can raise blood glucose     Other   Anxiety and depression  - discussed that she should continue buspar Not requiring xanax   Relevant Medications   busPIRone (BUSPAR) 5 MG tablet    Other Visit Diagnoses  Disorder of both eustachian tubes    -  Primary Prednisone for 4 days then flonase Advised pt to continue otc meds   Relevant Medications   predniSONE (DELTASONE) 10 MG tablet   Sinus congestion    - continue flonase, add prednisone    Relevant Medications   predniSONE (DELTASONE) 10 MG tablet      Meds ordered this encounter  Medications  . Lancets (ONETOUCH ULTRASOFT) lancets    Sig: Check sugar once daily. E11.9    Dispense:  100 each    Refill:  3  . glucose blood (ONE TOUCH ULTRA TEST) test strip    Sig: Check blood sugar once daily. E11.9    Dispense:  100 each    Refill:  3  . busPIRone (BUSPAR) 5 MG tablet    Sig: Take 1 tablet (5 mg total) by mouth 3 (three) times daily.    Dispense:  30 tablet    Refill:  3    Pt does not need today but needs refills  . predniSONE (DELTASONE) 10  MG tablet    Sig: Take 4 tablets (40 mg total) by mouth daily with breakfast for 4 days.    Dispense:  16 tablet    Refill:  0    Follow-up: No follow-ups on file.    Forrest Moron, MD

## 2019-01-15 ENCOUNTER — Ambulatory Visit: Payer: BLUE CROSS/BLUE SHIELD | Admitting: Psychology

## 2019-01-15 DIAGNOSIS — F411 Generalized anxiety disorder: Secondary | ICD-10-CM

## 2019-01-31 ENCOUNTER — Other Ambulatory Visit: Payer: Self-pay

## 2019-01-31 MED ORDER — BUPROPION HCL ER (XL) 150 MG PO TB24: ORAL_TABLET | ORAL | 1 refills | Status: DC

## 2019-01-31 NOTE — Telephone Encounter (Signed)
Rx refill of Wellbutruin sent to pharmacy

## 2019-02-27 ENCOUNTER — Ambulatory Visit: Payer: BLUE CROSS/BLUE SHIELD | Admitting: Psychology

## 2019-02-27 DIAGNOSIS — F411 Generalized anxiety disorder: Secondary | ICD-10-CM

## 2019-03-29 ENCOUNTER — Other Ambulatory Visit: Payer: Self-pay | Admitting: Family Medicine

## 2019-03-29 DIAGNOSIS — F419 Anxiety disorder, unspecified: Principal | ICD-10-CM

## 2019-03-29 DIAGNOSIS — F32A Depression, unspecified: Secondary | ICD-10-CM

## 2019-03-29 DIAGNOSIS — F329 Major depressive disorder, single episode, unspecified: Secondary | ICD-10-CM

## 2019-04-09 ENCOUNTER — Ambulatory Visit (INDEPENDENT_AMBULATORY_CARE_PROVIDER_SITE_OTHER): Payer: BLUE CROSS/BLUE SHIELD | Admitting: Psychology

## 2019-04-09 DIAGNOSIS — F411 Generalized anxiety disorder: Secondary | ICD-10-CM | POA: Diagnosis not present

## 2019-04-27 ENCOUNTER — Telehealth: Payer: Self-pay | Admitting: Family Medicine

## 2019-04-27 ENCOUNTER — Other Ambulatory Visit: Payer: Self-pay | Admitting: *Deleted

## 2019-04-27 MED ORDER — ROSUVASTATIN CALCIUM 10 MG PO TABS: ORAL_TABLET | ORAL | 0 refills | Status: DC

## 2019-04-27 NOTE — Telephone Encounter (Signed)
Prescription sent

## 2019-04-27 NOTE — Telephone Encounter (Signed)
Copied from [place] [id number]. Topic: Quick Communication - Rx Refill/Question >> Apr 27, 2019 10:52 AM Loma Boston wrote: Medication rosuvastatin (CRESTOR) 10 MG tablet [facility] [place], [place] [phone] (Phone) [phone] (Fax)

## 2019-05-15 ENCOUNTER — Ambulatory Visit (INDEPENDENT_AMBULATORY_CARE_PROVIDER_SITE_OTHER): Payer: BLUE CROSS/BLUE SHIELD | Admitting: Psychology

## 2019-05-15 DIAGNOSIS — F411 Generalized anxiety disorder: Secondary | ICD-10-CM | POA: Diagnosis not present

## 2019-05-21 ENCOUNTER — Other Ambulatory Visit: Payer: Self-pay

## 2019-05-21 ENCOUNTER — Ambulatory Visit (INDEPENDENT_AMBULATORY_CARE_PROVIDER_SITE_OTHER): Payer: BLUE CROSS/BLUE SHIELD | Admitting: Family Medicine

## 2019-05-21 ENCOUNTER — Encounter: Payer: Self-pay | Admitting: Family Medicine

## 2019-05-21 VITALS — BP 124/84 | HR 85 | Temp 99.0°F | Resp 17 | Ht 65.55 in | Wt 213.4 lb

## 2019-05-21 DIAGNOSIS — F329 Major depressive disorder, single episode, unspecified: Secondary | ICD-10-CM

## 2019-05-21 DIAGNOSIS — I10 Essential (primary) hypertension: Secondary | ICD-10-CM | POA: Diagnosis not present

## 2019-05-21 DIAGNOSIS — F419 Anxiety disorder, unspecified: Secondary | ICD-10-CM

## 2019-05-21 DIAGNOSIS — Z0001 Encounter for general adult medical examination with abnormal findings: Secondary | ICD-10-CM

## 2019-05-21 DIAGNOSIS — N898 Other specified noninflammatory disorders of vagina: Secondary | ICD-10-CM | POA: Diagnosis not present

## 2019-05-21 DIAGNOSIS — E119 Type 2 diabetes mellitus without complications: Secondary | ICD-10-CM

## 2019-05-21 DIAGNOSIS — Z0000 Encounter for general adult medical examination without abnormal findings: Secondary | ICD-10-CM

## 2019-05-21 DIAGNOSIS — F32A Depression, unspecified: Secondary | ICD-10-CM

## 2019-05-21 LAB — POCT WET + KOH PREP
Trich by wet prep: ABSENT
Yeast by KOH: ABSENT
Yeast by wet prep: ABSENT

## 2019-05-21 MED ORDER — METFORMIN HCL 500 MG PO TABS: ORAL_TABLET | ORAL | 3 refills | Status: DC

## 2019-05-21 MED ORDER — CITALOPRAM HYDROBROMIDE 40 MG PO TABS: ORAL_TABLET | ORAL | 3 refills | Status: AC

## 2019-05-21 MED ORDER — BUPROPION HCL ER (XL) 150 MG PO TB24: ORAL_TABLET | ORAL | 3 refills | Status: DC

## 2019-05-21 MED ORDER — BUSPIRONE HCL 5 MG PO TABS: ORAL_TABLET | ORAL | 6 refills | Status: DC

## 2019-05-21 MED ORDER — ACIPHEX 20 MG PO TBEC: 40.0000 mg | DELAYED_RELEASE_TABLET | Freq: Every day | ORAL | 1 refills | Status: DC

## 2019-05-21 MED ORDER — ROSUVASTATIN CALCIUM 10 MG PO TABS: ORAL_TABLET | ORAL | 1 refills | Status: DC

## 2019-05-21 NOTE — Patient Instructions (Addendum)
If you have lab work done today you will be contacted with your lab results within the next 2 weeks.  If you have not heard from [name] then please contact us. The fastest way to get your results is to register for My Chart.   IF you received an x-ray today, you will receive an invoice from [facility]. Please contact [facility] at [phone] with questions or concerns regarding your invoice.   IF you received labwork today, you will receive an invoice from [facility]. Please contact [facility] at [phone] with questions or concerns regarding your invoice.   Our billing staff will not be able to assist you with questions regarding bills from these companies.  You will be contacted with the lab results as soon as they are available. The fastest way to get your results is to activate your My Chart account. Instructions are located on the last page of this paperwork. If you have not heard from [name] regarding the results in 2 weeks, please contact this office.     Health Maintenance for Postmenopausal Women Menopause is a normal process in which your reproductive ability comes to an end. This process happens gradually over a span of months to years, usually between the ages of 45 and 17. Menopause is complete when you have missed 12 consecutive menstrual periods. It is important to talk with your health care provider about some of the most common conditions that affect postmenopausal women, such as heart disease, cancer, and bone loss (osteoporosis). Adopting a healthy lifestyle and getting preventive care can help to promote your health and wellness. Those actions can also lower your chances of developing some of these common conditions. What should I know about menopause? During menopause, you may experience a number of symptoms, such as:  Moderate-to-severe hot flashes.  Night sweats.  Decrease in sex drive.  Mood  swings.  Headaches.  Tiredness.  Irritability.  Memory problems.  Insomnia. Choosing to treat or not to treat menopausal changes is an individual decision that you make with your health care provider. What should I know about hormone replacement therapy and supplements? Hormone therapy products are effective for treating symptoms that are associated with menopause, such as hot flashes and night sweats. Hormone replacement carries certain risks, especially as you become older. If you are thinking about using estrogen or estrogen with progestin treatments, discuss the benefits and risks with your health care provider. What should I know about heart disease and stroke? Heart disease, heart attack, and stroke become more likely as you age. This may be due, in part, to the hormonal changes that your body experiences during menopause. These can affect how your body processes dietary fats, triglycerides, and cholesterol. Heart attack and stroke are both medical emergencies. There are many things that you can do to help prevent heart disease and stroke:  Have your blood pressure checked at least every 1-2 years. High blood pressure causes heart disease and increases the risk of stroke.  If you are 32-60 years old, ask your health care provider if you should take aspirin to prevent a heart attack or a stroke.  Do not use any tobacco products, including cigarettes, chewing tobacco, or electronic cigarettes. If you need help quitting, ask your health care provider.  It is important to eat a healthy diet and maintain a healthy weight. ? Be sure to include plenty of vegetables, fruits, low-fat dairy products, and lean protein. ? Avoid eating foods that are high in solid  fats, added sugars, or salt (sodium).  Get regular exercise. This is one of the most important things that you can do for your health. ? Try to exercise for at least 150 minutes each week. The type of exercise that you do should  increase your heart rate and make you sweat. This is known as moderate-intensity exercise. ? Try to do strengthening exercises at least twice each week. Do these in addition to the moderate-intensity exercise.  Know your numbers.Ask your health care provider to check your cholesterol and your blood glucose. Continue to have your blood tested as directed by your health care provider.  What should I know about cancer screening? There are several types of cancer. Take the following steps to reduce your risk and to catch any cancer development as early as possible. Breast Cancer  Practice breast self-awareness. ? This means understanding how your breasts normally appear and feel. ? It also means doing regular breast self-exams. Let your health care provider know about any changes, no matter how small.  If you are 40 or older, have a clinician do a breast exam (clinical breast exam or CBE) every year. Depending on your age, family history, and medical history, it may be recommended that you also have a yearly breast X-ray (mammogram).  If you have a family history of breast cancer, talk with your health care provider about genetic screening.  If you are at high risk for breast cancer, talk with your health care provider about having an MRI and a mammogram every year.  Breast cancer (BRCA) gene test is recommended for women who have family members with BRCA-related cancers. Results of the assessment will determine the need for genetic counseling and BRCA1 and for BRCA2 testing. BRCA-related cancers include these types: ? Breast. This occurs in males or females. ? Ovarian. ? Tubal. This may also be called fallopian tube cancer. ? Cancer of the abdominal or pelvic lining (peritoneal cancer). ? Prostate. ? Pancreatic. Cervical, Uterine, and Ovarian Cancer Your health care provider may recommend that you be screened regularly for cancer of the pelvic organs. These include your ovaries, uterus, and  vagina. This screening involves a pelvic exam, which includes checking for microscopic changes to the surface of your cervix (Pap test).  For women ages 21-65, health care providers may recommend a pelvic exam and a Pap test every three years. For women ages 30-65, they may recommend the Pap test and pelvic exam, combined with testing for human papilloma virus (HPV), every five years. Some types of HPV increase your risk of cervical cancer. Testing for HPV may also be done on women of any age who have unclear Pap test results.  Other health care providers may not recommend any screening for nonpregnant women who are considered low risk for pelvic cancer and have no symptoms. Ask your health care provider if a screening pelvic exam is right for you.  If you have had past treatment for cervical cancer or a condition that could lead to cancer, you need Pap tests and screening for cancer for at least 20 years after your treatment. If Pap tests have been discontinued for you, your risk factors (such as having a new sexual partner) need to be reassessed to determine if you should start having screenings again. Some women have medical problems that increase the chance of getting cervical cancer. In these cases, your health care provider may recommend that you have screening and Pap tests more often.  If you have a family   history of uterine cancer or ovarian cancer, talk with your health care provider about genetic screening.  If you have vaginal bleeding after reaching menopause, tell your health care provider.  There are currently no reliable tests available to screen for ovarian cancer. Lung Cancer Lung cancer screening is recommended for adults 55-80 years old who are at high risk for lung cancer because of a history of smoking. A yearly low-dose CT scan of the lungs is recommended if you:  Currently smoke.  Have a history of at least 30 pack-years of smoking and you currently smoke or have quit within  the past 15 years. A pack-year is smoking an average of one pack of cigarettes per day for one year. Yearly screening should:  Continue until it has been 15 years since you quit.  Stop if you develop a health problem that would prevent you from having lung cancer treatment. Colorectal Cancer  This type of cancer can be detected and can often be prevented.  Routine colorectal cancer screening usually begins at age 50 and continues through age 75.  If you have risk factors for colon cancer, your health care provider may recommend that you be screened at an earlier age.  If you have a family history of colorectal cancer, talk with your health care provider about genetic screening.  Your health care provider may also recommend using home test kits to check for hidden blood in your stool.  A small camera at the end of a tube can be used to examine your colon directly (sigmoidoscopy or colonoscopy). This is done to check for the earliest forms of colorectal cancer.  Direct examination of the colon should be repeated every 5-10 years until age 75. However, if early forms of precancerous polyps or small growths are found or if you have a family history or genetic risk for colorectal cancer, you may need to be screened more often. Skin Cancer  Check your skin from head to toe regularly.  Monitor any moles. Be sure to tell your health care provider: ? About any new moles or changes in moles, especially if there is a change in a mole's shape or color. ? If you have a mole that is larger than the size of a pencil eraser.  If any of your family members has a history of skin cancer, especially at a young age, talk with your health care provider about genetic screening.  Always use sunscreen. Apply sunscreen liberally and repeatedly throughout the day.  Whenever you are outside, protect yourself by wearing long sleeves, pants, a wide-brimmed hat, and sunglasses. What should I know about  osteoporosis? Osteoporosis is a condition in which bone destruction happens more quickly than new bone creation. After menopause, you may be at an increased risk for osteoporosis. To help prevent osteoporosis or the bone fractures that can happen because of osteoporosis, the following is recommended:  If you are 19-50 years old, get at least 1,000 mg of calcium and at least 600 mg of vitamin D per day.  If you are older than age 50 but younger than age 70, get at least 1,200 mg of calcium and at least 600 mg of vitamin D per day.  If you are older than age 70, get at least 1,200 mg of calcium and at least 800 mg of vitamin D per day. Smoking and excessive alcohol intake increase the risk of osteoporosis. Eat foods that are rich in calcium and vitamin D, and do weight-bearing exercises several times   each week as directed by your health care provider. What should I know about how menopause affects my mental health? Depression may occur at any age, but it is more common as you become older. Common symptoms of depression include:  Low or sad mood.  Changes in sleep patterns.  Changes in appetite or eating patterns.  Feeling an overall lack of motivation or enjoyment of activities that you previously enjoyed.  Frequent crying spells. Talk with your health care provider if you think that you are experiencing depression. What should I know about immunizations? It is important that you get and maintain your immunizations. These include:  Tetanus, diphtheria, and pertussis (Tdap) booster vaccine.  Influenza every year before the flu season begins.  Pneumonia vaccine.  Shingles vaccine. Your health care provider may also recommend other immunizations. This information is not intended to replace advice given to you by your health care provider. Make sure you discuss any questions you have with your health care provider. Document Released: 02/01/2006 Document Revised: 06/29/2016 Document  Reviewed: 09/13/2015 [employer] Interactive Patient Education  2019 [employer].

## 2019-05-21 NOTE — Progress Notes (Signed)
Chief Complaint  Patient presents with   Annual Exam    ? yeast infection due to antibiotic use.   Medication Refill    bupropion, citalopram, metformin, raeprazole, rosuvasatin    Subjective:  Cheryl Morton is a 63 y.o. female here for a health maintenance visit.  Patient is established pt  Patient is eating better and compliant with a diabetic diet. She is taking her metformin without side effects Lab Results  Component Value Date   HGBA1C 6.5 (H) 05/21/2019     Wt Readings from Last 3 Encounters:  05/21/19 213 lb 6.4 oz (96.8 kg)  01/12/19 222 lb 3.2 oz (100.8 kg)  11/10/18 218 lb 9.6 oz (99.2 kg)     Patient Active Problem List   Diagnosis Date Noted   Former smoker 11/10/2018   Seasonal allergic rhinitis due to pollen 09/20/2017   Pure hypercholesterolemia 03/12/2017   Anxiety and depression 07/22/2016   Class 1 obesity due to excess calories with serious comorbidity and body mass index (BMI) of 34.0 to 34.9 in adult 08/18/2013   Diabetes mellitus ([place]) 09/24/2012   GERD 06/27/2010   ESSENTIAL HYPERTENSION, BENIGN 07/25/2009    Past Medical History:  Diagnosis Date   Allergy    Anxiety    Depression    Diabetes mellitus without complication ([place])    Glaucoma    Hypertension     Past Surgical History:  Procedure Laterality Date   ABDOMINAL HYSTERECTOMY     Fibroids/DUB.  Ovaries intact.     Outpatient Medications Prior to Visit  Medication Sig Dispense Refill   ALPRAZolam (XANAX) 0.5 MG tablet TAKE 1 TABLET BY MOUTH EVERY DAY AS NEEDED FOR ANXIETY 30 tablet 2   aspirin 81 MG tablet Take 81 mg by mouth daily.     azelastine (ASTELIN) 0.1 % nasal spray Place 2 sprays into both nostrils 2 (two) times daily. Use in each nostril as directed 30 mL 12   Blood Glucose Monitoring Suppl (CONTOUR BLOOD GLUCOSE SYSTEM) DEVI Use to check blood sugar 3 times a week. Dx code: E11.9. 1 Device 0   fluticasone (FLONASE) 50 MCG/ACT nasal spray  SHAKE LIQUID AND USE 2 SPRAYS IN EACH NOSTRIL DAILY 48 g 3   glucose blood (ONE TOUCH ULTRA TEST) test strip Check blood sugar once daily. E11.9 100 each 3   hydrocortisone (ANUCORT-HC) 25 MG suppository Place 1 suppository (25 mg total) rectally 2 (two) times daily. 24 suppository 3   Lancets (ONETOUCH ULTRASOFT) lancets Check sugar once daily. E11.9 100 each 3   lisinopril-hydrochlorothiazide (PRINZIDE,ZESTORETIC) 20-12.5 MG tablet Take 2 tablets by mouth daily. 180 tablet 3   minoxidil (LONITEN) 2.5 MG tablet Take 5 mg by mouth daily.     ACIPHEX 20 MG tablet Take 2 tablets (40 mg total) by mouth daily. 180 tablet 1   buPROPion (WELLBUTRIN XL) 150 MG 24 hr tablet TAKE 1 TABLET(150 MG) BY MOUTH DAILY 90 tablet 1   busPIRone (BUSPAR) 5 MG tablet TAKE 1 TABLET(5 MG) BY MOUTH THREE TIMES DAILY 30 tablet 0   citalopram (CELEXA) 40 MG tablet TAKE 1 TABLET(40 MG) BY MOUTH DAILY 90 tablet 3   metFORMIN (GLUCOPHAGE) 500 MG tablet TAKE 1 TABLET(500 MG) BY MOUTH TWICE DAILY WITH A MEAL 180 tablet 3   rosuvastatin (CRESTOR) 10 MG tablet TAKE 1 TABLET(10 MG) BY MOUTH DAILY 90 tablet 0   No facility-administered medications prior to visit.     Allergies  Allergen Reactions   Biaxin [Clarithromycin] Rash  Family History  Problem Relation Age of Onset   Hypertension Mother    Cancer Mother        lung cancer   Multiple sclerosis Sister    Diabetes Maternal Grandmother    Hypertension Maternal Grandmother    Arthritis Sister    Mental retardation Sister    Breast cancer Cousin    Breast cancer Cousin      Social History   Socioeconomic History   Marital status: Divorced    Spouse name: Not on file   Number of children: 0   Years of education: Not on file   Highest education level: Not on file  Occupational History   Occupation: Mudlogger of [employer]   Occupation: DIRECTOR OF Scientist, clinical (histocompatibility and immunogenetics): [place] [employer]: Not on file   Food insecurity    Worry: Not on file    Inability: Not on file   Transportation needs    Medical: Not on file    Non-medical: Not on file  Tobacco Use   Smoking status: Former Smoker    Packs/day: 0.25    Types: Cigarettes    Quit date: 12/24/2010    Years since quitting: 8.4   Smokeless tobacco: Never Used  Substance and Sexual Activity   Alcohol use: Yes    Alcohol/week: 3.0 standard drinks    Types: 3 Glasses of wine per week    Comment: wine   Drug use: No   Sexual activity: Not Currently    Birth control/protection: Post-menopausal, Surgical  Lifestyle   Physical activity    Days per week: Not on file    Minutes per session: Not on file   Stress: Not on file  Relationships   Social connections    Talks on phone: Not on file    Gets together: Not on file    Attends religious service: Not on file    Active member of club or organization: Not on file    Attends meetings of clubs or organizations: Not on file    Relationship status: Not on file   Intimate partner violence    Fear of current or ex partner: Not on file    Emotionally abused: Not on file    Physically abused: Not on file    Forced sexual activity: Not on file  Other Topics Concern   Not on file  Social History Narrative   Marital status: divorced; not dating; not interested      Children:  None      LIves: alone      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      Tobacco: none; quit age 25.      Alcohol:  Socially; 3-4 times per week; wine one glass each episode      Exercise:  Sporadic.  Walking some. No yoga in 2019.  Job is very active.      Seatbelt: 100%; never texting while driving   Social History   Substance and Sexual Activity  Alcohol Use Yes   Alcohol/week: 3.0 standard drinks   Types: 3 Glasses of wine per week   Comment: wine   Social History   Tobacco Use    Smoking Status Former Smoker   Packs/day: 0.25   Types: Cigarettes   Quit date: 12/24/2010   Years since quitting: 8.4  Smokeless Tobacco Never Used  Social History   Substance and Sexual Activity  Drug Use No    GYN: Sexual Health Menstrual status: regular menses LMP: No LMP recorded. Patient has had a hysterectomy. Last pap smear: see HM section History of abnormal pap smears:    Health Maintenance: See under health Maintenance activity for review of completion dates as well. Immunization History  Administered Date(s) Administered   Influenza Split 10/18/2013, 10/24/2014, 10/25/2015   Influenza,inj,Quad PF,6+ Mos 09/04/2016, 09/11/2017, 11/10/2018   Pneumococcal Conjugate-13 02/23/2015   Pneumococcal Polysaccharide-23 03/06/2016   Tdap 02/17/2014   Zoster Recombinat (Shingrix) 10/24/2017, 03/24/2018      Depression Screen-PHQ2/9 Depression screen [facility] 2/9 05/21/2019 01/12/2019 11/10/2018 04/30/2018 04/30/2018  Decreased Interest 0 0 0 0 0  Down, Depressed, Hopeless 0 0 0 0 0  PHQ - 2 Score 0 0 0 0 0  Altered sleeping - - - - -  Tired, decreased energy - - - - -  Change in appetite - - - - -  Feeling bad or failure about yourself  - - - - -  Trouble concentrating - - - - -  Moving slowly or fidgety/restless - - - - -  Suicidal thoughts - - - - -  PHQ-9 Score - - - - -  Difficult doing work/chores - - - - -       Depression Severity and Treatment Recommendations:  0-4= None  5-9= Mild / Treatment: Support, educate to call if worse; return in one month  10-14= Moderate / Treatment: Support, watchful waiting; Antidepressant or Psycotherapy  15-19= Moderately severe / Treatment: Antidepressant OR Psychotherapy  >= 20 = Major depression, severe / Antidepressant AND Psychotherapy    Review of Systems   ROS  See HPI for ROS as well.  Review of Systems  Constitutional: Negative for activity change, appetite change, chills and fever.  HENT: Negative for  congestion, nosebleeds, trouble swallowing and voice change.   Respiratory: Negative for cough, shortness of breath and wheezing.   Gastrointestinal: Negative for diarrhea, nausea and vomiting.  Genitourinary: Negative for difficulty urinating, dysuria, flank pain and hematuria.  Musculoskeletal: Negative for back pain, joint swelling and neck pain.  Neurological: Negative for dizziness, speech difficulty, light-headedness and numbness.  See HPI. All other review of systems negative.    Objective:   Vitals:   05/21/19 1014  BP: 124/84  Pulse: 85  Resp: 17  Temp: 99 F (37.2 C)  TempSrc: Oral  SpO2: 97%  Weight: 213 lb 6.4 oz (96.8 kg)  Height: 5' 5.55" (1.665 m)    Body mass index is 34.92 kg/m.  Physical Exam  Physical Exam  Constitutional: Oriented to person, place, and time. Appears well-developed and well-nourished.  HENT:  Head: Normocephalic and atraumatic.  Eyes: Conjunctivae and EOM are normal.  Ears: TM clean, normal external canal Cardiovascular: Normal rate, regular rhythm, normal heart sounds and intact distal pulses.  No murmur heard. Pulmonary/Chest: Effort normal and breath sounds normal. No stridor. No respiratory distress. Has no wheezes.  Abdomen: non-distended, normoactive bs, soft, nontender Neurological: Is alert and oriented to person, place, and time.  Skin: Skin is warm. Capillary refill takes less than 2 seconds.  Psychiatric: Has a normal mood and affect. Behavior is normal. Judgment and thought content normal.     Assessment/Plan:   Patient was seen for a health maintenance exam.  Counseled the patient on health maintenance issues. Reviewed her health mainteance schedule and ordered appropriate tests (see orders.) Counseled on regular exercise and weight  management. Recommend regular eye exams and dental cleaning.   The following issues were addressed today for health maintenance:   Alicha was seen today for annual exam and medication  refill.  Diagnoses and all orders for this visit:  Encounter for health maintenance examination in adult  Type 2 diabetes mellitus without complication, without long-term current use of insulin ([place])- well controlled hemoglobin a1c is at goal Continue exercise Lipids monitored and renal function in range, on crestor On metformin On ACE inhibitor On asa 40m Reviewed diabetic foot care Emphasized importance of eye and dental exam    -     CMP14+EGFR -     Hemoglobin A1c -     Lipid panel -     CBC with Differential/Platelet -     metFORMIN (GLUCOPHAGE) 500 MG tablet; TAKE 1 TABLET(500 MG) BY MOUTH TWICE DAILY WITH A MEAL -     rosuvastatin (CRESTOR) 10 MG tablet; TAKE 1 TABLET(10 MG) BY MOUTH DAILY  Vaginal discharge- bv noted  -     POCT Wet + KOH Prep  Essential hypertension, benign- bp stable -     CMP14+EGFR -     Lipid panel  Anxiety and depression- discussed meds, refilled meds today, stable on current doses -     busPIRone (BUSPAR) 5 MG tablet; TAKE 1 TABLET(5 MG) BY MOUTH THREE TIMES DAILY -     buPROPion (WELLBUTRIN XL) 150 MG 24 hr tablet; TAKE 1 TABLET(150 MG) BY MOUTH DAILY -     citalopram (CELEXA) 40 MG tablet; TAKE 1 TABLET(40 MG) BY MOUTH DAILY  Other orders -     ACIPHEX 20 MG tablet; Take 2 tablets (40 mg total) by mouth daily.    No follow-ups on file.    Body mass index is 34.92 kg/m.:  Discussed the patient's BMI with patient. The BMI body mass index is 34.92 kg/m.     Future Appointments  Date Time Provider DHanna 11/23/2019  9:00 AM SForrest Moron MD [facility]   Patient Instructions       If you have lab work done today you will be contacted with your lab results within the next 2 weeks.  If you have not heard from [place] please contact [place] The fastest way to get your results is to register for My Chart.   IF you received an x-ray today, you will receive an invoice from [facility]. Please contact  [facility] at [phone] questions or concerns regarding your invoice.   IF you received labwork today, you will receive an invoice from [place] Please contact [facility] at [phone] questions or concerns regarding your invoice.   Our billing staff will not be able to assist you with questions regarding bills from these companies.  You will be contacted with the lab results as soon as they are available. The fastest way to get your results is to activate your My Chart account. Instructions are located on the last page of this paperwork. If you have not heard from [place] the results in 2 weeks, please contact this office.     Health Maintenance for Postmenopausal Women Menopause is a normal process in which your reproductive ability comes to an end. This process happens gradually over a span of months to years, usually between the ages of 425and 595 Menopause is complete when you have missed 12 consecutive menstrual periods. It is important to talk with your health care provider about some of  the most common conditions that affect postmenopausal women, such as heart disease, cancer, and bone loss (osteoporosis). Adopting a healthy lifestyle and getting preventive care can help to promote your health and wellness. Those actions can also lower your chances of developing some of these common conditions. What should I know about menopause? During menopause, you may experience a number of symptoms, such as:  Moderate-to-severe hot flashes.  Night sweats.  Decrease in sex drive.  Mood swings.  Headaches.  Tiredness.  Irritability.  Memory problems.  Insomnia. Choosing to treat or not to treat menopausal changes is an individual decision that you make with your health care provider. What should I know about hormone replacement therapy and supplements? Hormone therapy products are effective for treating symptoms that are associated with menopause, such as hot  flashes and night sweats. Hormone replacement carries certain risks, especially as you become older. If you are thinking about using estrogen or estrogen with progestin treatments, discuss the benefits and risks with your health care provider. What should I know about heart disease and stroke? Heart disease, heart attack, and stroke become more likely as you age. This may be due, in part, to the hormonal changes that your body experiences during menopause. These can affect how your body processes dietary fats, triglycerides, and cholesterol. Heart attack and stroke are both medical emergencies. There are many things that you can do to help prevent heart disease and stroke:  Have your blood pressure checked at least every 1-2 years. High blood pressure causes heart disease and increases the risk of stroke.  If you are 66-67 years old, ask your health care provider if you should take aspirin to prevent a heart attack or a stroke.  Do not use any tobacco products, including cigarettes, chewing tobacco, or electronic cigarettes. If you need help quitting, ask your health care provider.  It is important to eat a healthy diet and maintain a healthy weight. ? Be sure to include plenty of vegetables, fruits, low-fat dairy products, and lean protein. ? Avoid eating foods that are high in solid fats, added sugars, or salt (sodium).  Get regular exercise. This is one of the most important things that you can do for your health. ? Try to exercise for at least 150 minutes each week. The type of exercise that you do should increase your heart rate and make you sweat. This is known as moderate-intensity exercise. ? Try to do strengthening exercises at least twice each week. Do these in addition to the moderate-intensity exercise.  Know your numbers.Ask your health care provider to check your cholesterol and your blood glucose. Continue to have your blood tested as directed by your health care provider.  What  should I know about cancer screening? There are several types of cancer. Take the following steps to reduce your risk and to catch any cancer development as early as possible. Breast Cancer  Practice breast self-awareness. ? This means understanding how your breasts normally appear and feel. ? It also means doing regular breast self-exams. Let your health care provider know about any changes, no matter how small.  If you are 72 or older, have a clinician do a breast exam (clinical breast exam or CBE) every year. Depending on your age, family history, and medical history, it may be recommended that you also have a yearly breast X-ray (mammogram).  If you have a family history of breast cancer, talk with your health care provider about genetic screening.  If you are at  high risk for breast cancer, talk with your health care provider about having an MRI and a mammogram every year.  Breast cancer (BRCA) gene test is recommended for women who have family members with BRCA-related cancers. Results of the assessment will determine the need for genetic counseling and BRCA1 and for BRCA2 testing. BRCA-related cancers include these types: ? Breast. This occurs in males or females. ? Ovarian. ? Tubal. This may also be called fallopian tube cancer. ? Cancer of the abdominal or pelvic lining (peritoneal cancer). ? Prostate. ? Pancreatic. Cervical, Uterine, and Ovarian Cancer Your health care provider may recommend that you be screened regularly for cancer of the pelvic organs. These include your ovaries, uterus, and vagina. This screening involves a pelvic exam, which includes checking for microscopic changes to the surface of your cervix (Pap test).  For women ages 21-65, health care providers may recommend a pelvic exam and a Pap test every three years. For women ages 48-65, they may recommend the Pap test and pelvic exam, combined with testing for human papilloma virus (HPV), every five years. Some  types of HPV increase your risk of cervical cancer. Testing for HPV may also be done on women of any age who have unclear Pap test results.  Other health care providers may not recommend any screening for nonpregnant women who are considered low risk for pelvic cancer and have no symptoms. Ask your health care provider if a screening pelvic exam is right for you.  If you have had past treatment for cervical cancer or a condition that could lead to cancer, you need Pap tests and screening for cancer for at least 20 years after your treatment. If Pap tests have been discontinued for you, your risk factors (such as having a new sexual partner) need to be reassessed to determine if you should start having screenings again. Some women have medical problems that increase the chance of getting cervical cancer. In these cases, your health care provider may recommend that you have screening and Pap tests more often.  If you have a family history of uterine cancer or ovarian cancer, talk with your health care provider about genetic screening.  If you have vaginal bleeding after reaching menopause, tell your health care provider.  There are currently no reliable tests available to screen for ovarian cancer. Lung Cancer Lung cancer screening is recommended for adults 59-63 years old who are at high risk for lung cancer because of a history of smoking. A yearly low-dose CT scan of the lungs is recommended if you:  Currently smoke.  Have a history of at least 30 pack-years of smoking and you currently smoke or have quit within the past 15 years. A pack-year is smoking an average of one pack of cigarettes per day for one year. Yearly screening should:  Continue until it has been 15 years since you quit.  Stop if you develop a health problem that would prevent you from having lung cancer treatment. Colorectal Cancer  This type of cancer can be detected and can often be prevented.  Routine colorectal cancer  screening usually begins at age 62 and continues through age 20.  If you have risk factors for colon cancer, your health care provider may recommend that you be screened at an earlier age.  If you have a family history of colorectal cancer, talk with your health care provider about genetic screening.  Your health care provider may also recommend using home test kits to check for hidden blood  in your stool.  A small camera at the end of a tube can be used to examine your colon directly (sigmoidoscopy or colonoscopy). This is done to check for the earliest forms of colorectal cancer.  Direct examination of the colon should be repeated every 5-10 years until age 35. However, if early forms of precancerous polyps or small growths are found or if you have a family history or genetic risk for colorectal cancer, you may need to be screened more often. Skin Cancer  Check your skin from head to toe regularly.  Monitor any moles. Be sure to tell your health care provider: ? About any new moles or changes in moles, especially if there is a change in a mole's shape or color. ? If you have a mole that is larger than the size of a pencil eraser.  If any of your family members has a history of skin cancer, especially at a young age, talk with your health care provider about genetic screening.  Always use sunscreen. Apply sunscreen liberally and repeatedly throughout the day.  Whenever you are outside, protect yourself by wearing long sleeves, pants, a wide-brimmed hat, and sunglasses. What should I know about osteoporosis? Osteoporosis is a condition in which bone destruction happens more quickly than new bone creation. After menopause, you may be at an increased risk for osteoporosis. To help prevent osteoporosis or the bone fractures that can happen because of osteoporosis, the following is recommended:  If you are 35-49 years old, get at least 1,000 mg of calcium and at least 600 mg of vitamin D per  day.  If you are older than age 38 but younger than age 76, get at least 1,200 mg of calcium and at least 600 mg of vitamin D per day.  If you are older than age 38, get at least 1,200 mg of calcium and at least 800 mg of vitamin D per day. Smoking and excessive alcohol intake increase the risk of osteoporosis. Eat foods that are rich in calcium and vitamin D, and do weight-bearing exercises several times each week as directed by your health care provider. What should I know about how menopause affects my mental health? Depression may occur at any age, but it is more common as you become older. Common symptoms of depression include:  Low or sad mood.  Changes in sleep patterns.  Changes in appetite or eating patterns.  Feeling an overall lack of motivation or enjoyment of activities that you previously enjoyed.  Frequent crying spells. Talk with your health care provider if you think that you are experiencing depression. What should I know about immunizations? It is important that you get and maintain your immunizations. These include:  Tetanus, diphtheria, and pertussis (Tdap) booster vaccine.  Influenza every year before the flu season begins.  Pneumonia vaccine.  Shingles vaccine. Your health care provider may also recommend other immunizations. This information is not intended to replace advice given to you by your health care provider. Make sure you discuss any questions you have with your health care provider. Document Released: 02/01/2006 Document Revised: 06/29/2016 Document Reviewed: 09/13/2015 Elsevier Interactive Patient Education  2019 [employer].

## 2019-05-22 LAB — HEMOGLOBIN A1C
Est. average glucose Bld gHb Est-mCnc: 140 mg/dL
Hgb A1c MFr Bld: 6.5 % — ABNORMAL HIGH (ref 4.8–5.6)

## 2019-05-22 LAB — CBC WITH DIFFERENTIAL/PLATELET
Basophils Absolute: 0 10*3/uL (ref 0.0–0.2)
Basos: 0 %
EOS (ABSOLUTE): 0.1 10*3/uL (ref 0.0–0.4)
Eos: 1 %
Hematocrit: 46.6 % (ref 34.0–46.6)
Hemoglobin: 15.4 g/dL (ref 11.1–15.9)
Immature Grans (Abs): 0.2 10*3/uL — ABNORMAL HIGH (ref 0.0–0.1)
Immature Granulocytes: 1 %
Lymphocytes Absolute: 3.1 10*3/uL (ref 0.7–3.1)
Lymphs: 28 %
MCH: 29.8 pg (ref 26.6–33.0)
MCHC: 33 g/dL (ref 31.5–35.7)
MCV: 90 fL (ref 79–97)
Monocytes Absolute: 1 10*3/uL — ABNORMAL HIGH (ref 0.1–0.9)
Monocytes: 9 %
Neutrophils Absolute: 6.6 10*3/uL (ref 1.4–7.0)
Neutrophils: 61 %
Platelets: 323 10*3/uL (ref 150–450)
RBC: 5.16 x10E6/uL (ref 3.77–5.28)
RDW: 12.8 % (ref 11.7–15.4)
WBC: 11.1 10*3/uL — ABNORMAL HIGH (ref 3.4–10.8)

## 2019-05-22 LAB — CMP14+EGFR
ALT: 20 IU/L (ref 0–32)
AST: 17 IU/L (ref 0–40)
Albumin/Globulin Ratio: 2 (ref 1.2–2.2)
Albumin: 4.2 g/dL (ref 3.8–4.8)
Alkaline Phosphatase: 106 IU/L (ref 39–117)
BUN/Creatinine Ratio: 17 (ref 12–28)
BUN: 16 mg/dL (ref 8–27)
Bilirubin Total: 0.4 mg/dL (ref 0.0–1.2)
CO2: 22 mmol/L (ref 20–29)
Calcium: 9.6 mg/dL (ref 8.7–10.3)
Chloride: 97 mmol/L (ref 96–106)
Creatinine, Ser: 0.92 mg/dL (ref 0.57–1.00)
GFR calc Af Amer: 77 mL/min/{1.73_m2} (ref >59)
GFR calc non Af Amer: 67 mL/min/{1.73_m2} (ref >59)
Globulin, Total: 2.1 g/dL (ref 1.5–4.5)
Glucose: 116 mg/dL — ABNORMAL HIGH (ref 65–99)
Potassium: 4.6 mmol/L (ref 3.5–5.2)
Sodium: 136 mmol/L (ref 134–144)
Total Protein: 6.3 g/dL (ref 6.0–8.5)

## 2019-05-22 LAB — LIPID PANEL
Chol/HDL Ratio: 3.5 ratio (ref 0.0–4.4)
Cholesterol, Total: 144 mg/dL (ref 100–199)
HDL: 41 mg/dL (ref >39)
LDL Calculated: 74 mg/dL (ref 0–99)
Triglycerides: 146 mg/dL (ref 0–149)
VLDL Cholesterol Cal: 29 mg/dL (ref 5–40)

## 2019-06-04 ENCOUNTER — Ambulatory Visit (INDEPENDENT_AMBULATORY_CARE_PROVIDER_SITE_OTHER): Payer: BC Managed Care – PPO | Admitting: Psychology

## 2019-06-04 DIAGNOSIS — F411 Generalized anxiety disorder: Secondary | ICD-10-CM | POA: Diagnosis not present

## 2019-07-06 ENCOUNTER — Ambulatory Visit (INDEPENDENT_AMBULATORY_CARE_PROVIDER_SITE_OTHER): Payer: BC Managed Care – PPO | Admitting: Psychology

## 2019-07-06 DIAGNOSIS — F411 Generalized anxiety disorder: Secondary | ICD-10-CM | POA: Diagnosis not present

## 2019-07-07 ENCOUNTER — Telehealth: Payer: Self-pay | Admitting: Family Medicine

## 2019-07-07 NOTE — Telephone Encounter (Signed)
Pt was denied the medication Aciphex from her insurance company. She has to try and fail the following medications: esomeprazole 40mg , lansoprazole capsules, omeprazole caps, pantoprazole tabs. Denial letter placed in providers box at nurses station.

## 2019-07-15 DIAGNOSIS — H401121 Primary open-angle glaucoma, left eye, mild stage: Secondary | ICD-10-CM | POA: Diagnosis not present

## 2019-07-18 ENCOUNTER — Other Ambulatory Visit: Payer: Self-pay | Admitting: Family Medicine

## 2019-07-18 DIAGNOSIS — F32A Depression, unspecified: Secondary | ICD-10-CM

## 2019-07-18 DIAGNOSIS — F419 Anxiety disorder, unspecified: Secondary | ICD-10-CM

## 2019-07-18 DIAGNOSIS — F329 Major depressive disorder, single episode, unspecified: Secondary | ICD-10-CM

## 2019-07-20 ENCOUNTER — Encounter: Payer: Self-pay | Admitting: Family Medicine

## 2019-07-24 ENCOUNTER — Telehealth: Payer: Self-pay | Admitting: Family Medicine

## 2019-07-24 MED ORDER — ESOMEPRAZOLE MAGNESIUM 20 MG PO PACK: 20.0000 mg | PACK | Freq: Every day | ORAL | 3 refills | Status: DC

## 2019-07-24 NOTE — Telephone Encounter (Signed)
Please let the patient know that I can send in the omeprazole because the aciphex is no longer covered.  Acidphex and esmoprazole are in the same class of drug.

## 2019-07-24 NOTE — Telephone Encounter (Signed)
Left message on voicemail stallings willing to send in the omeprazole because the aciphex is no longer covered. And the Aciphex and esmoprazole are int he same class of drug.  Advised to call office back if she would like this called in. Dgaddy, CMA

## 2019-07-24 NOTE — Telephone Encounter (Signed)
Copied from [place] [phone]. Topic: General - Other >> Jul 24, 2019  1:17 PM Keene Breath wrote: Reason for CRM: Patient is returning a call to the office regarding medication.  Please call back at [phone]

## 2019-07-24 NOTE — Telephone Encounter (Signed)
Spoke with pt advised protonix 40 mg sent via to pharmacy.  Pt agreeable. Dgaddy, CMA

## 2019-08-04 ENCOUNTER — Ambulatory Visit (INDEPENDENT_AMBULATORY_CARE_PROVIDER_SITE_OTHER): Payer: BC Managed Care – PPO | Admitting: Psychology

## 2019-08-04 DIAGNOSIS — F411 Generalized anxiety disorder: Secondary | ICD-10-CM | POA: Diagnosis not present

## 2019-08-06 ENCOUNTER — Encounter: Payer: Self-pay | Admitting: Family Medicine

## 2019-08-06 ENCOUNTER — Telehealth: Payer: Self-pay | Admitting: Family Medicine

## 2019-08-06 NOTE — Telephone Encounter (Signed)
[facility] states the  esomeprazole (NEXIUM) 20 MG packet Not covered by insurance, but the capsules are.  [facility] state they soke to the pt and she doose not need the packet, and capsules are fine if you please switch.  [facility] [place], [place] [phone] (Phone) [phone] (Fax)

## 2019-08-06 NOTE — Telephone Encounter (Signed)
Please review for packet vs capsule of Nexium (esomeprazole) 20mg  for this patient, for insurance purposes.

## 2019-08-11 ENCOUNTER — Telehealth: Payer: Self-pay | Admitting: Family Medicine

## 2019-08-11 NOTE — Telephone Encounter (Signed)
Patient stated she has been calling to get her prescription corrected, it was sent in powder form and had to be in pill form   Patient would like a call back [phone]  [employer] [facility] [place]

## 2019-08-12 ENCOUNTER — Other Ambulatory Visit: Payer: Self-pay

## 2019-08-12 MED ORDER — ESOMEPRAZOLE MAGNESIUM 20 MG PO CPDR: 20.0000 mg | DELAYED_RELEASE_CAPSULE | Freq: Every day | ORAL | 3 refills | Status: DC

## 2019-08-13 ENCOUNTER — Other Ambulatory Visit: Payer: Self-pay

## 2019-08-13 MED ORDER — ESOMEPRAZOLE MAGNESIUM 20 MG PO CPDR: 20.0000 mg | DELAYED_RELEASE_CAPSULE | Freq: Every day | ORAL | 3 refills | Status: DC

## 2019-08-14 NOTE — Telephone Encounter (Signed)
Attempted to call pt. There was no answer so I left a message to call back.

## 2019-08-14 NOTE — Telephone Encounter (Signed)
Pt has called back and I have received a warm transfer. Cheryl Morton has sent pt a message that we in the process of a PA for pt. Pt states if she is able to get medication over the weekend then she will send [name] a mychart message. If not then we will contact her no later than Tuesday to tell her what the next step my be.   We have a plan and the pt stated understanding.

## 2019-08-17 ENCOUNTER — Encounter: Payer: Self-pay | Admitting: Family Medicine

## 2019-08-17 NOTE — Telephone Encounter (Signed)
Has a PA been done for this pt?

## 2019-08-19 ENCOUNTER — Telehealth: Payer: Self-pay | Admitting: Family Medicine

## 2019-08-19 NOTE — Telephone Encounter (Signed)
Patient was denied the medication Esmeprazole magnesium by her insurance company. The denial letter states that she needs to try and fail 2 of the following: esomeprazole 40 mg, lansoprazole caps, omeprazole caps. She has tried and failed esomeprazole 40 mg. Please advise.

## 2019-08-25 MED ORDER — LANSOPRAZOLE 30 MG PO CPDR: 30.0000 mg | DELAYED_RELEASE_CAPSULE | Freq: Two times a day (BID) | ORAL | 6 refills | Status: DC

## 2019-08-25 NOTE — Telephone Encounter (Signed)
Patient has been notified

## 2019-08-25 NOTE — Telephone Encounter (Signed)
Please let the patient know that I sent in Lansoprazole (which is generic for Prevacid).  She can also go on GoodRx.com and the coupon brings the cost down of esmoprazole to $14

## 2019-09-03 ENCOUNTER — Ambulatory Visit (INDEPENDENT_AMBULATORY_CARE_PROVIDER_SITE_OTHER): Payer: BC Managed Care – PPO | Admitting: Psychology

## 2019-09-03 DIAGNOSIS — F411 Generalized anxiety disorder: Secondary | ICD-10-CM | POA: Diagnosis not present

## 2019-09-29 ENCOUNTER — Ambulatory Visit (INDEPENDENT_AMBULATORY_CARE_PROVIDER_SITE_OTHER): Payer: BC Managed Care – PPO | Admitting: Psychology

## 2019-09-29 DIAGNOSIS — F411 Generalized anxiety disorder: Secondary | ICD-10-CM

## 2019-10-26 ENCOUNTER — Ambulatory Visit (INDEPENDENT_AMBULATORY_CARE_PROVIDER_SITE_OTHER): Payer: BC Managed Care – PPO | Admitting: Psychology

## 2019-10-26 DIAGNOSIS — F411 Generalized anxiety disorder: Secondary | ICD-10-CM

## 2019-11-09 DIAGNOSIS — L648 Other androgenic alopecia: Secondary | ICD-10-CM | POA: Diagnosis not present

## 2019-11-09 DIAGNOSIS — L218 Other seborrheic dermatitis: Secondary | ICD-10-CM | POA: Diagnosis not present

## 2019-11-23 ENCOUNTER — Encounter: Payer: Self-pay | Admitting: Family Medicine

## 2019-11-23 ENCOUNTER — Other Ambulatory Visit: Payer: Self-pay

## 2019-11-23 ENCOUNTER — Ambulatory Visit: Payer: BC Managed Care – PPO | Admitting: Family Medicine

## 2019-11-23 VITALS — BP 116/74 | HR 78 | Temp 98.9°F | Resp 16 | Ht 65.55 in | Wt 216.6 lb

## 2019-11-23 DIAGNOSIS — E119 Type 2 diabetes mellitus without complications: Secondary | ICD-10-CM

## 2019-11-23 DIAGNOSIS — I10 Essential (primary) hypertension: Secondary | ICD-10-CM

## 2019-11-23 DIAGNOSIS — Z23 Encounter for immunization: Secondary | ICD-10-CM

## 2019-11-23 DIAGNOSIS — J310 Chronic rhinitis: Secondary | ICD-10-CM

## 2019-11-23 DIAGNOSIS — H938X3 Other specified disorders of ear, bilateral: Secondary | ICD-10-CM

## 2019-11-23 LAB — POCT GLYCOSYLATED HEMOGLOBIN (HGB A1C): Hemoglobin A1C: 6.6 % — AB (ref 4.0–5.6)

## 2019-11-23 MED ORDER — ESOMEPRAZOLE MAGNESIUM 20 MG PO CPDR: 20.0000 mg | DELAYED_RELEASE_CAPSULE | Freq: Every day | ORAL | 3 refills | Status: AC

## 2019-11-23 MED ORDER — METFORMIN HCL 500 MG PO TABS: ORAL_TABLET | ORAL | 3 refills | Status: AC

## 2019-11-23 NOTE — Patient Instructions (Addendum)
   If you have lab work done today you will be contacted with your lab results within the next 2 weeks.  If you have not heard from us then please contact us. The fastest way to get your results is to register for My Chart.   IF you received an x-ray today, you will receive an invoice from [facility]. Please contact  Radiology at [phone] with questions or concerns regarding your invoice.   IF you received labwork today, you will receive an invoice from [facility]. Please contact [facility] at [phone] with questions or concerns regarding your invoice.   Our billing staff will not be able to assist you with questions regarding bills from these companies.  You will be contacted with the lab results as soon as they are available. The fastest way to get your results is to activate your My Chart account. Instructions are located on the last page of this paperwork. If you have not heard from us regarding the results in 2 weeks, please contact this office.    Allergic Rhinitis, Adult Allergic rhinitis is an allergic reaction that affects the mucous membrane inside the nose. It causes sneezing, a runny or stuffy nose, and the feeling of mucus going down the back of the throat (postnasal drip). Allergic rhinitis can be mild to severe. There are two types of allergic rhinitis:  Seasonal. This type is also called hay fever. It happens only during certain seasons.  Perennial. This type can happen at any time of the year. What are the causes? This condition happens when the body's defense system (immune system) responds to certain harmless substances called allergens as though they were germs.  Seasonal allergic rhinitis is triggered by pollen, which can come from grasses, trees, and weeds. Perennial allergic rhinitis may be caused by:  House dust mites.  Pet dander.  Mold spores. What are the signs or symptoms? Symptoms of this condition include:  Sneezing.  Runny  or stuffy nose (nasal congestion).  Postnasal drip.  Itchy nose.  Tearing of the eyes.  Trouble sleeping.  Daytime sleepiness. How is this diagnosed? This condition may be diagnosed based on:  Your medical history.  A physical exam.  Tests to check for related conditions, such as: ? Asthma. ? Pink eye. ? Ear infection. ? Upper respiratory infection.  Tests to find out which allergens trigger your symptoms. These may include skin or blood tests. How is this treated? There is no cure for this condition, but treatment can help control symptoms. Treatment may include:  Taking medicines that block allergy symptoms, such as antihistamines. Medicine may be given as a shot, nasal spray, or pill.  Avoiding the allergen.  Desensitization. This treatment involves getting ongoing shots until your body becomes less sensitive to the allergen. This treatment may be done if other treatments do not help.  If taking medicine and avoiding the allergen does not work, new, stronger medicines may be prescribed. Follow these instructions at home:  Find out what you are allergic to. Common allergens include smoke, dust, and pollen.  Avoid the things you are allergic to. These are some things you can do to help avoid allergens: ? Replace carpet with wood, tile, or vinyl flooring. Carpet can trap dander and dust. ? Do not smoke. Do not allow smoking in your home. ? Change your heating and air conditioning filter at least once a month. ? During allergy season:  Keep windows closed as much as possible.  Plan outdoor activities when   pollen counts are lowest. This is usually during the evening hours.  When coming indoors, change clothing and shower before sitting on furniture or bedding.  Take over-the-counter and prescription medicines only as told by your health care provider.  Keep all follow-up visits as told by your health care provider. This is important. Contact a health care provider  if:  You have a fever.  You develop a persistent cough.  You make whistling sounds when you breathe (you wheeze).  Your symptoms interfere with your normal daily activities. Get help right away if:  You have shortness of breath. Summary  This condition can be managed by taking medicines as directed and avoiding allergens.  Contact your health care provider if you develop a persistent cough or fever.  During allergy season, keep windows closed as much as possible. This information is not intended to replace advice given to you by your health care provider. Make sure you discuss any questions you have with your health care provider. Document Released: 09/04/2001 Document Revised: 11/22/2017 Document Reviewed: 01/17/2017 Elsevier Patient Education  2020 [employer].

## 2019-11-23 NOTE — Progress Notes (Signed)
Established Patient Office Visit  Subjective:  Patient ID: Cheryl Morton, female    DOB: 1956-07-29  Age: 63 y.o. MRN: [id number]  CC:  Chief Complaint  Patient presents with  . Medical Management of Chronic Issues    concerns about snoring  . Medication Refill    metformin, rx for nexium    HPI Cheryl Morton presents for   Patient reports that she is snoring loudly, she has the sensation as pressure in the ear that she wants to get out She feels like whatever is in there needs to come out She reports that lately she has been getting increasingly loud and no one can tolerate her snoring She has postnasal drip at night She has coughed herself awake in recently  Wt Readings from Last 3 Encounters:  11/23/19 216 lb 9.6 oz (98.2 kg)  05/21/19 213 lb 6.4 oz (96.8 kg)  01/12/19 222 lb 3.2 oz (100.8 kg)   Diabetes Mellitus: Patient presents for follow up of diabetes. Symptoms: none. Symptoms have stabilized. Patient denies foot ulcerations, hyperglycemia, hypoglycemia , increase appetite, paresthesia of the feet and polydipsia.  Evaluation to date has been included: hemoglobin A1C.  Home sugars: patient does not check sugars. Treatment to date: Continued metformin which has been effective.  Lab Results  Component Value Date   HGBA1C 6.5 (H) 05/21/2019   Eye Exam up to date: [facility] on [place]: up to date Ace inhibitor: Lisinopril Aspirin: aspirin Statin: Crestor  Hypertension: Patient here for follow-up of elevated blood pressure. She is exercising and is adherent to low salt diet.  Blood pressure is well controlled at home. Cardiac symptoms none. Patient denies chest pain, chest pressure/discomfort, claudication, dyspnea, exertional chest pressure/discomfort, fatigue and irregular heart beat.  Cardiovascular risk factors: diabetes mellitus and hypertension. Use of agents associated with hypertension: none. History of target organ damage: none. BP  Readings from Last 3 Encounters:  11/23/19 116/74  05/21/19 124/84  01/12/19 (!) 144/77    GERD She is doing well with Nexium Prevacid gave her more gas She denies any vomiting She states that she gets burping and metal taste without the nexium She denies side effects while on the celexa She plans to only take nexium as needed    Past Medical History:  Diagnosis Date  . Allergy   . Anxiety   . Depression   . Diabetes mellitus without complication ([place])   . Glaucoma   . Hypertension     Past Surgical History:  Procedure Laterality Date  . ABDOMINAL HYSTERECTOMY     Fibroids/DUB.  Ovaries intact.    Family History  Problem Relation Age of Onset  . Hypertension Mother   . Cancer Mother        lung cancer  . Multiple sclerosis Sister   . Diabetes Maternal Grandmother   . Hypertension Maternal Grandmother   . Arthritis Sister   . Mental retardation Sister   . Breast cancer Cousin   . Breast cancer Cousin     Social History   Socioeconomic History  . Marital status: Divorced    Spouse name: Not on file  . Number of children: 0  . Years of education: Not on file  . Highest education level: Not on file  Occupational History  . Occupation: Camera operator  . Occupation: DIRECTOR OF Scientist, clinical (histocompatibility and immunogenetics): [employer]  Social Needs  . Financial resource strain: Not on file  .  Food insecurity    Worry: Not on file    Inability: Not on file  . Transportation needs    Medical: Not on file    Non-medical: Not on file  Tobacco Use  . Smoking status: Former Smoker    Packs/day: 0.25    Types: Cigarettes    Quit date: 12/24/2010    Years since quitting: 8.9  . Smokeless tobacco: Never Used  Substance and Sexual Activity  . Alcohol use: Yes    Alcohol/week: 3.0 standard drinks    Types: 3 Glasses of wine per week    Comment: wine  . Drug use: No  . Sexual activity: Not Currently    Birth control/protection: Post-menopausal,  Surgical  Lifestyle  . Physical activity    Days per week: Not on file    Minutes per session: Not on file  . Stress: Not on file  Relationships  . Social Herbalist on phone: Not on file    Gets together: Not on file    Attends religious service: Not on file    Active member of club or organization: Not on file    Attends meetings of clubs or organizations: Not on file    Relationship status: Not on file  . Intimate partner violence    Fear of current or ex partner: Not on file    Emotionally abused: Not on file    Physically abused: Not on file    Forced sexual activity: Not on file  Other Topics Concern  . 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      Tobacco: none; quit age 71.      Alcohol:  Socially; 3-4 times per week; wine one glass each episode      Exercise:  Sporadic.  Walking some. No yoga in 2019.  Job is very active.      Seatbelt: 100%; never texting while driving    Outpatient Medications Prior to Visit  Medication Sig Dispense Refill  . ACIPHEX 20 MG tablet Take 2 tablets (40 mg total) by mouth daily. 180 tablet 1  . azelastine (ASTELIN) 0.1 % nasal spray Place 2 sprays into both nostrils 2 (two) times daily. Use in each nostril as directed 30 mL 12  . Blood Glucose Monitoring Suppl (CONTOUR BLOOD GLUCOSE SYSTEM) DEVI Use to check blood sugar 3 times a week. Dx code: E11.9. 1 Device 0  . buPROPion (WELLBUTRIN XL) 150 MG 24 hr tablet TAKE 1 TABLET(150 MG) BY MOUTH DAILY 90 tablet 3  . busPIRone (BUSPAR) 5 MG tablet TAKE 1 TABLET(5 MG) BY MOUTH THREE TIMES DAILY 90 tablet 6  . citalopram (CELEXA) 40 MG tablet TAKE 1 TABLET(40 MG) BY MOUTH DAILY 90 tablet 3  . fluticasone (FLONASE) 50 MCG/ACT nasal spray SHAKE LIQUID AND USE 2 SPRAYS IN EACH NOSTRIL  DAILY 48 g 3  . glucose blood (ONE TOUCH ULTRA TEST) test strip Check blood sugar once daily. E11.9 100 each 3  . hydrocortisone (ANUCORT-HC) 25 MG suppository Place 1 suppository (25 mg total) rectally 2 (two) times daily. 24 suppository 3  . Lancets (ONETOUCH ULTRASOFT) lancets Check sugar once daily. E11.9 100 each 3  . lisinopril-hydrochlorothiazide (PRINZIDE,ZESTORETIC) 20-12.5 MG  tablet Take 2 tablets by mouth daily. 180 tablet 3  . rosuvastatin (CRESTOR) 10 MG tablet TAKE 1 TABLET(10 MG) BY MOUTH DAILY 90 tablet 1  . esomeprazole (NEXIUM) 20 MG capsule Take 1 capsule (20 mg total) by mouth daily before breakfast. 90 capsule 3  . metFORMIN (GLUCOPHAGE) 500 MG tablet TAKE 1 TABLET(500 MG) BY MOUTH TWICE DAILY WITH A MEAL 180 tablet 3  . ALPRAZolam (XANAX) 0.5 MG tablet TAKE 1 TABLET BY MOUTH EVERY DAY AS NEEDED FOR ANXIETY (Patient not taking: Reported on 11/23/2019) 30 tablet 2  . aspirin 81 MG tablet Take 81 mg by mouth daily.    . minoxidil (LONITEN) 2.5 MG tablet Take 5 mg by mouth daily.    . lansoprazole (PREVACID) 30 MG capsule Take 1 capsule (30 mg total) by mouth 2 (two) times daily before a meal. (Patient not taking: Reported on 11/23/2019) 60 capsule 6   No facility-administered medications prior to visit.     Allergies  Allergen Reactions  . Biaxin [Clarithromycin] Rash    ROS Review of Systems Review of Systems  Constitutional: Negative for activity change, appetite change, chills and fever.  HENT: Negative for congestion, nosebleeds, trouble swallowing and voice change.  See hpi Respiratory: Negative for cough, shortness of breath and wheezing.   Gastrointestinal: Negative for diarrhea, nausea and vomiting.  Genitourinary: Negative for difficulty urinating, dysuria, flank pain and hematuria.  Musculoskeletal: Negative for back pain, joint swelling and neck pain.  Neurological: Negative for dizziness, speech difficulty, light-headedness and numbness.  See HPI. All  other review of systems negative.     Objective:    Physical Exam  BP 116/74 (BP Location: Right Arm, Patient Position: Sitting, Cuff Size: Large)   Pulse 78   Temp 98.9 F (37.2 C) (Oral)   Resp 16   Ht 5' 5.55" (1.665 m)   Wt 216 lb 9.6 oz (98.2 kg)   SpO2 97%   BMI 35.44 kg/m  Wt Readings from Last 3 Encounters:  11/23/19 216 lb 9.6 oz (98.2 kg)  05/21/19 213 lb 6.4 oz (96.8 kg)  01/12/19 222 lb 3.2 oz (100.8 kg)   Epworth Sleepiness Scale 5 (normal sleep)  Physical Exam  Constitutional: Oriented to person, place, and time. Appears well-developed and well-nourished.  HENT:  Head: Normocephalic and atraumatic.  Eyes: Conjunctivae and EOM are normal.  Ears: bilateral TM with pearly surface, bulging, slightly cloudy fluid, no exudate or erythema Cardiovascular: Normal rate, regular rhythm, normal heart sounds and intact distal pulses.  No murmur heard. Pulmonary/Chest: Effort normal and breath sounds normal. No stridor. No respiratory distress. Has no wheezes.  Neurological: Is alert and oriented to person, place, and time.  Skin: Skin is warm. Capillary refill takes less than 2 seconds.  Psychiatric: Has a normal mood and affect. Behavior is normal. Judgment and thought content normal.   Health Maintenance Due  Topic Date Due  . OPHTHALMOLOGY EXAM  07/16/2018  . HEMOGLOBIN A1C  11/21/2019    There are no preventive care reminders to display for this patient.  Lab Results  Component Value Date   TSH 1.260 04/30/2018   Lab Results  Component Value Date   WBC 11.1 (H) 05/21/2019   HGB 15.4 05/21/2019   HCT 46.6 05/21/2019   MCV 90 05/21/2019   PLT 323 05/21/2019   Lab Results  Component Value Date   NA 136 05/21/2019   K 4.6 05/21/2019   CO2 22 05/21/2019   GLUCOSE 116 (H) 05/21/2019  BUN 16 05/21/2019   CREATININE 0.92 05/21/2019   BILITOT 0.4 05/21/2019   ALKPHOS 106 05/21/2019   AST 17 05/21/2019   ALT 20 05/21/2019   PROT 6.3 05/21/2019    ALBUMIN 4.2 05/21/2019   CALCIUM 9.6 05/21/2019   Lab Results  Component Value Date   CHOL 144 05/21/2019   Lab Results  Component Value Date   HDL 41 05/21/2019   Lab Results  Component Value Date   LDLCALC 74 05/21/2019   Lab Results  Component Value Date   TRIG 146 05/21/2019   Lab Results  Component Value Date   CHOLHDL 3.5 05/21/2019   Lab Results  Component Value Date   HGBA1C 6.5 (H) 05/21/2019      Assessment & Plan:   Problem List Items Addressed This Visit      Cardiovascular and Mediastinum   ESSENTIAL HYPERTENSION, BENIGN- Patient's blood pressure is at goal of 139/89 or less. Condition is stable. Continue current medications and treatment plan. I recommend that you exercise for 30-45 minutes 5 days a week. I also recommend a balanced diet with fruits and vegetables every day, lean meats, and little fried foods. The DASH diet (you can find this online) is a good example of this.    Relevant Orders   CMET with GFR     Endocrine   Diabetes mellitus ([place]) - Primary well controlled hemoglobin a1c is at goal Continue exercise Lipids monitored and renal function in range On metformin On ace or arb On asa 81mg  Reviewed diabetic foot care Emphasized importance of eye and dental exam      Relevant Medications   metFORMIN (GLUCOPHAGE) 500 MG tablet   Other Relevant Orders   POCT glycosylated hemoglobin (Hb A1C)    Other Visit Diagnoses       Ear pressure, bilateral    -  Advised antihistamine and flonase but given the increased pressure and the snoring will refer to ENT   Relevant Orders   Ambulatory referral to ENT   Chronic rhinitis       Relevant Orders   Ambulatory referral to ENT      Meds ordered this encounter  Medications  . esomeprazole (NEXIUM) 20 MG capsule    Sig: Take 1 capsule (20 mg total) by mouth daily before breakfast.    Dispense:  90 capsule    Refill:  3  . metFORMIN (GLUCOPHAGE) 500 MG tablet    Sig: TAKE 1  TABLET(500 MG) BY MOUTH TWICE DAILY WITH A MEAL    Dispense:  180 tablet    Refill:  3    Follow-up: Return in about 6 months (around 05/22/2020) for physical exam .    Forrest Moron, MD

## 2019-11-24 LAB — CMP14+EGFR
ALT: 31 IU/L (ref 0–32)
AST: 26 IU/L (ref 0–40)
Albumin/Globulin Ratio: 1.9 (ref 1.2–2.2)
Albumin: 4.4 g/dL (ref 3.8–4.8)
Alkaline Phosphatase: 105 IU/L (ref 39–117)
BUN/Creatinine Ratio: 15 (ref 12–28)
BUN: 14 mg/dL (ref 8–27)
Bilirubin Total: 0.4 mg/dL (ref 0.0–1.2)
CO2: 24 mmol/L (ref 20–29)
Calcium: 10.1 mg/dL (ref 8.7–10.3)
Chloride: 99 mmol/L (ref 96–106)
Creatinine, Ser: 0.94 mg/dL (ref 0.57–1.00)
GFR calc Af Amer: 75 mL/min/{1.73_m2} (ref >59)
GFR calc non Af Amer: 65 mL/min/{1.73_m2} (ref >59)
Globulin, Total: 2.3 g/dL (ref 1.5–4.5)
Glucose: 123 mg/dL — ABNORMAL HIGH (ref 65–99)
Potassium: 4 mmol/L (ref 3.5–5.2)
Sodium: 137 mmol/L (ref 134–144)
Total Protein: 6.7 g/dL (ref 6.0–8.5)

## 2019-11-25 ENCOUNTER — Ambulatory Visit (INDEPENDENT_AMBULATORY_CARE_PROVIDER_SITE_OTHER): Payer: BC Managed Care – PPO | Admitting: Psychology

## 2019-11-25 DIAGNOSIS — F411 Generalized anxiety disorder: Secondary | ICD-10-CM

## 2019-11-28 DIAGNOSIS — Z20828 Contact with and (suspected) exposure to other viral communicable diseases: Secondary | ICD-10-CM | POA: Diagnosis not present

## 2019-11-30 ENCOUNTER — Other Ambulatory Visit: Payer: Self-pay

## 2019-11-30 ENCOUNTER — Ambulatory Visit (INDEPENDENT_AMBULATORY_CARE_PROVIDER_SITE_OTHER): Payer: BC Managed Care – PPO | Admitting: Otolaryngology

## 2019-11-30 ENCOUNTER — Encounter (INDEPENDENT_AMBULATORY_CARE_PROVIDER_SITE_OTHER): Payer: Self-pay | Admitting: Otolaryngology

## 2019-11-30 VITALS — Temp 93.7°F

## 2019-11-30 DIAGNOSIS — R0683 Snoring: Secondary | ICD-10-CM | POA: Diagnosis not present

## 2019-11-30 DIAGNOSIS — H608X3 Other otitis externa, bilateral: Secondary | ICD-10-CM

## 2019-11-30 NOTE — Progress Notes (Signed)
HPI: Cheryl Morton is a 63 y.o. female who presents is referred by her PCP for evaluation of ear complaints as well as recent increase in her snoring.  She denies any recent change in her hearing but she complains of chronic itching in her ears and sometimes pain or discomfort in her ears.  She has also noticed increased snoring over the past few months.  She denies any significant trouble breathing through her nose.  Has complained of some mild postnasal drainage.  But no real sinus symptoms or signs of infection.  No yellow-green discharge no fever..  Past Medical History:  Diagnosis Date  . Allergy   . Anxiety   . Depression   . Diabetes mellitus without complication ([place])   . Glaucoma   . Hypertension    Past Surgical History:  Procedure Laterality Date  . ABDOMINAL HYSTERECTOMY     Fibroids/DUB.  Ovaries intact.   Social History   Socioeconomic History  . Marital status: Divorced    Spouse name: Not on file  . Number of children: 0  . Years of education: Not on file  . Highest education level: Not on file  Occupational History  . Occupation: Camera operator  . Occupation: DIRECTOR OF Scientist, clinical (histocompatibility and immunogenetics): [employer]  Social Needs  . Financial resource strain: Not on file  . Food insecurity    Worry: Not on file    Inability: Not on file  . Transportation needs    Medical: Not on file    Non-medical: Not on file  Tobacco Use  . Smoking status: Former Smoker    Packs/day: 0.25    Types: Cigarettes    Quit date: 12/24/2010    Years since quitting: 8.9  . Smokeless tobacco: Never Used  Substance and Sexual Activity  . Alcohol use: Yes    Alcohol/week: 3.0 standard drinks    Types: 3 Glasses of wine per week    Comment: wine  . Drug use: No  . Sexual activity: Not Currently    Birth control/protection: Post-menopausal, Surgical  Lifestyle  . Physical activity    Days per week: Not on file    Minutes per session: Not on file  .  Stress: Not on file  Relationships  . Social Herbalist on phone: Not on file    Gets together: Not on file    Attends religious service: Not on file    Active member of club or organization: Not on file    Attends meetings of clubs or organizations: Not on file    Relationship status: Not on file  Other Topics Concern  . 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      Tobacco: none; quit age 42.      Alcohol:  Socially; 3-4 times per week; wine one glass each episode      Exercise:  Sporadic.  Walking some. No yoga in 2019.  Job is very active.      Seatbelt: 100%; never texting while driving   Family History  Problem Relation Age of Onset  . Hypertension Mother   . Cancer Mother        lung  cancer  . Multiple sclerosis Sister   . Diabetes Maternal Grandmother   . Hypertension Maternal Grandmother   . Arthritis Sister   . Mental retardation Sister   . Breast cancer Cousin   . Breast cancer Cousin    Allergies  Allergen Reactions  . Biaxin [Clarithromycin] Rash   Prior to Admission medications   Medication Sig Start Date End Date Taking? Authorizing Provider  ACIPHEX 20 MG tablet Take 2 tablets (40 mg total) by mouth daily. 05/21/19  Yes Stallings, Zoe A, MD  ALPRAZolam (XANAX) 0.5 MG tablet TAKE 1 TABLET BY MOUTH EVERY DAY AS NEEDED FOR ANXIETY 11/10/18  Yes Stallings, Zoe A, MD  aspirin 81 MG tablet Take 81 mg by mouth daily.   Yes [provider]  azelastine (ASTELIN) 0.1 % nasal spray Place 2 sprays into both nostrils 2 (two) times daily. Use in each nostril as directed 04/30/18  Yes Wardell Honour, MD  Blood Glucose Monitoring Suppl (CONTOUR BLOOD GLUCOSE SYSTEM) DEVI Use to check blood sugar 3 times a week. Dx code: E11.9. 03/12/16  Yes Wardell Honour,  MD  buPROPion (WELLBUTRIN XL) 150 MG 24 hr tablet TAKE 1 TABLET(150 MG) BY MOUTH DAILY 07/18/19  Yes Stallings, Zoe A, MD  busPIRone (BUSPAR) 5 MG tablet TAKE 1 TABLET(5 MG) BY MOUTH THREE TIMES DAILY 05/21/19  Yes Stallings, Zoe A, MD  citalopram (CELEXA) 40 MG tablet TAKE 1 TABLET(40 MG) BY MOUTH DAILY 05/21/19  Yes Delia Chimes A, MD  esomeprazole (NEXIUM) 20 MG capsule Take 1 capsule (20 mg total) by mouth daily before breakfast. 11/23/19  Yes Stallings, Zoe A, MD  fluticasone (FLONASE) 50 MCG/ACT nasal spray SHAKE LIQUID AND USE 2 SPRAYS IN EACH NOSTRIL DAILY 04/30/18  Yes Wardell Honour, MD  glucose blood (ONE TOUCH ULTRA TEST) test strip Check blood sugar once daily. E11.9 01/12/19  Yes Forrest Moron, MD  hydrocortisone (ANUCORT-HC) 25 MG suppository Place 1 suppository (25 mg total) rectally 2 (two) times daily. 03/12/17  Yes Wardell Honour, MD  Lancets [facility] ULTRASOFT) lancets Check sugar once daily. E11.9 01/12/19  Yes Stallings, Arlie Solomons, MD  lisinopril-hydrochlorothiazide (PRINZIDE,ZESTORETIC) 20-12.5 MG tablet Take 2 tablets by mouth daily. 11/10/18  Yes Stallings, Zoe A, MD  metFORMIN (GLUCOPHAGE) 500 MG tablet TAKE 1 TABLET(500 MG) BY MOUTH TWICE DAILY WITH A MEAL 11/23/19  Yes Stallings, Zoe A, MD  minoxidil (LONITEN) 2.5 MG tablet Take 5 mg by mouth daily.   Yes [provider]  rosuvastatin (CRESTOR) 10 MG tablet TAKE 1 TABLET(10 MG) BY MOUTH DAILY 05/21/19  Yes Stallings, Zoe A, MD     Positive ROS: Otherwise negative.  No dizziness.  All other systems have been reviewed and were otherwise negative with the exception of those mentioned in the HPI and as above.  Physical Exam: Constitutional: Alert, well-appearing, moderately obese female in no acute distress. Ears: External ears without lesions or tenderness. Ear canals are clear bilaterally with intact, clear TMs bilaterally.  On hearing screening with a tuning forks she heard well in both ears with minimal hearing  loss.  AC > BC bilaterally. Nasal: External nose without lesions. Septum with minimal deformity.  Minimal rhinitis.  No polyps noted.. Clear nasal passages with minimal clear mucus discharge.  No nasal obstruction noted. Oral: Lips and gums without lesions. Tongue and palate mucosa without lesions. Posterior oropharynx clear.  Tonsils small to average size bilaterally.  Indirect laryngoscopy revealed a clear base of tongue vallecula and epiglottis.  Neck: No palpable adenopathy or masses Respiratory: Breathing comfortably  Skin: No facial/neck lesions or rash noted.  Procedures  Assessment: Chronic ear itching with normal ear canal examination and clear TMs bilaterally. Mild rhinitis. Chronic snoring.  Questionable obstructive sleep apnea.  No significant anatomical abnormalities noted on upper airway examination.  Plan: Prescribed fluocinolone 0.01% oil drops to use in the ear 3 to 4 drops twice daily x3 days as needed itching and ear canals. Recommended regular use of nasal steroid spray Flonase or Nasacort 2 sprays each nostril at night which she already has. Recommended scheduling sleep test and use of nasal CPAP if she has significant OSA.  This can be scheduled through her PCP or our office. Concerning her snoring recommended regular use of nasal steroid spray and sleeping on her side as this will help decrease snoring versus sleeping on her back.   Radene Journey, MD   CC:

## 2019-12-09 DIAGNOSIS — L668 Other cicatricial alopecia: Secondary | ICD-10-CM | POA: Diagnosis not present

## 2019-12-31 ENCOUNTER — Other Ambulatory Visit: Payer: Self-pay | Admitting: Family Medicine

## 2019-12-31 DIAGNOSIS — Z1231 Encounter for screening mammogram for malignant neoplasm of breast: Secondary | ICD-10-CM

## 2020-01-01 ENCOUNTER — Other Ambulatory Visit: Payer: Self-pay

## 2020-01-01 ENCOUNTER — Ambulatory Visit
Admission: RE | Admit: 2020-01-01 | Discharge: 2020-01-01 | Disposition: A | Discharge status: Home / Self Care | Payer: BC Managed Care – PPO | Source: Ambulatory Visit

## 2020-01-01 ENCOUNTER — Ambulatory Visit (INDEPENDENT_AMBULATORY_CARE_PROVIDER_SITE_OTHER): Payer: BC Managed Care – PPO | Admitting: Psychology

## 2020-01-01 DIAGNOSIS — Z1231 Encounter for screening mammogram for malignant neoplasm of breast: Secondary | ICD-10-CM

## 2020-01-01 DIAGNOSIS — F411 Generalized anxiety disorder: Secondary | ICD-10-CM

## 2020-01-08 ENCOUNTER — Other Ambulatory Visit: Payer: Self-pay | Admitting: Family Medicine

## 2020-01-08 ENCOUNTER — Encounter: Payer: Self-pay | Admitting: *Deleted

## 2020-01-08 DIAGNOSIS — F419 Anxiety disorder, unspecified: Secondary | ICD-10-CM

## 2020-01-08 DIAGNOSIS — F32A Depression, unspecified: Secondary | ICD-10-CM

## 2020-01-08 DIAGNOSIS — F329 Major depressive disorder, single episode, unspecified: Secondary | ICD-10-CM

## 2020-01-12 ENCOUNTER — Other Ambulatory Visit: Payer: Self-pay | Admitting: Family Medicine

## 2020-01-12 DIAGNOSIS — I10 Essential (primary) hypertension: Secondary | ICD-10-CM

## 2020-01-12 DIAGNOSIS — E119 Type 2 diabetes mellitus without complications: Secondary | ICD-10-CM

## 2020-01-12 NOTE — Telephone Encounter (Signed)
Requested Prescriptions  Pending Prescriptions Disp Refills  . lisinopril-hydrochlorothiazide (ZESTORETIC) 20-12.5 MG tablet [Pharmacy Med Name: LISINOPRIL-HCTZ 20/12.5MG  TABLETS] 180 tablet 3    Sig: TAKE 2 TABLETS BY MOUTH DAILY     Cardiovascular:  ACEI + Diuretic Combos Passed - 01/12/2020  6:58 AM      Passed - Na in normal range and within 180 days    Sodium  Date Value Ref Range Status  11/23/2019 137 134 - 144 mmol/L Final         Passed - K in normal range and within 180 days    Potassium  Date Value Ref Range Status  11/23/2019 4.0 3.5 - 5.2 mmol/L Final         Passed - Cr in normal range and within 180 days    Creat  Date Value Ref Range Status  09/04/2016 0.86 0.50 - 1.05 mg/dL Final    Comment:      For patients > or = 63 years of age: The upper reference limit for Creatinine is approximately 13% higher for people identified as African-American.      Creatinine, Ser  Date Value Ref Range Status  11/23/2019 0.94 0.57 - 1.00 mg/dL Final         Passed - Ca in normal range and within 180 days    Calcium  Date Value Ref Range Status  11/23/2019 10.1 8.7 - 10.3 mg/dL Final         Passed - Patient is not pregnant      Passed - Last BP in normal range    BP Readings from Last 1 Encounters:  11/23/19 116/74         Passed - Valid encounter within last 6 months    Recent Outpatient Visits          1 month ago Type 2 diabetes mellitus without complication, without long-term current use of insulin ([place])   [facility] at [place], MD   7 months ago Encounter for health maintenance examination in adult   Primary Care at [place], MD   1 year ago Disorder of both eustachian tubes   Primary Care at [facility], Arlie Solomons, MD   1 year ago Anxiety and depression   Primary Care at [facility], Arlie Solomons, MD   1 year ago Routine physical examination   Primary Care at [facility], Renette Butters, MD      Future Appointments        In 4 months Forrest Moron, MD Primary Care at [place], [facility]           . rosuvastatin (CRESTOR) 10 MG tablet [Pharmacy Med Name: ROSUVASTATIN 10MG  TABLETS] 90 tablet 1    Sig: TAKE 1 TABLET(10 MG) BY MOUTH DAILY     Cardiovascular:  Antilipid - Statins Passed - 01/12/2020  6:58 AM      Passed - Total Cholesterol in normal range and within 360 days    Cholesterol, Total  Date Value Ref Range Status  05/21/2019 144 100 - 199 mg/dL Final         Passed - LDL in normal range and within 360 days    LDL Calculated  Date Value Ref Range Status  05/21/2019 74 0 - 99 mg/dL Final         Passed - HDL in normal range and within 360 days    HDL  Date Value Ref Range Status  05/21/2019 41 >39 mg/dL Final  Passed - Triglycerides in normal range and within 360 days    Triglycerides  Date Value Ref Range Status  05/21/2019 146 0 - 149 mg/dL Final         Passed - Patient is not pregnant      Passed - Valid encounter within last 12 months    Recent Outpatient Visits          1 month ago Type 2 diabetes mellitus without complication, without long-term current use of insulin ([place])   Primary Care at [facility], MD   7 months ago Encounter for health maintenance examination in adult   Primary Care at [place], MD   1 year ago Disorder of both eustachian tubes   Primary Care at [facility], Arlie Solomons, MD   1 year ago Anxiety and depression   Primary Care at [facility], Arlie Solomons, MD   1 year ago Routine physical examination   Primary Care at [facility], Renette Butters, MD      Future Appointments            In 4 months Forrest Moron, MD Primary Care at [place], [facility]

## 2020-01-26 ENCOUNTER — Ambulatory Visit: Payer: Self-pay | Admitting: Psychology

## 2020-03-01 ENCOUNTER — Ambulatory Visit (INDEPENDENT_AMBULATORY_CARE_PROVIDER_SITE_OTHER): Payer: BC Managed Care – PPO | Admitting: Psychology

## 2020-03-01 DIAGNOSIS — F411 Generalized anxiety disorder: Secondary | ICD-10-CM

## 2020-03-10 ENCOUNTER — Ambulatory Visit: Payer: BC Managed Care – PPO | Attending: Family

## 2020-03-10 DIAGNOSIS — Z23 Encounter for immunization: Secondary | ICD-10-CM

## 2020-03-10 NOTE — Progress Notes (Signed)
   Covid-19 Vaccination Clinic  Name:  Cheryl Morton    MRN: [id number] DOB: 1956/04/29  03/10/2020  [name] was observed post Covid-19 immunization for 15 minutes without incident. She was provided with Vaccine Information Sheet and instruction to access the V-Safe system.   [name] was instructed to call 911 with any severe reactions post vaccine: Marland Kitchen Difficulty breathing  . Swelling of face and throat  . A fast heartbeat  . A bad rash all over body  . Dizziness and weakness   Immunizations Administered    Name Date Dose VIS Date Route   Moderna COVID-19 Vaccine 03/10/2020  2:15 PM 0.5 mL 11/24/2019 Intramuscular   Manufacturer: Moderna   Lot: [id number]   [id number]

## 2020-03-29 ENCOUNTER — Encounter: Payer: Self-pay | Admitting: Family Medicine

## 2020-03-29 ENCOUNTER — Telehealth: Payer: Self-pay

## 2020-03-29 NOTE — Telephone Encounter (Signed)
Pt is requesting a new blood testing kit for her glucose. Last dispensed monitor was 03/12/2016   Is this acceptable

## 2020-04-01 MED ORDER — BLOOD GLUCOSE MONITOR KIT: PACK | 0 refills | Status: AC

## 2020-04-01 NOTE — Telephone Encounter (Signed)
Script for blood glucose monitor printed. Please fax to pharmacy.

## 2020-04-01 NOTE — Addendum Note (Signed)
Addended by: Delia Chimes A on: 04/01/2020 09:53 AM   Modules accepted: Orders

## 2020-04-12 ENCOUNTER — Ambulatory Visit: Payer: BC Managed Care – PPO | Attending: Family

## 2020-04-12 DIAGNOSIS — Z23 Encounter for immunization: Secondary | ICD-10-CM

## 2020-04-12 NOTE — Progress Notes (Signed)
   Covid-19 Vaccination Clinic  Name:  Cheryl Morton    MRN: [id number] DOB: 1956-08-26  04/12/2020  Cheryl Morton was observed post Covid-19 immunization for 15 minutes without incident. She was provided with Vaccine Information Sheet and instruction to access the V-Safe system.   Cheryl Morton was instructed to call 911 with any severe reactions post vaccine: Marland Kitchen Difficulty breathing  . Swelling of face and throat  . A fast heartbeat  . A bad rash all over body  . Dizziness and weakness   Immunizations Administered    Name Date Dose VIS Date Route   Moderna COVID-19 Vaccine 04/12/2020 12:31 PM 0.5 mL 11/2019 Intramuscular   Manufacturer: Moderna   Lot: [id number]   [place]:[id number]

## 2020-04-14 ENCOUNTER — Ambulatory Visit (INDEPENDENT_AMBULATORY_CARE_PROVIDER_SITE_OTHER): Payer: BC Managed Care – PPO | Admitting: Psychology

## 2020-04-14 DIAGNOSIS — F411 Generalized anxiety disorder: Secondary | ICD-10-CM

## 2020-04-15 ENCOUNTER — Ambulatory Visit: Payer: BC Managed Care – PPO | Admitting: Registered Nurse

## 2020-04-15 ENCOUNTER — Other Ambulatory Visit: Payer: Self-pay

## 2020-04-15 ENCOUNTER — Encounter: Payer: Self-pay | Admitting: Registered Nurse

## 2020-04-15 VITALS — BP 133/86 | HR 89 | Temp 97.1°F | Resp 17 | Ht 65.5 in | Wt 221.4 lb

## 2020-04-15 DIAGNOSIS — N390 Urinary tract infection, site not specified: Secondary | ICD-10-CM

## 2020-04-15 LAB — POCT URINALYSIS DIP (CLINITEK)
Bilirubin, UA: NEGATIVE
Blood, UA: NEGATIVE
Glucose, UA: NEGATIVE mg/dL
Ketones, POC UA: NEGATIVE mg/dL
Nitrite, UA: NEGATIVE
POC PROTEIN,UA: NEGATIVE
Spec Grav, UA: 1.02 (ref 1.010–1.025)
Urobilinogen, UA: 0.2 E.U./dL
pH, UA: 6.5 (ref 5.0–8.0)

## 2020-04-15 MED ORDER — SULFAMETHOXAZOLE-TRIMETHOPRIM 800-160 MG PO TABS: 1.0000 | ORAL_TABLET | Freq: Two times a day (BID) | ORAL | 0 refills | Status: DC

## 2020-04-15 NOTE — Patient Instructions (Signed)
° ° ° °  If you have lab work done today you will be contacted with your lab results within the next 2 weeks.  If you have not heard from us then please contact us. The fastest way to get your results is to register for My Chart. ° ° °IF you received an x-ray today, you will receive an invoice from [facility]. Please contact [facility] at [phone] with questions or concerns regarding your invoice.  ° °IF you received labwork today, you will receive an invoice from [facility]. Please contact [facility] at [phone] with questions or concerns regarding your invoice.  ° °Our billing staff will not be able to assist you with questions regarding bills from these companies. ° °You will be contacted with the lab results as soon as they are available. The fastest way to get your results is to activate your My Chart account. Instructions are located on the last page of this paperwork. If you have not heard from us regarding the results in 2 weeks, please contact this office. °  ° ° ° °

## 2020-04-16 ENCOUNTER — Encounter: Payer: Self-pay | Admitting: Registered Nurse

## 2020-04-16 NOTE — Progress Notes (Signed)
Acute Office Visit  Subjective:    Patient ID: Cheryl Morton, female    DOB: [birth year], 64 y.o.   MRN: [id number]  Chief Complaint  Patient presents with  . Urinary Tract Infection    patient states she might have a UTI was taking an antibiotic for her tooth then starts to have more urine frequency and some abdominal pain    HPI Patient is in today for urinary frequency and urgency. Some suprapubic pain. Ongoing for around 1 week. Mild dysuria.  No vaginal symptoms No CVA tenderness No fevers, chills, fatigue  Past Medical History:  Diagnosis Date  . Allergy   . Anxiety   . Depression   . Diabetes mellitus without complication ([place])   . Glaucoma   . Hypertension     Past Surgical History:  Procedure Laterality Date  . ABDOMINAL HYSTERECTOMY     Fibroids/DUB.  Ovaries intact.    Family History  Problem Relation Age of Onset  . Hypertension Mother   . Cancer Mother        lung cancer  . Multiple sclerosis Sister   . Diabetes Maternal Grandmother   . Hypertension Maternal Grandmother   . Arthritis Sister   . Mental retardation Sister     Social History   Socioeconomic History  . Marital status: Divorced    Spouse name: Not on file  . Number of children: 0  . Years of education: Not on file  . Highest education level: Not on file  Occupational History  . Occupation: Camera operator  . Occupation: DIRECTOR OF Scientist, clinical (histocompatibility and immunogenetics): [employer]  Tobacco Use  . Smoking status: Former Smoker    Packs/day: 0.25    Types: Cigarettes    Quit date: 12/24/2010    Years since quitting: 9.3  . Smokeless tobacco: Never Used  Substance and Sexual Activity  . Alcohol use: Yes    Alcohol/week: 3.0 standard drinks    Types: 3 Glasses of wine per week    Comment: wine  . Drug use: No  . Sexual activity: Not Currently    Birth control/protection: Post-menopausal, Surgical  Other Topics Concern  . 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      Tobacco: none; quit age 30.      Alcohol:  Socially; 3-4 times per week; wine one glass each episode      Exercise:  Sporadic.  Walking some. No yoga in 2019.  Job is very active.      Seatbelt: 100%; never texting while driving   Social Determinants of Health   Financial Resource Strain:   . Difficulty of Paying Living Expenses:   Food Insecurity:   . Worried About Charity fundraiser in the Last Year:   . Arboriculturist in the Last Year:   Transportation Needs:   . Film/video editor (Medical):   Marland Kitchen Lack of Transportation (Non-Medical):   Physical Activity:   . Days of Exercise per Week:   . Minutes of Exercise per Session:   Stress:   . Feeling of Stress :   Social Connections:   . Frequency of Communication with Friends and  Family:   . Frequency of Social Gatherings with Friends and Family:   . Attends Religious Services:   . Active Member of Clubs or Organizations:   . Attends Archivist Meetings:   Marland Kitchen Marital Status:   Intimate Partner Violence:   . Fear of Current or Ex-Partner:   . Emotionally Abused:   Marland Kitchen Physically Abused:   . Sexually Abused:     Outpatient Medications Prior to Visit  Medication Sig Dispense Refill  . ACIPHEX 20 MG tablet Take 2 tablets (40 mg total) by mouth daily. 180 tablet 1  . aspirin 81 MG tablet Take 81 mg by mouth daily.    Marland Kitchen azelastine (ASTELIN) 0.1 % nasal spray Place 2 sprays into both nostrils 2 (two) times daily. Use in each nostril as directed 30 mL 12  . blood glucose meter kit and supplies KIT Dispense based on patient and insurance preference. Use up to 3 times daily as directed. (FOR ICD-10.E11.65). 1 each 0  . Blood Glucose Monitoring Suppl (CONTOUR BLOOD GLUCOSE SYSTEM) DEVI Use to check blood sugar 3  times a week. Dx code: E11.9. 1 Device 0  . buPROPion (WELLBUTRIN XL) 150 MG 24 hr tablet TAKE 1 TABLET(150 MG) BY MOUTH DAILY 90 tablet 3  . busPIRone (BUSPAR) 5 MG tablet TAKE 1 TABLET(5 MG) BY MOUTH THREE TIMES DAILY 90 tablet 6  . citalopram (CELEXA) 40 MG tablet TAKE 1 TABLET(40 MG) BY MOUTH DAILY 90 tablet 3  . esomeprazole (NEXIUM) 20 MG capsule Take 1 capsule (20 mg total) by mouth daily before breakfast. 90 capsule 3  . fluticasone (FLONASE) 50 MCG/ACT nasal spray SHAKE LIQUID AND USE 2 SPRAYS IN EACH NOSTRIL DAILY 48 g 3  . glucose blood (ONE TOUCH ULTRA TEST) test strip Check blood sugar once daily. E11.9 100 each 3  . hydrocortisone (ANUCORT-HC) 25 MG suppository Place 1 suppository (25 mg total) rectally 2 (two) times daily. 24 suppository 3  . Lancets (ONETOUCH ULTRASOFT) lancets Check sugar once daily. E11.9 100 each 3  . lisinopril-hydrochlorothiazide (ZESTORETIC) 20-12.5 MG tablet TAKE 2 TABLETS BY MOUTH DAILY 180 tablet 3  . metFORMIN (GLUCOPHAGE) 500 MG tablet TAKE 1 TABLET(500 MG) BY MOUTH TWICE DAILY WITH A MEAL 180 tablet 3  . rosuvastatin (CRESTOR) 10 MG tablet TAKE 1 TABLET(10 MG) BY MOUTH DAILY 90 tablet 1  . ALPRAZolam (XANAX) 0.5 MG tablet TAKE 1 TABLET BY MOUTH EVERY DAY AS NEEDED FOR ANXIETY (Patient not taking: Reported on 04/15/2020) 30 tablet 2  . minoxidil (LONITEN) 2.5 MG tablet Take 5 mg by mouth daily.     No facility-administered medications prior to visit.    Allergies  Allergen Reactions  . Biaxin [Clarithromycin] Rash    Review of Systems Per hpi      Objective:    Physical Exam Vitals and nursing note reviewed.  Constitutional:      General: She is not in acute distress.    Appearance: Normal appearance. She is obese. She is not ill-appearing, toxic-appearing or diaphoretic.  Cardiovascular:     Rate and Rhythm: Normal rate and regular rhythm.  Pulmonary:     Effort: Pulmonary effort is normal. No respiratory distress.  Skin:    Capillary  Refill: Capillary refill takes less than 2 seconds.  Neurological:     General: No focal deficit present.     Mental Status: She is alert and oriented to person, place, and time. Mental status is at baseline.  Psychiatric:  Mood and Affect: Mood normal.        Behavior: Behavior normal.        Thought Content: Thought content normal.        Judgment: Judgment normal.     BP 133/86   Pulse 89   Temp (!) 97.1 F (36.2 C) (Temporal)   Resp 17   Ht 5' 5.5" (1.664 m)   Wt 221 lb 6.4 oz (100.4 kg)   SpO2 97%   BMI 36.28 kg/m  Wt Readings from Last 3 Encounters:  04/15/20 221 lb 6.4 oz (100.4 kg)  11/23/19 216 lb 9.6 oz (98.2 kg)  05/21/19 213 lb 6.4 oz (96.8 kg)    There are no preventive care reminders to display for this patient.  There are no preventive care reminders to display for this patient.   Lab Results  Component Value Date   TSH 1.260 04/30/2018   Lab Results  Component Value Date   WBC 11.1 (H) 05/21/2019   HGB 15.4 05/21/2019   HCT 46.6 05/21/2019   MCV 90 05/21/2019   PLT 323 05/21/2019   Lab Results  Component Value Date   NA 137 11/23/2019   K 4.0 11/23/2019   CO2 24 11/23/2019   GLUCOSE 123 (H) 11/23/2019   BUN 14 11/23/2019   CREATININE 0.94 11/23/2019   BILITOT 0.4 11/23/2019   ALKPHOS 105 11/23/2019   AST 26 11/23/2019   ALT 31 11/23/2019   PROT 6.7 11/23/2019   ALBUMIN 4.4 11/23/2019   CALCIUM 10.1 11/23/2019   Lab Results  Component Value Date   CHOL 144 05/21/2019   Lab Results  Component Value Date   HDL 41 05/21/2019   Lab Results  Component Value Date   LDLCALC 74 05/21/2019   Lab Results  Component Value Date   TRIG 146 05/21/2019   Lab Results  Component Value Date   CHOLHDL 3.5 05/21/2019   Lab Results  Component Value Date   HGBA1C 6.6 (A) 11/23/2019       Assessment & Plan:   Problem List Items Addressed This Visit    None    Visit Diagnoses    Urinary tract infection without hematuria, site  unspecified    -  Primary   Relevant Medications   sulfamethoxazole-trimethoprim (BACTRIM DS) 800-160 MG tablet   Other Relevant Orders   POCT URINALYSIS DIP (CLINITEK) (Completed)       Meds ordered this encounter  Medications  . sulfamethoxazole-trimethoprim (BACTRIM DS) 800-160 MG tablet    Sig: Take 1 tablet by mouth 2 (two) times daily.    Dispense:  6 tablet    Refill:  0    Order Specific Question:   Supervising Provider    Answer:   Forrest Moron [id number]   PLAN  POCT UA shows likely UTI  Bactrim po bid for three days  Return if symptoms worsen or fail to improve  Patient encouraged to call clinic with any questions, comments, or concerns.  Maximiano Coss, NP

## 2020-04-18 ENCOUNTER — Other Ambulatory Visit: Payer: Self-pay | Admitting: Registered Nurse

## 2020-04-18 DIAGNOSIS — N898 Other specified noninflammatory disorders of vagina: Secondary | ICD-10-CM

## 2020-04-18 MED ORDER — FLUCONAZOLE 150 MG PO TABS: 150.0000 mg | ORAL_TABLET | Freq: Once | ORAL | 0 refills | Status: AC

## 2020-04-18 NOTE — Telephone Encounter (Signed)
Called the patient and she stated she picked up one prescription , but she was also suppose to have something for yeast infections. Please Advise

## 2020-04-20 DIAGNOSIS — H401121 Primary open-angle glaucoma, left eye, mild stage: Secondary | ICD-10-CM | POA: Diagnosis not present

## 2020-05-24 ENCOUNTER — Encounter: Payer: BC Managed Care – PPO | Admitting: Family Medicine

## 2020-05-26 ENCOUNTER — Ambulatory Visit (INDEPENDENT_AMBULATORY_CARE_PROVIDER_SITE_OTHER): Payer: BC Managed Care – PPO | Admitting: Psychology

## 2020-05-26 DIAGNOSIS — F411 Generalized anxiety disorder: Secondary | ICD-10-CM

## 2020-06-03 ENCOUNTER — Encounter: Payer: Self-pay | Admitting: Gastroenterology

## 2020-06-11 IMAGING — MG DIGITAL SCREENING BILAT W/ TOMO W/ CAD
6 of 12 series · 6 of 36 positions shown · non-contrast
Comparison: Previous exam(s).

CLINICAL DATA: Screening.

EXAM:
DIGITAL SCREENING BILATERAL MAMMOGRAM WITH TOMO AND CAD

[R MLO synth-2D (1 of 2)]
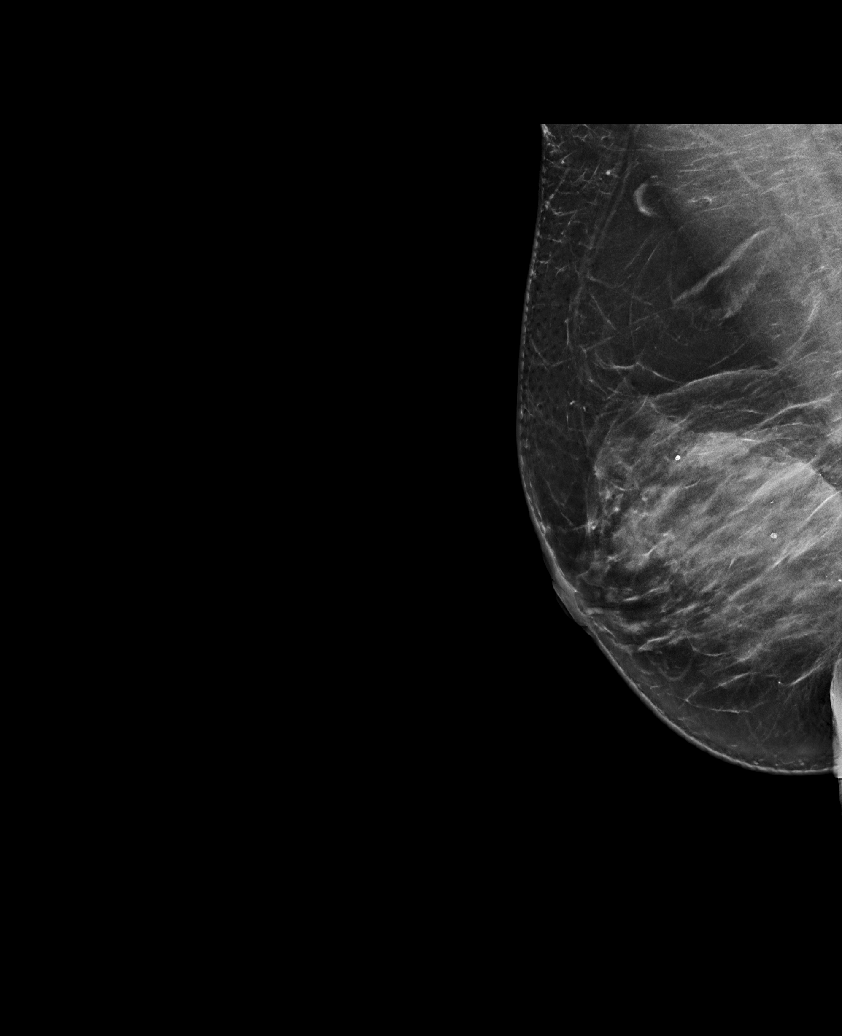

[L CC synth-2D]
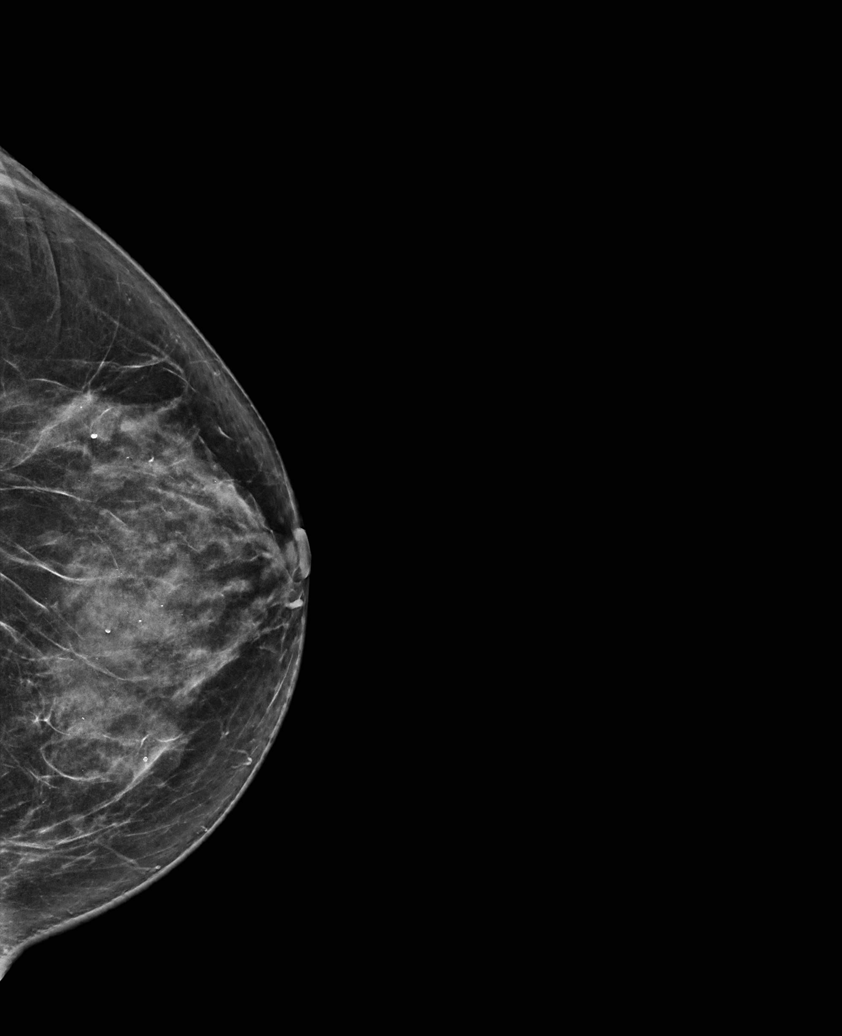

[L MLO synth-2D (1 of 2)]
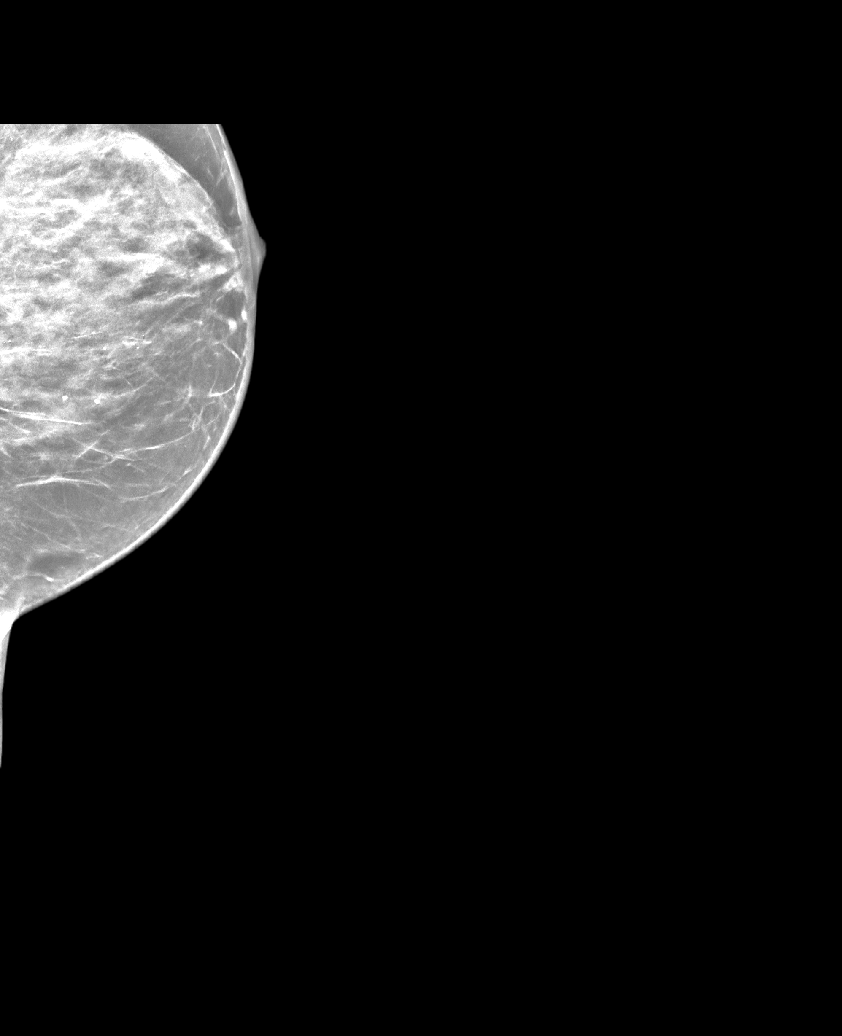

[R CC synth-2D]
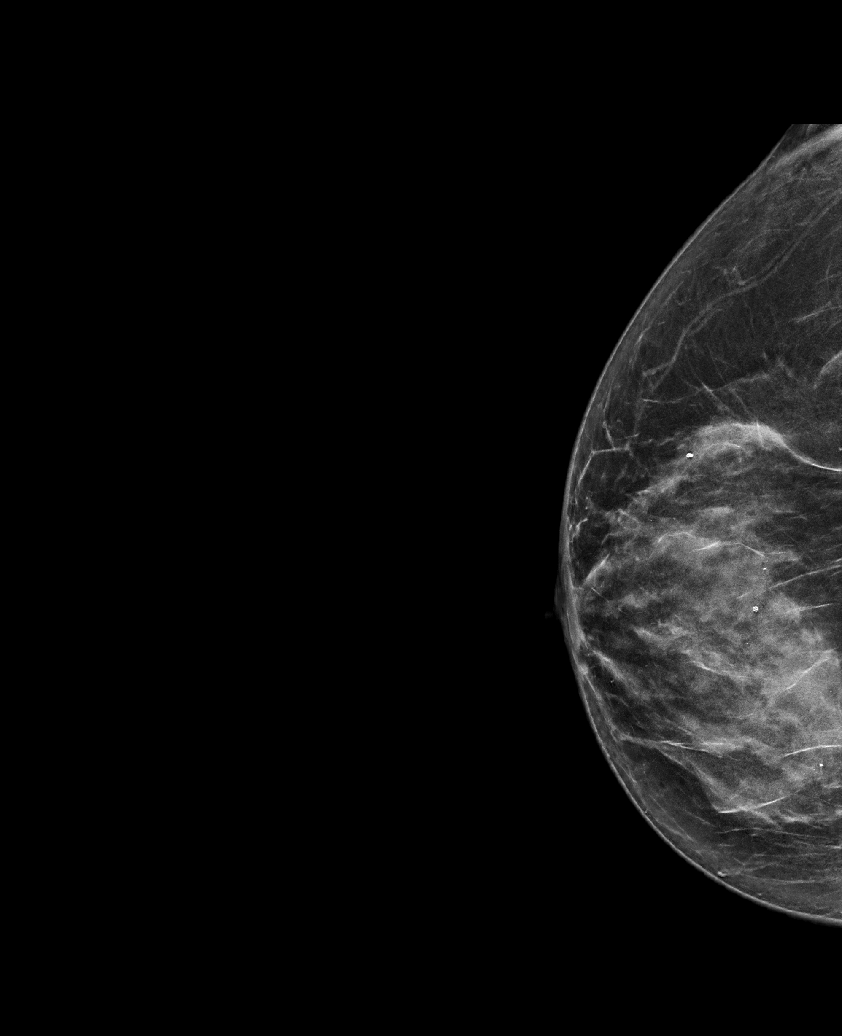

[L MLO synth-2D (2 of 2)]
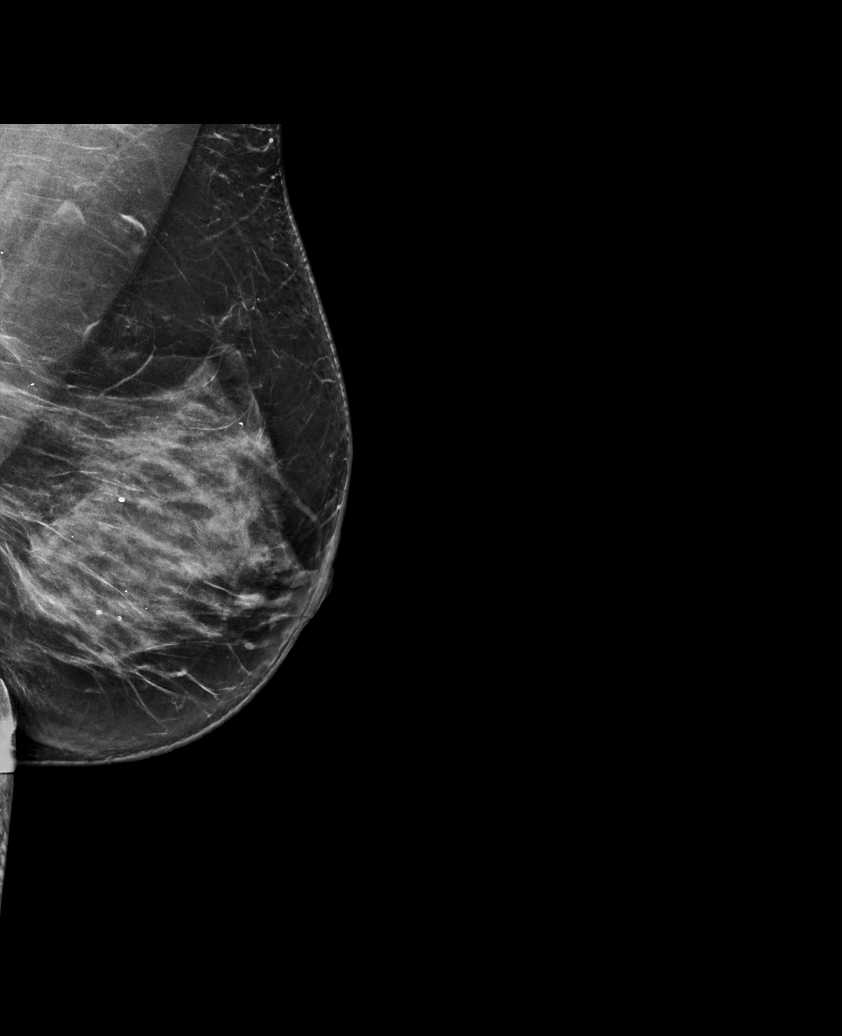

[R MLO synth-2D (2 of 2)]
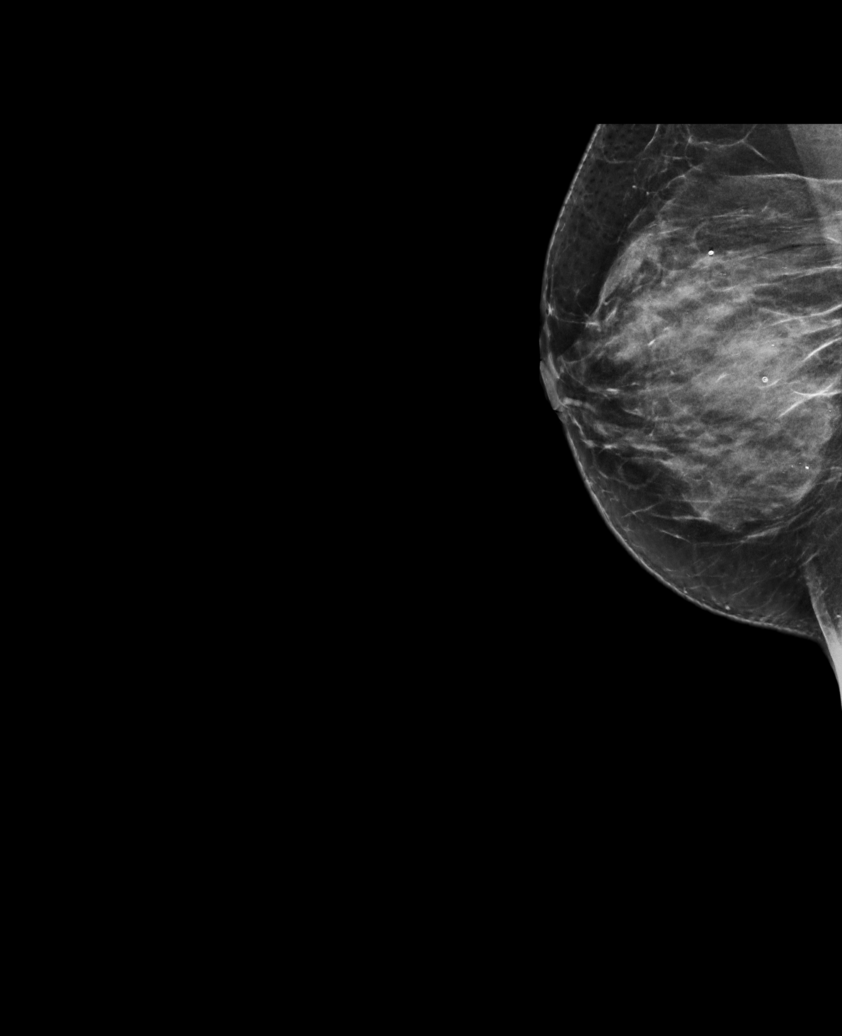

[6 of 36 positions shown; findings below may reference images not displayed]

ACR Breast Density Category c: The breast tissue is heterogeneously
dense, which may obscure small masses.
FINDINGS: There are no findings suspicious for malignancy. Images were
processed with CAD.
IMPRESSION: No mammographic evidence of malignancy. A result letter of this
screening mammogram will be mailed directly to the patient.

RECOMMENDATION:
Screening mammogram in one year. (Code:[id number])

BI-RADS CATEGORY  1: Negative.

## 2020-06-21 DIAGNOSIS — G479 Sleep disorder, unspecified: Secondary | ICD-10-CM | POA: Diagnosis not present

## 2020-06-21 DIAGNOSIS — I10 Essential (primary) hypertension: Secondary | ICD-10-CM | POA: Diagnosis not present

## 2020-07-07 ENCOUNTER — Ambulatory Visit (INDEPENDENT_AMBULATORY_CARE_PROVIDER_SITE_OTHER): Payer: BC Managed Care – PPO | Admitting: Psychology

## 2020-07-07 DIAGNOSIS — F411 Generalized anxiety disorder: Secondary | ICD-10-CM | POA: Diagnosis not present

## 2020-08-31 ENCOUNTER — Ambulatory Visit (INDEPENDENT_AMBULATORY_CARE_PROVIDER_SITE_OTHER): Payer: BC Managed Care – PPO | Admitting: Psychology

## 2020-08-31 DIAGNOSIS — F411 Generalized anxiety disorder: Secondary | ICD-10-CM | POA: Diagnosis not present

## 2020-09-02 ENCOUNTER — Encounter: Payer: Self-pay | Admitting: Gastroenterology

## 2020-10-11 ENCOUNTER — Ambulatory Visit (INDEPENDENT_AMBULATORY_CARE_PROVIDER_SITE_OTHER): Payer: BC Managed Care – PPO | Admitting: Psychology

## 2020-10-11 DIAGNOSIS — F411 Generalized anxiety disorder: Secondary | ICD-10-CM

## 2020-10-13 ENCOUNTER — Encounter: Payer: Self-pay | Admitting: Gastroenterology

## 2020-10-13 ENCOUNTER — Ambulatory Visit (AMBULATORY_SURGERY_CENTER): Payer: Self-pay

## 2020-10-13 ENCOUNTER — Other Ambulatory Visit: Payer: Self-pay

## 2020-10-13 VITALS — Ht 65.5 in | Wt 221.0 lb

## 2020-10-13 DIAGNOSIS — Z1211 Encounter for screening for malignant neoplasm of colon: Secondary | ICD-10-CM

## 2020-10-13 MED ORDER — SUTAB 1479-225-188 MG PO TABS: 1.0000 | ORAL_TABLET | ORAL | 0 refills | Status: DC

## 2020-10-13 NOTE — Progress Notes (Signed)
No egg or soy allergy known to patient  No issues with past sedation with any surgeries or procedures No intubation problems in the past  No FH of Malignant Hyperthermia No diet pills per patient No home 02 use per patient  No blood thinners per patient  Pt denies issues with constipation  No A fib or A flutter  COVID 19 guidelines implemented in PV today with Pt and RN  Coupon given to pt in PV today , Code to Pharmacy  COVID vaccines completed on 03/2020 per pt;  Due to the COVID-19 pandemic we are asking patients to follow these guidelines. Please only bring one care partner. Please be aware that your care partner may wait in the car in the parking lot or if they feel like they will be too hot to wait in the car, they may wait in the lobby on the 4th floor. All care partners are required to wear a mask the entire time (we do not have any that we can provide them), they need to practice social distancing, and we will do a Covid check for all patient's and care partners when you arrive. Also we will check their temperature and your temperature. If the care partner waits in their car they need to stay in the parking lot the entire time and we will call them on their cell phone when the patient is ready for discharge so they can bring the car to the front of the building. Also all patient's will need to wear a mask into building.

## 2020-10-26 ENCOUNTER — Ambulatory Visit (AMBULATORY_SURGERY_CENTER): Payer: BC Managed Care – PPO | Admitting: Gastroenterology

## 2020-10-26 ENCOUNTER — Other Ambulatory Visit: Payer: Self-pay

## 2020-10-26 ENCOUNTER — Encounter: Payer: Self-pay | Admitting: Gastroenterology

## 2020-10-26 VITALS — BP 121/67 | HR 66 | Temp 97.1°F | Resp 16 | Ht 65.5 in | Wt 221.0 lb

## 2020-10-26 DIAGNOSIS — Z1211 Encounter for screening for malignant neoplasm of colon: Secondary | ICD-10-CM | POA: Diagnosis not present

## 2020-10-26 DIAGNOSIS — K621 Rectal polyp: Secondary | ICD-10-CM

## 2020-10-26 DIAGNOSIS — D128 Benign neoplasm of rectum: Secondary | ICD-10-CM

## 2020-10-26 MED ORDER — SODIUM CHLORIDE 0.9 % IV SOLN: 500.0000 mL | Freq: Once | INTRAVENOUS | Status: DC

## 2020-10-26 NOTE — Patient Instructions (Signed)
Thank you for allowing [name] to care for you today!  Recommend next colonoscopy in 7-10 years, based on biopsy results.  We will notify you in 7-10 days by mail.  Resume previous diet and medications today.  Return to your normal activities tomorrow.   YOU HAD AN ENDOSCOPIC PROCEDURE TODAY AT [facility]:   Refer to the procedure report that was given to you for any specific questions about what was found during the examination.  If the procedure report does not answer your questions, please call your gastroenterologist to clarify.  If you requested that your care partner not be given the details of your procedure findings, then the procedure report has been included in a sealed envelope for you to review at your convenience later.  YOU SHOULD EXPECT: Some feelings of bloating in the abdomen. Passage of more gas than usual.  Walking can help get rid of the air that was put into your GI tract during the procedure and reduce the bloating. If you had a lower endoscopy (such as a colonoscopy or flexible sigmoidoscopy) you may notice spotting of blood in your stool or on the toilet paper. If you underwent a bowel prep for your procedure, you may not have a normal bowel movement for a few days.  Please Note:  You might notice some irritation and congestion in your nose or some drainage.  This is from the oxygen used during your procedure.  There is no need for concern and it should clear up in a day or so.  SYMPTOMS TO REPORT IMMEDIATELY:   Following lower endoscopy (colonoscopy or flexible sigmoidoscopy):  Excessive amounts of blood in the stool  Significant tenderness or worsening of abdominal pains  Swelling of the abdomen that is new, acute  Fever of 100F or higher     For urgent or emergent issues, a gastroenterologist can be reached at any hour by calling [phone]. Do not use MyChart messaging for urgent concerns.    DIET:  We do recommend a small meal at first, but  then you may proceed to your regular diet.  Drink plenty of fluids but you should avoid alcoholic beverages for 24 hours.  ACTIVITY:  You should plan to take it easy for the rest of today and you should NOT DRIVE or use heavy machinery until tomorrow (because of the sedation medicines used during the test).    FOLLOW UP: Our staff will call the number listed on your records 48-72 hours following your procedure to check on you and address any questions or concerns that you may have regarding the information given to you following your procedure. If we do not reach you, we will leave a message.  We will attempt to reach you two times.  During this call, we will ask if you have developed any symptoms of COVID 19. If you develop any symptoms (ie: fever, flu-like symptoms, shortness of breath, cough etc.) before then, please call [phone].  If you test positive for Covid 19 in the 2 weeks post procedure, please call and report this information to [name].    If any biopsies were taken you will be contacted by phone or by letter within the next 1-3 weeks.  Please call us at [phone] if you have not heard about the biopsies in 3 weeks.    SIGNATURES/CONFIDENTIALITY: You and/or your care partner have signed paperwork which will be entered into your electronic medical record.  These signatures attest to the fact that  that the information above on your After Visit Summary has been reviewed and is understood.  Full responsibility of the confidentiality of this discharge information lies with you and/or your care-partner.

## 2020-10-26 NOTE — Progress Notes (Signed)
Called to room to assist during endoscopic procedure.  Patient ID and intended procedure confirmed with present staff. Received instructions for my participation in the procedure from the performing physician.  

## 2020-10-26 NOTE — Progress Notes (Signed)
Report to PACU, RN, vss, BBS= Clear.  

## 2020-10-26 NOTE — Progress Notes (Signed)
Vitals-CW  Pt's states no medical or surgical changes since previsit or office visit. 

## 2020-10-26 NOTE — Op Note (Signed)
[place] Patient Name: Cheryl Morton Procedure Date: 10/26/2020 9:24 AM MRN: [id number] Endoscopist: Mauri Pole , MD Age: 64 Referring MD:  Date of Birth: September 23, 1956 Gender: Female Account #: [id number] Procedure:                Colonoscopy Indications:              Screening for colorectal malignant neoplasm Medicines:                Monitored Anesthesia Care Procedure:                Pre-Anesthesia Assessment:                           - Prior to the procedure, a History and Physical                            was performed, and patient medications and                            allergies were reviewed. The patient's tolerance of                            previous anesthesia was also reviewed. The risks                            and benefits of the procedure and the sedation                            options and risks were discussed with the patient.                            All questions were answered, and informed consent                            was obtained. Prior Anticoagulants: The patient has                            taken no previous anticoagulant or antiplatelet                            agents. ASA Grade Assessment: II - A patient with                            mild systemic disease. After reviewing the risks                            and benefits, the patient was deemed in                            satisfactory condition to undergo the procedure.                           After obtaining informed consent, the colonoscope  was passed under direct vision. Throughout the                            procedure, the patient's blood pressure, pulse, and                            oxygen saturations were monitored continuously. The                            Colonoscope was introduced through the anus and                            advanced to the the cecum, identified by                            appendiceal orifice and  ileocecal valve. The                            colonoscopy was performed without difficulty. The                            patient tolerated the procedure well. The quality                            of the bowel preparation was good. The ileocecal                            valve, appendiceal orifice, and rectum were                            photographed. Scope In: 9:42:10 AM Scope Out: 9:59:54 AM Scope Withdrawal Time: 0 hours 11 minutes 26 seconds  Total Procedure Duration: 0 hours 17 minutes 44 seconds  Findings:                 The perianal and digital rectal examinations were                            normal.                           A less than 1 mm polyp was found in the rectum. The                            polyp was sessile. The polyp was removed with a                            cold biopsy forceps. Resection and retrieval were                            complete.                           A few small-mouthed diverticula were found in the  sigmoid colon.                           Non-bleeding internal hemorrhoids were found during                            retroflexion. The hemorrhoids were small. Complications:            No immediate complications. Estimated Blood Loss:     Estimated blood loss was minimal. Impression:               - One less than 1 mm polyp in the rectum, removed                            with a cold biopsy forceps. Resected and retrieved.                           - Diverticulosis in the sigmoid colon.                           - Non-bleeding internal hemorrhoids. Recommendation:           - Patient has a contact number available for                            emergencies. The signs and symptoms of potential                            delayed complications were discussed with the                            patient. Return to normal activities tomorrow.                            Written discharge instructions were  provided to the                            patient.                           - Resume previous diet.                           - Continue present medications.                           - Await pathology results.                           - Repeat colonoscopy in 7-10 years for surveillance                            based on pathology results. Mauri Pole, MD 10/26/2020 10:08:23 AM This report has been signed electronically.

## 2020-10-28 ENCOUNTER — Telehealth: Payer: Self-pay

## 2020-10-28 NOTE — Telephone Encounter (Signed)
Covid-19 screening questions   Do you now or have you had a fever in the last 14 days? No.  Do you have any respiratory symptoms of shortness of breath or cough now or in the last 14 days? No. Do you have any family members or close contacts with diagnosed or suspected Covid-19 in the past 14 days? No.  Have you been tested for Covid-19 and found to be positive? No.      Follow up Call-  Call back number 10/26/2020  Post procedure Call Back phone  # [phone]  Permission to leave phone message Yes  Some recent data might be hidden     Patient questions:  Do you have a fever, pain , or abdominal swelling? No. Pain Score  0 *  Have you tolerated food without any problems? Yes.    Have you been able to return to your normal activities? Yes.    Do you have any questions about your discharge instructions: Diet   No. Medications  No. Follow up visit  No.  Do you have questions or concerns about your Care? No.  Actions: * If pain score is 4 or above: No action needed, pain <4.

## 2020-11-07 ENCOUNTER — Encounter: Payer: Self-pay | Admitting: Gastroenterology

## 2020-11-22 ENCOUNTER — Ambulatory Visit: Payer: BC Managed Care – PPO | Admitting: Psychology

## 2020-11-25 DIAGNOSIS — E1165 Type 2 diabetes mellitus with hyperglycemia: Secondary | ICD-10-CM | POA: Diagnosis not present

## 2020-11-25 DIAGNOSIS — G4730 Sleep apnea, unspecified: Secondary | ICD-10-CM | POA: Diagnosis not present

## 2020-11-25 DIAGNOSIS — F411 Generalized anxiety disorder: Secondary | ICD-10-CM | POA: Diagnosis not present

## 2020-11-25 DIAGNOSIS — I10 Essential (primary) hypertension: Secondary | ICD-10-CM | POA: Diagnosis not present

## 2020-11-25 DIAGNOSIS — Z1331 Encounter for screening for depression: Secondary | ICD-10-CM | POA: Diagnosis not present

## 2020-11-30 DIAGNOSIS — I10 Essential (primary) hypertension: Secondary | ICD-10-CM | POA: Diagnosis not present

## 2020-11-30 DIAGNOSIS — G479 Sleep disorder, unspecified: Secondary | ICD-10-CM | POA: Diagnosis not present

## 2020-12-13 ENCOUNTER — Other Ambulatory Visit: Payer: BC Managed Care – PPO

## 2020-12-13 DIAGNOSIS — Z20822 Contact with and (suspected) exposure to covid-19: Secondary | ICD-10-CM | POA: Diagnosis not present

## 2020-12-14 LAB — NOVEL CORONAVIRUS, NAA: SARS-CoV-2, NAA: DETECTED — AB

## 2020-12-14 LAB — SARS-COV-2, NAA 2 DAY TAT

## 2020-12-15 ENCOUNTER — Ambulatory Visit: Payer: Self-pay | Admitting: *Deleted

## 2020-12-15 NOTE — Telephone Encounter (Signed)
Pt called in returning a call made to her yesterday letting her know her covid 19 test result came back positive.   All the quarantine information, etc was entered into her MyChart account.  "I will go in and read it".  I did go over quarantine and precautions with her while I had her on the phone.   She lives alone but she has called everyone she has been in contact with over the last few days and let them know she is positive for the virus. I answered her questions.  I went over the care advice with her.  I let her know the health dept. May contact her also.  Reason for Disposition . [1] VELFY-10 diagnosed by positive lab test (e.g., PCR, rapid self-test kit) AND [2] mild symptoms (e.g., cough, fever, others) AND [1] no complications or SOB  Answer Assessment - Initial Assessment Questions 1. COVID-19 DIAGNOSIS: "Who made your COVID-19 diagnosis?" "Was it confirmed by a positive lab test?" If not diagnosed by a HCP, ask "Are there lots of cases (community spread) where you live?" Note: See public health department website, if unsure.     Your test result has come back positive for covid 19. 2. COVID-19 EXPOSURE: "Was there any known exposure to COVID before the symptoms began?" CDC Definition of close contact: within 6 feet (2 meters) for a total of 15 minutes or more over a 24-hour period.      "I feel like I have a head cold that's all"  I went over the quarantine protocol of 10 days and that she could end quarantine after 10 days along as she is fever free for 24 hrs and her symptoms are resolving. 3. ONSET: "When did the COVID-19 symptoms start?"      *No Answer* 4. WORST SYMPTOM: "What is your worst symptom?" (e.g., cough, fever, shortness of breath, muscle aches)     *No Answer* 5. COUGH: "Do you have a cough?" If Yes, ask: "How bad is the cough?"       *No Answer* 6. FEVER: "Do you have a fever?" If Yes, ask: "What is your temperature, how was it measured, and when did it start?"      *No Answer* 7. RESPIRATORY STATUS: "Describe your breathing?" (e.g., shortness of breath, wheezing, unable to speak)      *No Answer* 8. BETTER-SAME-WORSE: "Are you getting better, staying the same or getting worse compared to yesterday?"  If getting worse, ask, "In what way?"     *No Answer* 9. HIGH RISK DISEASE: "Do you have any chronic medical problems?" (e.g., asthma, heart or lung disease, weak immune system, obesity, etc.)     *No Answer* 10. VACCINE: "Have you gotten the COVID-19 vaccine?" If Yes ask: "Which one, how many shots, when did you get it?"       *No Answer* 11. PREGNANCY: "Is there any chance you are pregnant?" "When was your last menstrual period?"       *No Answer* 12. OTHER SYMPTOMS: "Do you have any other symptoms?"  (e.g., chills, fatigue, headache, loss of smell or taste, muscle pain, sore throat; new loss of smell or taste especially support the diagnosis of COVID-19)       Just a head cold, runny nose, sore throat.  Protocols used: CORONAVIRUS (COVID-19) DIAGNOSED OR SUSPECTED-A-AH

## 2020-12-21 DIAGNOSIS — U071 COVID-19: Secondary | ICD-10-CM | POA: Diagnosis not present

## 2021-04-20 DIAGNOSIS — H401121 Primary open-angle glaucoma, left eye, mild stage: Secondary | ICD-10-CM | POA: Diagnosis not present

## 2021-05-25 DIAGNOSIS — Z20822 Contact with and (suspected) exposure to covid-19: Secondary | ICD-10-CM | POA: Diagnosis not present

## 2021-08-16 DIAGNOSIS — R7989 Other specified abnormal findings of blood chemistry: Secondary | ICD-10-CM | POA: Diagnosis not present

## 2021-08-16 DIAGNOSIS — E118 Type 2 diabetes mellitus with unspecified complications: Secondary | ICD-10-CM | POA: Diagnosis not present

## 2021-08-16 DIAGNOSIS — F419 Anxiety disorder, unspecified: Secondary | ICD-10-CM | POA: Diagnosis not present

## 2021-08-16 DIAGNOSIS — I10 Essential (primary) hypertension: Secondary | ICD-10-CM | POA: Diagnosis not present

## 2021-11-01 DIAGNOSIS — H401121 Primary open-angle glaucoma, left eye, mild stage: Secondary | ICD-10-CM | POA: Diagnosis not present

## 2021-11-06 DIAGNOSIS — F418 Other specified anxiety disorders: Secondary | ICD-10-CM | POA: Diagnosis not present

## 2021-11-06 DIAGNOSIS — E559 Vitamin D deficiency, unspecified: Secondary | ICD-10-CM | POA: Diagnosis not present

## 2021-11-06 DIAGNOSIS — I10 Essential (primary) hypertension: Secondary | ICD-10-CM | POA: Diagnosis not present

## 2021-11-06 DIAGNOSIS — E1169 Type 2 diabetes mellitus with other specified complication: Secondary | ICD-10-CM | POA: Diagnosis not present

## 2021-11-06 DIAGNOSIS — J302 Other seasonal allergic rhinitis: Secondary | ICD-10-CM | POA: Diagnosis not present

## 2021-11-27 DIAGNOSIS — S83411A Sprain of medial collateral ligament of right knee, initial encounter: Secondary | ICD-10-CM | POA: Diagnosis not present

## 2022-01-10 DIAGNOSIS — Z23 Encounter for immunization: Secondary | ICD-10-CM | POA: Diagnosis not present

## 2022-01-10 DIAGNOSIS — Z0000 Encounter for general adult medical examination without abnormal findings: Secondary | ICD-10-CM | POA: Diagnosis not present

## 2022-06-11 DIAGNOSIS — U071 COVID-19: Secondary | ICD-10-CM | POA: Diagnosis not present

## 2022-06-11 DIAGNOSIS — R0989 Other specified symptoms and signs involving the circulatory and respiratory systems: Secondary | ICD-10-CM | POA: Diagnosis not present

## 2022-06-11 DIAGNOSIS — E1169 Type 2 diabetes mellitus with other specified complication: Secondary | ICD-10-CM | POA: Diagnosis not present

## 2022-06-11 DIAGNOSIS — Z7984 Long term (current) use of oral hypoglycemic drugs: Secondary | ICD-10-CM | POA: Diagnosis not present

## 2022-06-11 DIAGNOSIS — F418 Other specified anxiety disorders: Secondary | ICD-10-CM | POA: Diagnosis not present

## 2022-07-11 DIAGNOSIS — G4733 Obstructive sleep apnea (adult) (pediatric): Secondary | ICD-10-CM | POA: Diagnosis not present

## 2022-08-10 DIAGNOSIS — G4733 Obstructive sleep apnea (adult) (pediatric): Secondary | ICD-10-CM | POA: Diagnosis not present

## 2022-09-06 DIAGNOSIS — H401121 Primary open-angle glaucoma, left eye, mild stage: Secondary | ICD-10-CM | POA: Diagnosis not present

## 2022-09-10 DIAGNOSIS — G4733 Obstructive sleep apnea (adult) (pediatric): Secondary | ICD-10-CM | POA: Diagnosis not present

## 2022-09-11 DIAGNOSIS — I10 Essential (primary) hypertension: Secondary | ICD-10-CM | POA: Diagnosis not present

## 2022-09-11 DIAGNOSIS — F418 Other specified anxiety disorders: Secondary | ICD-10-CM | POA: Diagnosis not present

## 2022-09-11 DIAGNOSIS — E1169 Type 2 diabetes mellitus with other specified complication: Secondary | ICD-10-CM | POA: Diagnosis not present

## 2022-09-11 DIAGNOSIS — Z23 Encounter for immunization: Secondary | ICD-10-CM | POA: Diagnosis not present

## 2022-09-11 DIAGNOSIS — E7800 Pure hypercholesterolemia, unspecified: Secondary | ICD-10-CM | POA: Diagnosis not present

## 2022-10-10 DIAGNOSIS — G4733 Obstructive sleep apnea (adult) (pediatric): Secondary | ICD-10-CM | POA: Diagnosis not present

## 2022-10-22 DIAGNOSIS — G4733 Obstructive sleep apnea (adult) (pediatric): Secondary | ICD-10-CM | POA: Diagnosis not present

## 2022-11-10 DIAGNOSIS — G4733 Obstructive sleep apnea (adult) (pediatric): Secondary | ICD-10-CM | POA: Diagnosis not present

## 2022-12-10 DIAGNOSIS — G4733 Obstructive sleep apnea (adult) (pediatric): Secondary | ICD-10-CM | POA: Diagnosis not present

## 2024-01-07 DIAGNOSIS — H401121 Primary open-angle glaucoma, left eye, mild stage: Secondary | ICD-10-CM | POA: Diagnosis not present

## 2024-01-07 DIAGNOSIS — H2513 Age-related nuclear cataract, bilateral: Secondary | ICD-10-CM | POA: Diagnosis not present

## 2024-01-20 DIAGNOSIS — G4733 Obstructive sleep apnea (adult) (pediatric): Secondary | ICD-10-CM | POA: Diagnosis not present

## 2024-01-20 DIAGNOSIS — I10 Essential (primary) hypertension: Secondary | ICD-10-CM | POA: Diagnosis not present

## 2024-01-20 DIAGNOSIS — K219 Gastro-esophageal reflux disease without esophagitis: Secondary | ICD-10-CM | POA: Diagnosis not present

## 2024-01-20 DIAGNOSIS — E1169 Type 2 diabetes mellitus with other specified complication: Secondary | ICD-10-CM | POA: Diagnosis not present

## 2024-01-20 DIAGNOSIS — E7800 Pure hypercholesterolemia, unspecified: Secondary | ICD-10-CM | POA: Diagnosis not present

## 2024-01-20 DIAGNOSIS — Z0000 Encounter for general adult medical examination without abnormal findings: Secondary | ICD-10-CM | POA: Diagnosis not present

## 2024-01-20 DIAGNOSIS — E559 Vitamin D deficiency, unspecified: Secondary | ICD-10-CM | POA: Diagnosis not present

## 2024-01-20 DIAGNOSIS — Z13820 Encounter for screening for osteoporosis: Secondary | ICD-10-CM | POA: Diagnosis not present

## 2024-01-20 DIAGNOSIS — Z1159 Encounter for screening for other viral diseases: Secondary | ICD-10-CM | POA: Diagnosis not present

## 2024-01-21 ENCOUNTER — Other Ambulatory Visit: Payer: Self-pay | Admitting: Internal Medicine

## 2024-01-21 DIAGNOSIS — H40051 Ocular hypertension, right eye: Secondary | ICD-10-CM | POA: Diagnosis not present

## 2024-01-21 DIAGNOSIS — E2839 Other primary ovarian failure: Secondary | ICD-10-CM

## 2024-01-21 DIAGNOSIS — H25812 Combined forms of age-related cataract, left eye: Secondary | ICD-10-CM | POA: Diagnosis not present

## 2024-01-21 DIAGNOSIS — H401121 Primary open-angle glaucoma, left eye, mild stage: Secondary | ICD-10-CM | POA: Diagnosis not present

## 2024-01-21 DIAGNOSIS — H25811 Combined forms of age-related cataract, right eye: Secondary | ICD-10-CM | POA: Diagnosis not present

## 2024-02-07 DIAGNOSIS — Z01818 Encounter for other preprocedural examination: Secondary | ICD-10-CM | POA: Diagnosis not present

## 2024-02-07 DIAGNOSIS — H25812 Combined forms of age-related cataract, left eye: Secondary | ICD-10-CM | POA: Diagnosis not present

## 2024-02-07 DIAGNOSIS — H401121 Primary open-angle glaucoma, left eye, mild stage: Secondary | ICD-10-CM | POA: Diagnosis not present

## 2024-02-24 DIAGNOSIS — H401121 Primary open-angle glaucoma, left eye, mild stage: Secondary | ICD-10-CM | POA: Diagnosis not present

## 2024-02-24 DIAGNOSIS — E1139 Type 2 diabetes mellitus with other diabetic ophthalmic complication: Secondary | ICD-10-CM | POA: Diagnosis not present

## 2024-02-24 DIAGNOSIS — E1136 Type 2 diabetes mellitus with diabetic cataract: Secondary | ICD-10-CM | POA: Diagnosis not present

## 2024-02-24 DIAGNOSIS — H25812 Combined forms of age-related cataract, left eye: Secondary | ICD-10-CM | POA: Diagnosis not present

## 2024-03-05 DIAGNOSIS — E1165 Type 2 diabetes mellitus with hyperglycemia: Secondary | ICD-10-CM | POA: Diagnosis not present

## 2024-03-05 DIAGNOSIS — Z0000 Encounter for general adult medical examination without abnormal findings: Secondary | ICD-10-CM | POA: Diagnosis not present

## 2024-03-05 DIAGNOSIS — I10 Essential (primary) hypertension: Secondary | ICD-10-CM | POA: Diagnosis not present

## 2024-03-05 DIAGNOSIS — E785 Hyperlipidemia, unspecified: Secondary | ICD-10-CM | POA: Diagnosis not present

## 2024-03-05 DIAGNOSIS — K219 Gastro-esophageal reflux disease without esophagitis: Secondary | ICD-10-CM | POA: Diagnosis not present

## 2024-03-16 DIAGNOSIS — I10 Essential (primary) hypertension: Secondary | ICD-10-CM | POA: Diagnosis not present

## 2024-03-16 DIAGNOSIS — E1136 Type 2 diabetes mellitus with diabetic cataract: Secondary | ICD-10-CM | POA: Diagnosis not present

## 2024-03-16 DIAGNOSIS — H25811 Combined forms of age-related cataract, right eye: Secondary | ICD-10-CM | POA: Diagnosis not present

## 2024-03-16 DIAGNOSIS — H40051 Ocular hypertension, right eye: Secondary | ICD-10-CM | POA: Diagnosis not present

## 2024-07-10 ENCOUNTER — Encounter: Payer: Self-pay | Admitting: Advanced Practice Midwife

## 2024-09-22 ENCOUNTER — Other Ambulatory Visit: Payer: BC Managed Care – PPO

## 2024-09-29 ENCOUNTER — Other Ambulatory Visit (HOSPITAL_BASED_OUTPATIENT_CLINIC_OR_DEPARTMENT_OTHER)

## 2024-10-06 ENCOUNTER — Ambulatory Visit (HOSPITAL_BASED_OUTPATIENT_CLINIC_OR_DEPARTMENT_OTHER)
Admission: RE | Admit: 2024-10-06 | Discharge: 2024-10-06 | Disposition: A | Discharge status: Home / Self Care | Source: Ambulatory Visit | Attending: Internal Medicine | Admitting: Internal Medicine

## 2024-10-06 DIAGNOSIS — E2839 Other primary ovarian failure: Secondary | ICD-10-CM | POA: Diagnosis present

## 2024-11-09 NOTE — Therapy (Signed)
 OUTPATIENT PHYSICAL THERAPY FEMALE PELVIC EVALUATION   Patient Name: Cheryl Morton MRN: [id number] DOB:13-Mar-1956, 68 y.o., female Today's Date: 11/10/2024  END OF SESSION:  PT End of Session - 11/10/24 1730     Visit Number 1    Date for Recertification  02/10/25    Authorization Type [employer] Medicare    Authorization Time Period waiting for auth    Progress Note Due on Visit 10    PT Start Time 1400    PT Stop Time 1445    PT Time Calculation (min) 45 min    Activity Tolerance Patient tolerated treatment well;No increased pain    Behavior During Therapy WFL for tasks assessed/performed          Past Medical History:  Diagnosis Date   Allergy    seasonal allergies   Anxiety    has PRN meds   Depression    on meds   Diabetes mellitus without complication (HCC)    on meds   GERD (gastroesophageal reflux disease)    on meds   Glaucoma    on meds   Hyperlipidemia    on meds   Hypertension    on meds   Past Surgical History:  Procedure Laterality Date   ABDOMINAL HYSTERECTOMY     Fibroids/DUB.  Ovaries intact.   WISDOM TOOTH EXTRACTION     Patient Active Problem List   Diagnosis Date Noted   Former smoker 11/10/2018   Seasonal allergic rhinitis due to pollen 09/20/2017   Pure hypercholesterolemia 03/12/2017   Anxiety and depression 07/22/2016   Class 1 obesity due to excess calories with serious comorbidity and body mass index (BMI) of 34.0 to 34.9 in adult 08/18/2013   Diabetes mellitus (HCC) 09/24/2012   GERD 06/27/2010   ESSENTIAL HYPERTENSION, BENIGN 07/25/2009    PCP: Bettie Santana LABOR, MD  REFERRING PROVIDER: Vernon Velna SAUNDERS, MD  REFERRING DIAG: N39.41 (ICD-10-CM) - Urgency incontinence   THERAPY DIAG:  Cramp and spasm  Rationale for Evaluation and Treatment: Rehabilitation  ONSET DATE: 2024  SUBJECTIVE:                                                                                                                                                                                            SUBJECTIVE STATEMENT: Patient reports that since last year she cannot make it to the bathroom, she will start dripping. She has to pee frequently, every hour or so.  Hx of hysterectomy and painful fibroids.   Fluid intake: her mouth is dry because of C pap machine, drinks a lot- colored water-0 sugar drinks   FUNCTIONAL LIMITATIONS: frequent  urination, leaks  PERTINENT HISTORY:  Medications for current condition: no Surgeries: - Other: - Sexual abuse: No  PAIN:  Are you having pain? No  PRECAUTIONS: None  RED FLAGS: None   WEIGHT BEARING RESTRICTIONS: No  FALLS:  Has patient fallen in last 6 months? No  OCCUPATION: retired  ACTIVITY LEVEL : very active, gym 2-3 times/ week, travels a lot  PLOF: Independent  PATIENT GOALS: lose about 30 lbs, less stops at the bathroom, less frequency   BOWEL MOVEMENT:no issues URINATION: Pain with urination: No Fully empty bladder: No                                         Post-void dribble: Yes  Stream: Strong and Weak Urgency: No Frequency:during the day yes                                                        Nocturia: Yes: 2   Leakage: Urge to void, Walking to the bathroom, Coughing, and Sneezing Pads/briefs: Yes: 1  INTERCOURSE: not active   PREGNANCY: miscarriage 1 time   PROLAPSE: None   OBJECTIVE:  Note: Objective measures were completed at Evaluation unless otherwise noted.  DIAGNOSTIC FINDINGS:  Post-void residual: Voiding Cystourethrogram (VCUG):  Ultrasound:   PATIENT SURVEYS:    PFIQ-7: 67 UIQ-7 67 CRAIG -7 0 POPIQ-7 0   COGNITION: Overall cognitive status: Within functional limits for tasks assessed     SENSATION: Light touch: Appears intact  LUMBAR SPECIAL TESTS:  Slump test: Negative  FUNCTIONAL TESTS:   Single leg stance: trunks sway, decreased balance bilat  Rt:  Lt: Sit-up test: domes abdomen and holds breaths Squat: Bed  mobility:WFL  GAIT: Assistive device utilized: None Comments: WFL  POSTURE: rounded shoulders, forward head, and increased lumbar lordosis   LUMBARAROM/PROM: stiffness throughout  A/PROM A/PROM  Eval (% available)  Flexion 60  Extension   Right lateral flexion 60  Left lateral flexion 60  Right rotation 60  Left rotation 60   (Blank rows = not tested)  LOWER EXTREMITY ROM: mild stiffness throughout  LOWER EXTREMITY MMT: bilateral hips 4/5  PALPATION:  General:   Pelvic Alignment: even  Abdominal:   Diastasis: Yes: 2 fingers Distortion: Yes  doming Breathing: upper chest Scar tissue: Yes: hysterectomy Active Straight Leg Raise:                 External Perineal Exam: mild dryness                             Internal Pelvic Floor: patient tends to hold tight, has difficulty with lengthening, tight and tender bulbocavernosus  Patient confirms identification and approves PT to assess internal pelvic floor and treatment Yes All internal or external pelvic floor assessments and/or treatments are completed with proper hand hygiene and gloves hands. If needed gloves are changed with hand hygiene during patient care time.  PELVIC MMT:   MMT eval  Vaginal 4/5  Internal Anal Sphincter   External Anal Sphincter   Puborectalis   (Blank rows = not tested)        TONE: high  PROLAPSE: None noticed in hooklying  TODAY'S  TREATMENT:                                                                                                                              DATE: 11/10/2024  EVAL  Examination completed, findings reviewed, pt educated on POC, HEP, and female pelvic floor anatomy, reasoning with pelvic floor assessment internally with pt consent. Pt motivated to participate in PT and agreeable to attempt recommendations.     PATIENT EDUCATION:  Education details: Pt was educated on relevant anatomy, exam findings, home exercise program, plan of care, expectations of PT  and urge suppression techniques  Person educated: Patient Education method: Explanation, Demonstration, Tactile cues, Verbal cues, and Handouts Education comprehension: verbalized understanding, returned demonstration, verbal cues required, tactile cues required, and needs further education  HOME EXERCISE PROGRAM: Access Code: [email or web address] URL: [email or web address] Date: 11/10/2024 Prepared by: Cori Javione Gunawan  Patient Education - Urinary Urge Control Techniques - Urinary Urge Control Techniques - Lifestyle Changes to Support Urinary and Bladder Health - Lifestyle Changes that Support Urinary Health  ASSESSMENT:  CLINICAL IMPRESSION: Patient is a 68 y.o. F who was seen today for physical therapy evaluation and treatment for urge incontinence. Patient with signs of mixed incontinence. Patient reports that symptoms are affecting her life a lot. Patient reports being frustrated , always looking for a bathroom. Exam findings are notable for upper chest breathing strategies, abdominal diastasis with doming, pelvic floor muscle high tone,  tone in pelvic floor. External soft tissues of pelvic floor appear fine. Patient demonstrates decreased trunk mobility, bilateral hip weakness, trigger points in pelvic floor muscles, reduced range or motion in lumbar spine and tenderness with palpation in bilateral bulbocavernosus. It is difficult for patient to travel and be active due to urinary frequency and leaking . Discussed findings with patient, educated patient on urge suppression and HEP was initiated. Patient's quality of life has been affected, patient is frustrated and will benefit from physical therapy to address deficits, reduce urinary incontinence and improve urinary frequency and quality of life.  .   OBJECTIVE IMPAIRMENTS: decreased coordination, decreased ROM, decreased strength, increased fascial restrictions, increased muscle spasms, impaired tone, and obesity.   ACTIVITY  LIMITATIONS: continence and toileting  PARTICIPATION LIMITATIONS: driving, shopping, community activity, and yard work  PERSONAL FACTORS: Time since onset of injury/illness/exacerbation are also affecting patient's functional outcome.   REHAB POTENTIAL: Good  CLINICAL DECISION MAKING: Evolving/moderate complexity  EVALUATION COMPLEXITY: Moderate   GOALS: Goals reviewed with patient? Yes  SHORT TERM GOALS: Target date: 12/08/2024    Pt will be independent with HEP.   Baseline: Goal status: INITIAL  2.  Patient will report 2-3 hours between voids Baseline:  Goal status: INITIAL  3.  Patient will teach back urge suppression techniques Baseline:  Goal status: INITIAL  4.  Patient will be I with vulvovaginal massage to reduce pelvic floor tone Baseline:  Goal status: INITIAL   LONG TERM GOALS: Target date: 02/10/2025   Pt will  be I and consistent with her HEP and demonstrate all exercises correctly  Baseline:  Goal status: INITIAL  2.  Pt will demonstrate 20 point improvement in PFIQ-7 score in order to show functional improvement in urinary incontinence.   Baseline: 67 Goal status: INITIAL  3.  Pt will soak 0 pads/ day in order to run errands and not have to interrupt daily tasks.  Baseline: 1 Goal status: INITIAL  4.  Patient will dem improved PROM/ AROM pelvic floor Baseline:  Goal status: INITIAL    PLAN:  PT FREQUENCY: 1-2x/week  PT DURATION: 12 weeks  PLANNED INTERVENTIONS: 97110-Therapeutic exercises, 97530- Therapeutic activity, 97112- Neuromuscular re-education, 97535- Self Care, 02859- Manual therapy, [phone]- Electrical stimulation (manual), [phone]- Ionotophoresis 4mg /ml Dexamethasone, 20560 (1-2 muscles), 20561 (3+ muscles)- Dry Needling, Patient/Family education, Taping, Joint mobilization, Joint manipulation, Spinal manipulation, Spinal mobilization, Manual lymph drainage, Scar mobilization, Cryotherapy, Moist heat, and Biofeedback  PLAN FOR NEXT  SESSION: diaphragmatic breathing, stretches for down training, TTNS, urge suppression, bladder diary, scar massage, 2 week water only bladder reset for HW   Oluwatomiwa Kinyon, PT 11/10/2024, 5:32 PM

## 2024-11-10 ENCOUNTER — Ambulatory Visit: Attending: Internal Medicine | Admitting: Physical Therapy

## 2024-11-10 ENCOUNTER — Encounter: Payer: Self-pay | Admitting: Physical Therapy

## 2024-11-10 ENCOUNTER — Other Ambulatory Visit: Payer: Self-pay

## 2024-11-10 DIAGNOSIS — R252 Cramp and spasm: Secondary | ICD-10-CM | POA: Diagnosis present

## 2024-11-23 ENCOUNTER — Ambulatory Visit: Payer: Self-pay | Admitting: Physical Therapy

## 2024-11-30 ENCOUNTER — Encounter: Payer: Self-pay | Admitting: Physical Therapy

## 2024-11-30 ENCOUNTER — Ambulatory Visit: Payer: Self-pay | Attending: Internal Medicine | Admitting: Physical Therapy

## 2024-11-30 DIAGNOSIS — R252 Cramp and spasm: Secondary | ICD-10-CM | POA: Insufficient documentation

## 2024-11-30 NOTE — Therapy (Signed)
 OUTPATIENT PHYSICAL THERAPY FEMALE PELVIC TREATMENT   Patient Name: Cheryl Morton MRN: [id number] DOB:12/27/1955, 68 y.o., female Today's Date: 11/30/2024  END OF SESSION:  PT End of Session - 11/30/24 1236     Visit Number 2    Date for Recertification  02/10/25    Authorization Type [facility] Medicare    Authorization Time Period [employer] aprroved 12vl, 11/10/2024 - 01/19/2025, [id number]    Authorization - Visit Number 1    Authorization - Number of Visits 12    Progress Note Due on Visit 10    PT Start Time 1237    PT Stop Time 1320    PT Time Calculation (min) 43 min    Activity Tolerance Patient tolerated treatment well;No increased pain    Behavior During Therapy WFL for tasks assessed/performed          Past Medical History:  Diagnosis Date   Allergy    seasonal allergies   Anxiety    has PRN meds   Depression    on meds   Diabetes mellitus without complication (HCC)    on meds   GERD (gastroesophageal reflux disease)    on meds   Glaucoma    on meds   Hyperlipidemia    on meds   Hypertension    on meds   Past Surgical History:  Procedure Laterality Date   ABDOMINAL HYSTERECTOMY     Fibroids/DUB.  Ovaries intact.   WISDOM TOOTH EXTRACTION     Patient Active Problem List   Diagnosis Date Noted   Former smoker 11/10/2018   Seasonal allergic rhinitis due to pollen 09/20/2017   Pure hypercholesterolemia 03/12/2017   Anxiety and depression 07/22/2016   Class 1 obesity due to excess calories with serious comorbidity and body mass index (BMI) of 34.0 to 34.9 in adult 08/18/2013   Diabetes mellitus (HCC) 09/24/2012   GERD 06/27/2010   ESSENTIAL HYPERTENSION, BENIGN 07/25/2009    PCP: Bettie Santana LABOR, MD  REFERRING PROVIDER: Vernon Velna SAUNDERS, MD  REFERRING DIAG: N39.41 (ICD-10-CM) - Urgency incontinence   THERAPY DIAG:  Cramp and spasm  Rationale for Evaluation and Treatment: Rehabilitation  ONSET DATE: 2024  SUBJECTIVE:                                                                                                                                                                                            SUBJECTIVE STATEMENT: Patient reports that she she tried to get her exercises work.  One day she was able to hold her urine for 45 mins at home 30 mins or less is her norm  eval Patient reports that since last year she cannot make it to the bathroom, she will start dripping. She has to pee frequently, every hour or so.  Hx of hysterectomy and painful fibroids.   Fluid intake: her mouth is dry because of C pap machine, drinks a lot- colored water-0 sugar drinks   FUNCTIONAL LIMITATIONS: frequent urination, leaks  PERTINENT HISTORY:  Medications for current condition: no Surgeries: - Other: - Sexual abuse: No  PAIN:  Are you having pain? No  PRECAUTIONS: None  RED FLAGS: None   WEIGHT BEARING RESTRICTIONS: No  FALLS:  Has patient fallen in last 6 months? No  OCCUPATION: retired  ACTIVITY LEVEL : very active, gym 2-3 times/ week, travels a lot  PLOF: Independent  PATIENT GOALS: lose about 30 lbs, less stops at the bathroom, less frequency   BOWEL MOVEMENT:no issues URINATION: Pain with urination: No Fully empty bladder: No                                         Post-void dribble: Yes  Stream: Strong and Weak Urgency: No Frequency:during the day yes                                                        Nocturia: Yes: 2   Leakage: Urge to void, Walking to the bathroom, Coughing, and Sneezing Pads/briefs: Yes: 1  INTERCOURSE: not active   PREGNANCY: miscarriage 1 time   PROLAPSE: None   OBJECTIVE:  Note: Objective measures were completed at Evaluation unless otherwise noted.  DIAGNOSTIC FINDINGS:  Post-void residual: Voiding Cystourethrogram (VCUG):  Ultrasound:   PATIENT SURVEYS:    PFIQ-7: 67 UIQ-7 67 CRAIG -7 0 POPIQ-7 0   COGNITION: Overall cognitive status: Within  functional limits for tasks assessed     SENSATION: Light touch: Appears intact  LUMBAR SPECIAL TESTS:  Slump test: Negative  FUNCTIONAL TESTS:   Single leg stance: trunks sway, decreased balance bilat  Rt:  Lt: Sit-up test: domes abdomen and holds breaths Squat: Bed mobility:WFL  GAIT: Assistive device utilized: None Comments: WFL  POSTURE: rounded shoulders, forward head, and increased lumbar lordosis   LUMBARAROM/PROM: stiffness throughout  A/PROM A/PROM  Eval (% available)  Flexion 60  Extension   Right lateral flexion 60  Left lateral flexion 60  Right rotation 60  Left rotation 60   (Blank rows = not tested)  LOWER EXTREMITY ROM: mild stiffness throughout  LOWER EXTREMITY MMT: bilateral hips 4/5  PALPATION:  General:   Pelvic Alignment: even  Abdominal:   Diastasis: Yes: 2 fingers Distortion: Yes  doming Breathing: upper chest Scar tissue: Yes: hysterectomy Active Straight Leg Raise:                 External Perineal Exam: mild dryness                             Internal Pelvic Floor: patient tends to hold tight, has difficulty with lengthening, tight and tender bulbocavernosus  Patient confirms identification and approves PT to assess internal pelvic floor and treatment Yes All internal or external pelvic floor assessments and/or treatments are completed with proper hand hygiene and gloves  hands. If needed gloves are changed with hand hygiene during patient care time.  PELVIC MMT:   MMT eval  Vaginal 4/5  Internal Anal Sphincter   External Anal Sphincter   Puborectalis   (Blank rows = not tested)        TONE: high  PROLAPSE: None noticed in hooklying  TODAY'S TREATMENT:                                                                                                                              DATE: 11/30/2024 Abdominal scar massage and cupping Diaphragmatic breathing Review of urge suppression strategies and education on bladder  retraining Trial of transcutaneous tibial nerve stimulation for urinary urgency. Pt educated on electrode placement near medial malleoli bilateral feet and electrodes placed. Tens unit turned up until strong and comfortable sensation was felt in bilateral feet and sensation of toes curling and then turned down. Current ran for 8 mins. Pt was educated on turning off unit, electrode cleaning and storing and protocol was given for self care at home. Pt to set up unit per protocol and reach out if help is needed with adjusting parameters to 200 HZ and 20 ns.       11/10/2024  EVAL  Examination completed, findings reviewed, pt educated on POC, HEP, and female pelvic floor anatomy, reasoning with pelvic floor assessment internally with pt consent. Pt motivated to participate in PT and agreeable to attempt recommendations.     PATIENT EDUCATION:  Education details: Pt was educated on relevant anatomy, exam findings, home exercise program, plan of care, expectations of PT and urge suppression techniques  Person educated: Patient Education method: Explanation, Demonstration, Tactile cues, Verbal cues, and Handouts Education comprehension: verbalized understanding, returned demonstration, verbal cues required, tactile cues required, and needs further education  HOME EXERCISE PROGRAM: Access Code: [email or web address] URL: [email or web address] Date: 11/10/2024 Prepared by: Cori Pema Thomure  Patient Education - Urinary Urge Control Techniques - Urinary Urge Control Techniques - Lifestyle Changes to Support Urinary and Bladder Health - Lifestyle Changes that Support Urinary Health  ASSESSMENT:  CLINICAL IMPRESSION: Patient was seen today for treatment of urinary urgency. Patient with some tightness abdominal scar. Patient did well with exercises, manual therapy and education today as well as trial of TTNS. We discussed progress, urge suppression techniques for bladder retraining and recommended not  just in case peeing as a habit. Patient is progressing slowly towards goals and will benefit from continued PT to address deficits, reduce urge incontinence and urgency and improve quality of life.        From eval Patient is a 69 y.o. F who was seen today for physical therapy evaluation and treatment for urge incontinence. Patient with signs of mixed incontinence. Patient reports that symptoms are affecting her life a lot. Patient reports being frustrated , always looking for a bathroom. Exam findings are notable for upper chest breathing strategies, abdominal diastasis with doming, pelvic floor muscle high tone,  tone in pelvic floor. External soft tissues of pelvic floor appear fine. Patient demonstrates decreased trunk mobility, bilateral hip weakness, trigger points in pelvic floor muscles, reduced range or motion in lumbar spine and tenderness with palpation in bilateral bulbocavernosus. It is difficult for patient to travel and be active due to urinary frequency and leaking . Discussed findings with patient, educated patient on urge suppression and HEP was initiated. Patient's quality of life has been affected, patient is frustrated and will benefit from physical therapy to address deficits, reduce urinary incontinence and improve urinary frequency and quality of life.  .   OBJECTIVE IMPAIRMENTS: decreased coordination, decreased ROM, decreased strength, increased fascial restrictions, increased muscle spasms, impaired tone, and obesity.   ACTIVITY LIMITATIONS: continence and toileting  PARTICIPATION LIMITATIONS: driving, shopping, community activity, and yard work  PERSONAL FACTORS: Time since onset of injury/illness/exacerbation are also affecting patient's functional outcome.   REHAB POTENTIAL: Good  CLINICAL DECISION MAKING: Evolving/moderate complexity  EVALUATION COMPLEXITY: Moderate   GOALS: Goals reviewed with patient? Yes  SHORT TERM GOALS: Target date: 12/08/2024    Pt  will be independent with HEP.   Baseline: Goal status: INITIAL  2.  Patient will report 2-3 hours between voids Baseline:  Goal status: INITIAL  3.  Patient will teach back urge suppression techniques Baseline:  Goal status: INITIAL  4.  Patient will be I with vulvovaginal massage to reduce pelvic floor tone Baseline:  Goal status: INITIAL   LONG TERM GOALS: Target date: 02/10/2025   Pt will be I and consistent with her HEP and demonstrate all exercises correctly  Baseline:  Goal status: INITIAL  2.  Pt will demonstrate 20 point improvement in PFIQ-7 score in order to show functional improvement in urinary incontinence.   Baseline: 67 Goal status: INITIAL  3.  Pt will soak 0 pads/ day in order to run errands and not have to interrupt daily tasks.  Baseline: 1 Goal status: INITIAL  4.  Patient will dem improved PROM/ AROM pelvic floor Baseline:  Goal status: INITIAL    PLAN:  PT FREQUENCY: 1-2x/week  PT DURATION: 12 weeks  PLANNED INTERVENTIONS: 97110-Therapeutic exercises, 97530- Therapeutic activity, 97112- Neuromuscular re-education, 97535- Self Care, 02859- Manual therapy, [phone]- Electrical stimulation (manual), [phone]- Ionotophoresis 4mg /ml Dexamethasone, 20560 (1-2 muscles), 20561 (3+ muscles)- Dry Needling, Patient/Family education, Taping, Joint mobilization, Joint manipulation, Spinal manipulation, Spinal mobilization, Manual lymph drainage, Scar mobilization, Cryotherapy, Moist heat, and Biofeedback  PLAN FOR NEXT SESSION: diaphragmatic breathing, stretches for down training, TTNS, urge suppression, bladder diary, scar massage  Dawson Hollman, PT 11/30/2024, 1:14 PM

## 2024-12-06 NOTE — Therapy (Signed)
 OUTPATIENT PHYSICAL THERAPY FEMALE PELVIC TREATMENT   Patient Name: Cheryl Morton MRN: [id number] DOB:1956/03/13, 68 y.o., female Today's Date: 12/07/2024  END OF SESSION:  PT End of Session - 12/07/24 1017     Visit Number 3    Date for Recertification  02/10/25    Authorization Type [facility] Medicare    Authorization Time Period [employer] aprroved 12vl, 11/10/2024 - 01/19/2025, jluy#[id number]    Authorization - Visit Number 2    Authorization - Number of Visits 12    Progress Note Due on Visit 10    PT Start Time 1016    PT Stop Time 1100    PT Time Calculation (min) 44 min    Activity Tolerance Patient tolerated treatment well;No increased pain    Behavior During Therapy WFL for tasks assessed/performed           Past Medical History:  Diagnosis Date   Allergy    seasonal allergies   Anxiety    has PRN meds   Depression    on meds   Diabetes mellitus without complication (HCC)    on meds   GERD (gastroesophageal reflux disease)    on meds   Glaucoma    on meds   Hyperlipidemia    on meds   Hypertension    on meds   Past Surgical History:  Procedure Laterality Date   ABDOMINAL HYSTERECTOMY     Fibroids/DUB.  Ovaries intact.   WISDOM TOOTH EXTRACTION     Patient Active Problem List   Diagnosis Date Noted   Former smoker 11/10/2018   Seasonal allergic rhinitis due to pollen 09/20/2017   Pure hypercholesterolemia 03/12/2017   Anxiety and depression 07/22/2016   Class 1 obesity due to excess calories with serious comorbidity and body mass index (BMI) of 34.0 to 34.9 in adult 08/18/2013   Diabetes mellitus (HCC) 09/24/2012   GERD 06/27/2010   ESSENTIAL HYPERTENSION, BENIGN 07/25/2009    PCP: Bettie Santana LABOR, MD  REFERRING PROVIDER: Vernon Velna SAUNDERS, MD  REFERRING DIAG: N39.41 (ICD-10-CM) - Urgency incontinence   THERAPY DIAG:  Cramp and spasm  Rationale for Evaluation and Treatment: Rehabilitation  ONSET DATE: 2024  SUBJECTIVE:                                                                                                                                                                                            SUBJECTIVE STATEMENT: Seeing improvement Urge drill is helpful, helps to hold of Sometimes has to run Got her TENS unit yesterday      Patient reports that she she tried to get her exercises to work.  One day she was able to hold her urine for 45 mins at home 30 mins or less is her norm, urinates a lot.     eval Patient reports that since last year she cannot make it to the bathroom, she will start dripping. She has to pee frequently, every hour or so.  Hx of hysterectomy and painful fibroids.   Fluid intake: her mouth is dry because of C pap machine, drinks a lot- colored water-0 sugar drinks   FUNCTIONAL LIMITATIONS: frequent urination, leaks  PERTINENT HISTORY:  Medications for current condition: no Surgeries: - Other: - Sexual abuse: No  PAIN:  Are you having pain? No  PRECAUTIONS: None  RED FLAGS: None   WEIGHT BEARING RESTRICTIONS: No  FALLS:  Has patient fallen in last 6 months? No  OCCUPATION: retired  ACTIVITY LEVEL : very active, gym 2-3 times/ week, travels a lot  PLOF: Independent  PATIENT GOALS: lose about 30 lbs, less stops at the bathroom, less frequency   BOWEL MOVEMENT:no issues URINATION: Pain with urination: No Fully empty bladder: No                                         Post-void dribble: Yes  Stream: Strong and Weak Urgency: No Frequency:during the day yes                                                        Nocturia: Yes: 2   Leakage: Urge to void, Walking to the bathroom, Coughing, and Sneezing Pads/briefs: Yes: 1  INTERCOURSE: not active   PREGNANCY: miscarriage 1 time   PROLAPSE: None   OBJECTIVE:  Note: Objective measures were completed at Evaluation unless otherwise noted.  DIAGNOSTIC FINDINGS:  Post-void residual: Voiding Cystourethrogram  (VCUG):  Ultrasound:   PATIENT SURVEYS:    PFIQ-7: 67 UIQ-7 67 CRAIG -7 0 POPIQ-7 0   COGNITION: Overall cognitive status: Within functional limits for tasks assessed     SENSATION: Light touch: Appears intact  LUMBAR SPECIAL TESTS:  Slump test: Negative  FUNCTIONAL TESTS:   Single leg stance: trunks sway, decreased balance bilat  Rt:  Lt: Sit-up test: domes abdomen and holds breaths Squat: Bed mobility:WFL  GAIT: Assistive device utilized: None Comments: WFL  POSTURE: rounded shoulders, forward head, and increased lumbar lordosis   LUMBARAROM/PROM: stiffness throughout  A/PROM A/PROM  Eval (% available)  Flexion 60  Extension   Right lateral flexion 60  Left lateral flexion 60  Right rotation 60  Left rotation 60   (Blank rows = not tested)  LOWER EXTREMITY ROM: mild stiffness throughout  LOWER EXTREMITY MMT: bilateral hips 4/5  PALPATION:  General:   Pelvic Alignment: even  Abdominal:   Diastasis: Yes: 2 fingers Distortion: Yes  doming Breathing: upper chest Scar tissue: Yes: hysterectomy Active Straight Leg Raise:                 External Perineal Exam: mild dryness                             Internal Pelvic Floor: patient tends to hold tight, has difficulty with lengthening, tight and tender bulbocavernosus  Patient confirms identification  and approves PT to assess internal pelvic floor and treatment Yes All internal or external pelvic floor assessments and/or treatments are completed with proper hand hygiene and gloves hands. If needed gloves are changed with hand hygiene during patient care time.  PELVIC MMT:   MMT eval  Vaginal 4/5  Internal Anal Sphincter   External Anal Sphincter   Puborectalis   (Blank rows = not tested)        TONE: high  PROLAPSE: None noticed in hooklying  TODAY'S TREATMENT:                                                                                                                               DATE: 12/07/2024 Trial of transcutaneous tibial nerve stimulation for urinary urgency. Pt educated on electrode placement near medial malleoli bilateral feet and electrodes placed. Tens unit turned up until strong and comfortable sensation was felt in bilateral feet and sensation of toes curling and then turned down. Current ran for 20 mins. Pt was educated on turning off unit, electrode cleaning and storing and protocol was given for self care at home. Pt to set up unit per protocol and reach out if help is needed with adjusting parameters to 200 HZ and 20 ns.   Bladder retraining 2 week water bladder reset Education on vaginal estrogen- patient will reach out to her dr Bilateral knee fallouts with black loop 15 reps and with green theraband loop 15 reps supine    11/30/2024 Abdominal scar massage and cupping Diaphragmatic breathing Review of urge suppression strategies and education on bladder retraining Trial of transcutaneous tibial nerve stimulation for urinary urgency. Pt educated on electrode placement near medial malleoli bilateral feet and electrodes placed. Tens unit turned up until strong and comfortable sensation was felt in bilateral feet and sensation of toes curling and then turned down. Current ran for 8 mins. Pt was educated on turning off unit, electrode cleaning and storing and protocol was given for self care at home. Pt to set up unit per protocol and reach out if help is needed with adjusting parameters to 200 HZ and 20 ns.       11/10/2024  EVAL  Examination completed, findings reviewed, pt educated on POC, HEP, and female pelvic floor anatomy, reasoning with pelvic floor assessment internally with pt consent. Pt motivated to participate in PT and agreeable to attempt recommendations.     PATIENT EDUCATION:  Education details: Pt was educated on relevant anatomy, exam findings, home exercise program, plan of care, expectations of PT and urge suppression techniques   Person educated: Patient Education method: Explanation, Demonstration, Tactile cues, Verbal cues, and Handouts Education comprehension: verbalized understanding, returned demonstration, verbal cues required, tactile cues required, and needs further education  HOME EXERCISE PROGRAM: Access Code: [email or web address] URL: [email or web address] Date: 11/10/2024 Prepared by: Cori Fermon Ureta  Patient Education - Urinary Urge Control Techniques - Urinary Urge Control Techniques - Lifestyle Changes to Support Urinary and Bladder  Health - Lifestyle Changes that Support Urinary Health  ASSESSMENT:  CLINICAL IMPRESSION: Patient was seen today for treatment of urinary urgency. Patient did well with placing TTNS and urge suppression strategies. She is seeing some improvement already but nervous that she will leak as she is going on a bus trip tomorrow to [facility] (12 hrs). We discussed progress, urge suppression techniques for bladder retraining and recommended not just in case peeing as a habit and 2 week water only bladder reset after she comes from her trip. Patient has some bilateral hip weakness and will benefit from hip and pelvic floor strengthening aftre she learns vulvovaginal massage next. Patient is progressing slowly towards goals and will benefit from continued PT to address deficits, reduce urge incontinence and urgency and improve quality of life.        From eval Patient is a 68 y.o. F who was seen today for physical therapy evaluation and treatment for urge incontinence. Patient with signs of mixed incontinence. Patient reports that symptoms are affecting her life a lot. Patient reports being frustrated , always looking for a bathroom. Exam findings are notable for upper chest breathing strategies, abdominal diastasis with doming, pelvic floor muscle high tone,  tone in pelvic floor. External soft tissues of pelvic floor appear fine. Patient demonstrates decreased trunk mobility, bilateral  hip weakness, trigger points in pelvic floor muscles, reduced range or motion in lumbar spine and tenderness with palpation in bilateral bulbocavernosus. It is difficult for patient to travel and be active due to urinary frequency and leaking . Discussed findings with patient, educated patient on urge suppression and HEP was initiated. Patient's quality of life has been affected, patient is frustrated and will benefit from physical therapy to address deficits, reduce urinary incontinence and improve urinary frequency and quality of life.  .   OBJECTIVE IMPAIRMENTS: decreased coordination, decreased ROM, decreased strength, increased fascial restrictions, increased muscle spasms, impaired tone, and obesity.   ACTIVITY LIMITATIONS: continence and toileting  PARTICIPATION LIMITATIONS: driving, shopping, community activity, and yard work  PERSONAL FACTORS: Time since onset of injury/illness/exacerbation are also affecting patient's functional outcome.   REHAB POTENTIAL: Good  CLINICAL DECISION MAKING: Evolving/moderate complexity  EVALUATION COMPLEXITY: Moderate   GOALS: Goals reviewed with patient? Yes  SHORT TERM GOALS: Target date: 12/08/2024    Pt will be independent with HEP.   Baseline: Goal status: INITIAL  2.  Patient will report 2-3 hours between voids Baseline: 30 mins Goal status: INITIAL  3.  Patient will teach back urge suppression techniques Baseline:  Goal status: ongoing   4.  Patient will be I with vulvovaginal massage to reduce pelvic floor tone Baseline:  Goal status: INITIAL   LONG TERM GOALS: Target date: 02/10/2025   Pt will be I and consistent with her HEP and demonstrate all exercises correctly  Baseline:  Goal status: INITIAL  2.  Pt will demonstrate 20 point improvement in PFIQ-7 score in order to show functional improvement in urinary incontinence.   Baseline: 67 Goal status: INITIAL  3.  Pt will soak 0 pads/ day in order to run errands and not  have to interrupt daily tasks.  Baseline: 1 Goal status: INITIAL  4.  Patient will dem improved PROM/ AROM pelvic floor Baseline:  Goal status: INITIAL    PLAN:  PT FREQUENCY: 1-2x/week  PT DURATION: 12 weeks  PLANNED INTERVENTIONS: 97110-Therapeutic exercises, 97530- Therapeutic activity, [id number]- Neuromuscular re-education, 97535- Self Care, 02859- Manual therapy, [id number]- Electrical stimulation (manual), [id number]- Ionotophoresis 4mg /ml Dexamethasone, 20560 (  1-2 muscles), 20561 (3+ muscles)- Dry Needling, Patient/Family education, Taping, Joint mobilization, Joint manipulation, Spinal manipulation, Spinal mobilization, Manual lymph drainage, Scar mobilization, Cryotherapy, Moist heat, and Biofeedback  PLAN FOR NEXT SESSION: diaphragmatic breathing, stretches for down training, TTNS, urge suppression, bladder diary, scar massage, 2 week only  bladder reset  Shaelynn Dragos, PT 12/07/2024, 10:18 AM

## 2024-12-07 ENCOUNTER — Encounter: Payer: Self-pay | Admitting: Physical Therapy

## 2024-12-07 ENCOUNTER — Ambulatory Visit: Admitting: Physical Therapy

## 2024-12-07 DIAGNOSIS — R252 Cramp and spasm: Secondary | ICD-10-CM

## 2024-12-13 NOTE — Therapy (Signed)
 " OUTPATIENT PHYSICAL THERAPY FEMALE PELVIC TREATMENT   Patient Name: Cheryl Morton MRN: [id number] DOB:04-14-1956, 68 y.o., female Today's Date: 12/14/2024  END OF SESSION:  PT End of Session - 12/14/24 1504     Visit Number 4    Date for Recertification  02/10/25    Authorization Type [facility] Medicare    Authorization Time Period [employer] aprroved 12vl, 11/10/2024 - 01/19/2025, [id number]    Authorization - Visit Number 3    Authorization - Number of Visits 12    Progress Note Due on Visit 10    PT Start Time 1503    PT Stop Time 1530    PT Time Calculation (min) 27 min    Activity Tolerance Patient tolerated treatment well;No increased pain    Behavior During Therapy WFL for tasks assessed/performed            Past Medical History:  Diagnosis Date   Allergy    seasonal allergies   Anxiety    has PRN meds   Depression    on meds   Diabetes mellitus without complication (HCC)    on meds   GERD (gastroesophageal reflux disease)    on meds   Glaucoma    on meds   Hyperlipidemia    on meds   Hypertension    on meds   Past Surgical History:  Procedure Laterality Date   ABDOMINAL HYSTERECTOMY     Fibroids/DUB.  Ovaries intact.   WISDOM TOOTH EXTRACTION     Patient Active Problem List   Diagnosis Date Noted   Former smoker 11/10/2018   Seasonal allergic rhinitis due to pollen 09/20/2017   Pure hypercholesterolemia 03/12/2017   Anxiety and depression 07/22/2016   Class 1 obesity due to excess calories with serious comorbidity and body mass index (BMI) of 34.0 to 34.9 in adult 08/18/2013   Diabetes mellitus (HCC) 09/24/2012   GERD 06/27/2010   ESSENTIAL HYPERTENSION, BENIGN 07/25/2009    PCP: Bettie Santana LABOR, MD  REFERRING PROVIDER: Vernon Velna SAUNDERS, MD  REFERRING DIAG: N39.41 (ICD-10-CM) - Urgency incontinence   THERAPY DIAG:  Cramp and spasm  Rationale for Evaluation and Treatment: Rehabilitation  ONSET DATE: 2024  SUBJECTIVE:                                                                                                                                                                                            SUBJECTIVE STATEMENT: Patient reports that she was able to hold urine on her trip for 3 hours She did deep breathing and urinary urge suppression strategies Does not have to get up anymore to pee as  much Likes sparkling water      Last session Seeing improvement Urge drill is helpful, helps to hold of Sometimes has to run Got her TENS unit yesterday     Last session Patient reports that she she tried to get her exercises to work.  One day she was able to hold her urine for 45 mins at home 30 mins or less is her norm, urinates a lot.     eval Patient reports that since last year she cannot make it to the bathroom, she will start dripping. She has to pee frequently, every hour or so.  Hx of hysterectomy and painful fibroids.   Fluid intake: her mouth is dry because of C pap machine, drinks a lot- colored water-0 sugar drinks   FUNCTIONAL LIMITATIONS: frequent urination, leaks  PERTINENT HISTORY:  Medications for current condition: no Surgeries: - Other: - Sexual abuse: No  PAIN:  Are you having pain? No  PRECAUTIONS: None  RED FLAGS: None   WEIGHT BEARING RESTRICTIONS: No  FALLS:  Has patient fallen in last 6 months? No  OCCUPATION: retired  ACTIVITY LEVEL : very active, gym 2-3 times/ week, travels a lot  PLOF: Independent  PATIENT GOALS: lose about 30 lbs, less stops at the bathroom, less frequency   BOWEL MOVEMENT:no issues URINATION: Pain with urination: No Fully empty bladder: No                                         Post-void dribble: Yes  Stream: Strong and Weak Urgency: No Frequency:during the day yes                                                        Nocturia: Yes: 2   Leakage: Urge to void, Walking to the bathroom, Coughing, and Sneezing Pads/briefs: Yes:  1  INTERCOURSE: not active   PREGNANCY: miscarriage 1 time   PROLAPSE: None   OBJECTIVE:  Note: Objective measures were completed at Evaluation unless otherwise noted.  DIAGNOSTIC FINDINGS:  Post-void residual: Voiding Cystourethrogram (VCUG):  Ultrasound:   PATIENT SURVEYS:    PFIQ-7: 67 UIQ-7 67 CRAIG -7 0 POPIQ-7 0   COGNITION: Overall cognitive status: Within functional limits for tasks assessed     SENSATION: Light touch: Appears intact  LUMBAR SPECIAL TESTS:  Slump test: Negative  FUNCTIONAL TESTS:   Single leg stance: trunks sway, decreased balance bilat  Rt:  Lt: Sit-up test: domes abdomen and holds breaths Squat: Bed mobility:WFL  GAIT: Assistive device utilized: None Comments: WFL  POSTURE: rounded shoulders, forward head, and increased lumbar lordosis   LUMBARAROM/PROM: stiffness throughout  A/PROM A/PROM  Eval (% available)  Flexion 60  Extension   Right lateral flexion 60  Left lateral flexion 60  Right rotation 60  Left rotation 60   (Blank rows = not tested)  LOWER EXTREMITY ROM: mild stiffness throughout  LOWER EXTREMITY MMT: bilateral hips 4/5  PALPATION:  General:   Pelvic Alignment: even  Abdominal:   Diastasis: Yes: 2 fingers Distortion: Yes  doming Breathing: upper chest Scar tissue: Yes: hysterectomy Active Straight Leg Raise:                 External Perineal Exam: mild  dryness                             Internal Pelvic Floor: patient tends to hold tight, has difficulty with lengthening, tight and tender bulbocavernosus  Patient confirms identification and approves PT to assess internal pelvic floor and treatment Yes All internal or external pelvic floor assessments and/or treatments are completed with proper hand hygiene and gloves hands. If needed gloves are changed with hand hygiene during patient care time.  PELVIC MMT:   MMT eval  Vaginal 4/5  Internal Anal Sphincter   External Anal Sphincter    Puborectalis   (Blank rows = not tested)        TONE: high  PROLAPSE: None noticed in hooklying  TODAY'S TREATMENT:                                                                                                                              DATE: 12/14/2024 Trial of transcutaneous tibial nerve stimulation for urinary urgency. Pt educated on electrode placement near medial malleoli bilateral feet and electrodes placed. Tens unit turned up until strong and comfortable sensation was felt in bilateral feet and sensation of toes curling and then turned down. Current ran for 15 mins. Pt was educated on turning off unit, electrode cleaning and storing and protocol was given for self care at home. Pt to set up unit per protocol and reach out if help is needed with adjusting parameters to 200 HZ and 20 ns.   Patient practiced placement on bilateral feet Discussed bladder irritants ( she drinks sparkling water)     12/07/2024 Trial of transcutaneous tibial nerve stimulation for urinary urgency. Pt educated on electrode placement near medial malleoli bilateral feet and electrodes placed. Tens unit turned up until strong and comfortable sensation was felt in bilateral feet and sensation of toes curling and then turned down. Current ran for 20 mins. Pt was educated on turning off unit, electrode cleaning and storing and protocol was given for self care at home. Pt to set up unit per protocol and reach out if help is needed with adjusting parameters to 200 HZ and 20 ns.   Bladder retraining 2 week water bladder reset Education on vaginal estrogen- patient will reach out to her dr Bilateral knee fallouts with black loop 15 reps and with green theraband loop 15 reps supine    11/30/2024 Abdominal scar massage and cupping Diaphragmatic breathing Review of urge suppression strategies and education on bladder retraining Trial of transcutaneous tibial nerve stimulation for urinary urgency. Pt educated  on electrode placement near medial malleoli bilateral feet and electrodes placed. Tens unit turned up until strong and comfortable sensation was felt in bilateral feet and sensation of toes curling and then turned down. Current ran for 8 mins. Pt was educated on turning off unit, electrode cleaning and storing and protocol was given for self care at home.  Pt to set up unit per protocol and reach out if help is needed with adjusting parameters to 200 HZ and 20 ns.       11/10/2024  EVAL  Examination completed, findings reviewed, pt educated on POC, HEP, and female pelvic floor anatomy, reasoning with pelvic floor assessment internally with pt consent. Pt motivated to participate in PT and agreeable to attempt recommendations.     PATIENT EDUCATION:  Education details: Pt was educated on relevant anatomy, exam findings, home exercise program, plan of care, expectations of PT and urge suppression techniques  Person educated: Patient Education method: Explanation, Demonstration, Tactile cues, Verbal cues, and Handouts Education comprehension: verbalized understanding, returned demonstration, verbal cues required, tactile cues required, and needs further education  HOME EXERCISE PROGRAM: Access Code: [email or web address] URL: [email or web address] Date: 11/10/2024 Prepared by: Cori Oddie Kuhlmann  Patient Education - Urinary Urge Control Techniques - Urinary Urge Control Techniques - Lifestyle Changes to Support Urinary and Bladder Health - Lifestyle Changes that Support Urinary Health  ASSESSMENT:  CLINICAL IMPRESSION: Patient was seen today for treatment of urinary urgency. Patient did well with placing TTNS, using for 15 mins and urge suppression strategies. She is seeing some improvement, was able to hold her urine on her bus trip. Did not have to get up at night. We discussed progress, urge suppression techniques for bladder retraining and recommended not just in case peeing as a habit and 2  week water only bladder reset after she comes from her trip. Patient has some bilateral hip weakness and will benefit from hip and pelvic floor strengthening after she learns vulvovaginal massage next. Patient is progressing slowly towards goals and will benefit from continued PT to address deficits, reduce urge incontinence and urgency and improve quality of life.        From eval Patient is a 68 y.o. F who was seen today for physical therapy evaluation and treatment for urge incontinence. Patient with signs of mixed incontinence. Patient reports that symptoms are affecting her life a lot. Patient reports being frustrated , always looking for a bathroom. Exam findings are notable for upper chest breathing strategies, abdominal diastasis with doming, pelvic floor muscle high tone,  tone in pelvic floor. External soft tissues of pelvic floor appear fine. Patient demonstrates decreased trunk mobility, bilateral hip weakness, trigger points in pelvic floor muscles, reduced range or motion in lumbar spine and tenderness with palpation in bilateral bulbocavernosus. It is difficult for patient to travel and be active due to urinary frequency and leaking . Discussed findings with patient, educated patient on urge suppression and HEP was initiated. Patient's quality of life has been affected, patient is frustrated and will benefit from physical therapy to address deficits, reduce urinary incontinence and improve urinary frequency and quality of life.  .   OBJECTIVE IMPAIRMENTS: decreased coordination, decreased ROM, decreased strength, increased fascial restrictions, increased muscle spasms, impaired tone, and obesity.   ACTIVITY LIMITATIONS: continence and toileting  PARTICIPATION LIMITATIONS: driving, shopping, community activity, and yard work  PERSONAL FACTORS: Time since onset of injury/illness/exacerbation are also affecting patient's functional outcome.   REHAB POTENTIAL: Good  CLINICAL DECISION  MAKING: Evolving/moderate complexity  EVALUATION COMPLEXITY: Moderate   GOALS: Goals reviewed with patient? Yes  SHORT TERM GOALS: Target date: 12/08/2024    Pt will be independent with HEP.   Baseline: Goal status: INITIAL  2.  Patient will report 2-3 hours between voids Baseline: 30 mins Goal status: INITIAL  3.  Patient will teach back urge suppression techniques Baseline:  Goal status: ongoing   4.  Patient will be I with vulvovaginal massage to reduce pelvic floor tone Baseline:  Goal status: INITIAL   LONG TERM GOALS: Target date: 02/10/2025   Pt will be I and consistent with her HEP and demonstrate all exercises correctly  Baseline:  Goal status: INITIAL  2.  Pt will demonstrate 20 point improvement in PFIQ-7 score in order to show functional improvement in urinary incontinence.   Baseline: 67 Goal status: INITIAL  3.  Pt will soak 0 pads/ day in order to run errands and not have to interrupt daily tasks.  Baseline: 1 Goal status: INITIAL  4.  Patient will dem improved PROM/ AROM pelvic floor Baseline:  Goal status: INITIAL    PLAN:  PT FREQUENCY: 1-2x/week  PT DURATION: 12 weeks  PLANNED INTERVENTIONS: 97110-Therapeutic exercises, 97530- Therapeutic activity, 97112- Neuromuscular re-education, 97535- Self Care, [id number]- Manual therapy, [phone]- Electrical stimulation (manual), [phone]- Ionotophoresis 4mg /ml Dexamethasone, [id number] (1-2 muscles), [phone] (3+ muscles)- Dry Needling, Patient/Family education, Taping, Joint mobilization, Joint manipulation, Spinal manipulation, Spinal mobilization, Manual lymph drainage, Scar mobilization, Cryotherapy, Moist heat, and Biofeedback  PLAN FOR NEXT SESSION: diaphragmatic breathing, stretches for down training, TTNS, urge suppression, bladder diary, scar massage, 2 week only  bladder reset  Girtie Wiersma, PT 12/14/2024, 3:05 PM  "

## 2024-12-14 ENCOUNTER — Ambulatory Visit: Admitting: Physical Therapy

## 2024-12-14 ENCOUNTER — Encounter: Payer: Self-pay | Admitting: Physical Therapy

## 2024-12-14 DIAGNOSIS — R252 Cramp and spasm: Secondary | ICD-10-CM

## 2024-12-30 NOTE — Therapy (Addendum)
 " OUTPATIENT PHYSICAL THERAPY FEMALE PELVIC TREATMENT   Patient Name: Cheryl Morton MRN: [id number] DOB:08/12/1956, 69 y.o., female Today's Date: 12/31/2024  END OF SESSION:  PT End of Session - 12/31/24 1150     Visit Number 4    Date for Recertification  02/10/25    Authorization Type [facility] Medicare    Authorization Time Period [employer] aprroved 12vl, 11/10/2024 - 01/19/2025, [id number]    Authorization - Visit Number 4    Authorization - Number of Visits 12    Progress Note Due on Visit 10    PT Start Time 1150    PT Stop Time 1230    PT Time Calculation (min) 40 min    Activity Tolerance Patient tolerated treatment well;No increased pain    Behavior During Therapy WFL for tasks assessed/performed             Past Medical History:  Diagnosis Date   Allergy    seasonal allergies   Anxiety    has PRN meds   Depression    on meds   Diabetes mellitus without complication (HCC)    on meds   GERD (gastroesophageal reflux disease)    on meds   Glaucoma    on meds   Hyperlipidemia    on meds   Hypertension    on meds   Past Surgical History:  Procedure Laterality Date   ABDOMINAL HYSTERECTOMY     Fibroids/DUB.  Ovaries intact.   WISDOM TOOTH EXTRACTION     Patient Active Problem List   Diagnosis Date Noted   Former smoker 11/10/2018   Seasonal allergic rhinitis due to pollen 09/20/2017   Pure hypercholesterolemia 03/12/2017   Anxiety and depression 07/22/2016   Class 1 obesity due to excess calories with serious comorbidity and body mass index (BMI) of 34.0 to 34.9 in adult 08/18/2013   Diabetes mellitus (HCC) 09/24/2012   GERD 06/27/2010   ESSENTIAL HYPERTENSION, BENIGN 07/25/2009    PCP: Bettie Santana LABOR, MD  REFERRING PROVIDER: Vernon Velna SAUNDERS, MD  REFERRING DIAG: N39.41 (ICD-10-CM) - Urgency incontinence   THERAPY DIAG:  Cramp and spasm  Rationale for Evaluation and Treatment: Rehabilitation  ONSET DATE: 2024  SUBJECTIVE:                                                                                                                                                                                            SUBJECTIVE STATEMENT: Patient reports that she is doing well, using TENS. Patient reports that she was able to hold urine on her  for 3 hours Happy with progress Was under the weather last week,  it was hard to hold urine Leaks with drinking coffee and holding too long, stil has to run to bathroom Could not pee the other day, gripped so hard, sat on the toilet 3-4 mins and went bathroom normally later    Last session Patient reports that she was able to hold urine on her trip for 3 hours She did deep breathing and urinary urge suppression strategies Does not have to get up anymore to pee as much Likes sparkling water      Last session Seeing improvement Urge drill is helpful, helps to hold of Sometimes has to run Got her TENS unit yesterday     Last session Patient reports that she she tried to get her exercises to work.  One day she was able to hold her urine for 45 mins at home 30 mins or less is her norm, urinates a lot.     eval Patient reports that since last year she cannot make it to the bathroom, she will start dripping. She has to pee frequently, every hour or so.  Hx of hysterectomy and painful fibroids.   Fluid intake: her mouth is dry because of C pap machine, drinks a lot- colored water-0 sugar drinks   FUNCTIONAL LIMITATIONS: frequent urination, leaks  PERTINENT HISTORY:  Medications for current condition: no Surgeries: - Other: - Sexual abuse: No  PAIN:  Are you having pain? No  PRECAUTIONS: None  RED FLAGS: None   WEIGHT BEARING RESTRICTIONS: No  FALLS:  Has patient fallen in last 6 months? No  OCCUPATION: retired  ACTIVITY LEVEL : very active, gym 2-3 times/ week, travels a lot  PLOF: Independent  PATIENT GOALS: lose about 30 lbs, less stops at the bathroom, less  frequency   BOWEL MOVEMENT:no issues URINATION: Pain with urination: No Fully empty bladder: No                                         Post-void dribble: Yes  Stream: Strong and Weak Urgency: No Frequency:during the day yes                                                        Nocturia: Yes: 2   Leakage: Urge to void, Walking to the bathroom, Coughing, and Sneezing Pads/briefs: Yes: 1  INTERCOURSE: not active   PREGNANCY: miscarriage 1 time   PROLAPSE: None   OBJECTIVE:  Note: Objective measures were completed at Evaluation unless otherwise noted.  DIAGNOSTIC FINDINGS:  Post-void residual: Voiding Cystourethrogram (VCUG):  Ultrasound:   PATIENT SURVEYS:    PFIQ-7: 67 UIQ-7 67 CRAIG -7 0 POPIQ-7 0   COGNITION: Overall cognitive status: Within functional limits for tasks assessed     SENSATION: Light touch: Appears intact  LUMBAR SPECIAL TESTS:  Slump test: Negative  FUNCTIONAL TESTS:   Single leg stance: trunks sway, decreased balance bilat  Rt:  Lt: Sit-up test: domes abdomen and holds breaths Squat: Bed mobility:WFL  GAIT: Assistive device utilized: None Comments: WFL  POSTURE: rounded shoulders, forward head, and increased lumbar lordosis   LUMBARAROM/PROM: stiffness throughout  A/PROM A/PROM  Eval (% available)  Flexion 60  Extension   Right lateral flexion 60  Left lateral flexion 60  Right  rotation 60  Left rotation 60   (Blank rows = not tested)  LOWER EXTREMITY ROM: mild stiffness throughout  LOWER EXTREMITY MMT: bilateral hips 4/5  PALPATION:  General:   Pelvic Alignment: even  Abdominal:   Diastasis: Yes: 2 fingers Distortion: Yes  doming Breathing: upper chest Scar tissue: Yes: hysterectomy Active Straight Leg Raise:                 External Perineal Exam: mild dryness                             Internal Pelvic Floor: patient tends to hold tight, has difficulty with lengthening, tight and tender  bulbocavernosus  Patient confirms identification and approves PT to assess internal pelvic floor and treatment Yes All internal or external pelvic floor assessments and/or treatments are completed with proper hand hygiene and gloves hands. If needed gloves are changed with hand hygiene during patient care time.  PELVIC MMT:   MMT eval  Vaginal 4/5  Internal Anal Sphincter   External Anal Sphincter   Puborectalis   (Blank rows = not tested)        TONE: high  PROLAPSE: None noticed in hooklying  TODAY'S TREATMENT:                                                                                                                              DATE: 12/31/2024 Review of progress Seated HS stretch with diaphragmatic breathing with pelvic floor drop 3x30 reps QL stretch with rotation with diaphragmatic breathing 20 reps Will get her dr to prescribe vaginal  estrogen  Seated ball press into thigh + exhale + pelvic floor lift 20 reps Quadruped transverse abdominis breath difficult Hip adduction with ball +exhale + horizontal abduction with thera band +pelvic floor lift 15 reps The knack      12/14/2024 Trial of transcutaneous tibial nerve stimulation for urinary urgency. Pt educated on electrode placement near medial malleoli bilateral feet and electrodes placed. Tens unit turned up until strong and comfortable sensation was felt in bilateral feet and sensation of toes curling and then turned down. Current ran for 15 mins. Pt was educated on turning off unit, electrode cleaning and storing and protocol was given for self care at home. Pt to set up unit per protocol and reach out if help is needed with adjusting parameters to 200 HZ and 20 ns.   Patient practiced placement on bilateral feet Discussed bladder irritants ( she drinks sparkling water)     12/07/2024 Trial of transcutaneous tibial nerve stimulation for urinary urgency. Pt educated on electrode placement near medial malleoli  bilateral feet and electrodes placed. Tens unit turned up until strong and comfortable sensation was felt in bilateral feet and sensation of toes curling and then turned down. Current ran for 20 mins. Pt was educated on turning off unit, electrode cleaning and storing and protocol was given  for self care at home. Pt to set up unit per protocol and reach out if help is needed with adjusting parameters to 200 HZ and 20 ns.   Bladder retraining 2 week water bladder reset Education on vaginal estrogen- patient will reach out to her dr Bilateral knee fallouts with black loop 15 reps and with green theraband loop 15 reps supine    11/30/2024 Abdominal scar massage and cupping Diaphragmatic breathing Review of urge suppression strategies and education on bladder retraining Trial of transcutaneous tibial nerve stimulation for urinary urgency. Pt educated on electrode placement near medial malleoli bilateral feet and electrodes placed. Tens unit turned up until strong and comfortable sensation was felt in bilateral feet and sensation of toes curling and then turned down. Current ran for 8 mins. Pt was educated on turning off unit, electrode cleaning and storing and protocol was given for self care at home. Pt to set up unit per protocol and reach out if help is needed with adjusting parameters to 200 HZ and 20 ns.       11/10/2024  EVAL  Examination completed, findings reviewed, pt educated on POC, HEP, and female pelvic floor anatomy, reasoning with pelvic floor assessment internally with pt consent. Pt motivated to participate in PT and agreeable to attempt recommendations.     PATIENT EDUCATION:  Education details: Pt was educated on relevant anatomy, exam findings, home exercise program, plan of care, expectations of PT and urge suppression techniques  Person educated: Patient Education method: Explanation, Demonstration, Tactile cues, Verbal cues, and Handouts Education comprehension: verbalized  understanding, returned demonstration, verbal cues required, tactile cues required, and needs further education  HOME EXERCISE PROGRAM: Access Code: [email or web address] URL: [email or web address] Date: 11/10/2024 Prepared by: Cori Julus Kelley  Patient Education - Urinary Urge Control Techniques - Urinary Urge Control Techniques - Lifestyle Changes to Support Urinary and Bladder Health - Lifestyle Changes that Support Urinary Health  ASSESSMENT:  CLINICAL IMPRESSION: Patient was seen today for treatment of urinary urgency and mixed incontinence especially with coughing.  She had a little difficulty with coordination with new core exercises.  Urgency much better, still leaking with cough and sneeze, will continue to benefit from PT to reduce incontinence.       Patient was seen today for treatment of urinary urgency. Patient did well with placing TTNS, using for 15 mins and urge suppression strategies. She is seeing some improvement, was able to hold her urine on her bus trip. Did not have to get up at night. We discussed progress, urge suppression techniques for bladder retraining and recommended not just in case peeing as a habit and 2 week water only bladder reset after she comes from her trip. Patient has some bilateral hip weakness and will benefit from hip and pelvic floor strengthening after she learns vulvovaginal massage next. Patient is progressing slowly towards goals and will benefit from continued PT to address deficits, reduce urge incontinence and urgency and improve quality of life.        From eval Patient is a 69 y.o. F who was seen today for physical therapy evaluation and treatment for urge incontinence. Patient with signs of mixed incontinence. Patient reports that symptoms are affecting her life a lot. Patient reports being frustrated , always looking for a bathroom. Exam findings are notable for upper chest breathing strategies, abdominal diastasis with doming,  pelvic floor muscle high tone,  tone in pelvic floor. External soft tissues of pelvic floor appear fine. Patient demonstrates decreased trunk mobility, bilateral  hip weakness, trigger points in pelvic floor muscles, reduced range or motion in lumbar spine and tenderness with palpation in bilateral bulbocavernosus. It is difficult for patient to travel and be active due to urinary frequency and leaking . Discussed findings with patient, educated patient on urge suppression and HEP was initiated. Patient's quality of life has been affected, patient is frustrated and will benefit from physical therapy to address deficits, reduce urinary incontinence and improve urinary frequency and quality of life.  .   OBJECTIVE IMPAIRMENTS: decreased coordination, decreased ROM, decreased strength, increased fascial restrictions, increased muscle spasms, impaired tone, and obesity.   ACTIVITY LIMITATIONS: continence and toileting  PARTICIPATION LIMITATIONS: driving, shopping, community activity, and yard work  PERSONAL FACTORS: Time since onset of injury/illness/exacerbation are also affecting patient's functional outcome.   REHAB POTENTIAL: Good  CLINICAL DECISION MAKING: Evolving/moderate complexity  EVALUATION COMPLEXITY: Moderate   GOALS: Goals reviewed with patient? Yes  SHORT TERM GOALS: Target date: 12/08/2024    Pt will be independent with HEP.   Baseline: Goal status: met 12/30/24  2.  Patient will report 2-3 hours between voids Baseline: 30 mins Goal status: met - at times 12/30/24  3.  Patient will teach back urge suppression techniques Baseline:  Goal status: met 12/30/24  4.  Patient will be I with vulvovaginal massage to reduce pelvic floor tone Baseline:  Goal status: patient educated on and progressing 12/30/24   LONG TERM GOALS: Target date: 02/10/2025   Pt will be I and consistent with her HEP and demonstrate all exercises correctly  Baseline:  Goal status: met 12/30/24  2.  Pt  will demonstrate 20 point improvement in PFIQ-7 score in order to show functional improvement in urinary incontinence.   Baseline: 67 Goal status: not yet  3.  Pt will soak 0 pads/ day in order to run errands and not have to interrupt daily tasks.  Baseline: 1 Goal status: progressing 12/30/24  4.  Patient will dem improved PROM/ AROM pelvic floor Baseline:  Goal status: not yet    PLAN:  PT FREQUENCY: 1-2x/week  PT DURATION: 12 weeks  PLANNED INTERVENTIONS: 97110-Therapeutic exercises, 97530- Therapeutic activity, 97112- Neuromuscular re-education, 97535- Self Care, [id number]- Manual therapy, [phone]- Electrical stimulation (manual), [phone]- Ionotophoresis 4mg /ml Dexamethasone, 20560 (1-2 muscles), 20561 (3+ muscles)- Dry Needling, Patient/Family education, Taping, Joint mobilization, Joint manipulation, Spinal manipulation, Spinal mobilization, Manual lymph drainage, Scar mobilization, Cryotherapy, Moist heat, and Biofeedback  PLAN FOR NEXT SESSION: diaphragmatic breathing, stretches for down training, TTNS, urge suppression, bladder diary, scar massage, 2 week only  bladder reset, core strenthening  Lalana Wachter, PT 12/31/2024, 11:51 AM  "

## 2024-12-31 ENCOUNTER — Ambulatory Visit: Attending: Internal Medicine | Admitting: Physical Therapy

## 2024-12-31 ENCOUNTER — Encounter: Payer: Self-pay | Admitting: Physical Therapy

## 2024-12-31 DIAGNOSIS — R252 Cramp and spasm: Secondary | ICD-10-CM | POA: Insufficient documentation

## 2025-01-05 NOTE — Therapy (Incomplete)
 " OUTPATIENT PHYSICAL THERAPY FEMALE PELVIC TREATMENT   Patient Name: Cheryl Morton MRN: [id number] DOB:[birth year], 69 y.o., female Today's Date: 01/05/2025  END OF SESSION:       Past Medical History:  Diagnosis Date   Allergy    seasonal allergies   Anxiety    has PRN meds   Depression    on meds   Diabetes mellitus without complication (HCC)    on meds   GERD (gastroesophageal reflux disease)    on meds   Glaucoma    on meds   Hyperlipidemia    on meds   Hypertension    on meds   Past Surgical History:  Procedure Laterality Date   ABDOMINAL HYSTERECTOMY     Fibroids/DUB.  Ovaries intact.   WISDOM TOOTH EXTRACTION     Patient Active Problem List   Diagnosis Date Noted   Former smoker 11/10/2018   Seasonal allergic rhinitis due to pollen 09/20/2017   Pure hypercholesterolemia 03/12/2017   Anxiety and depression 07/22/2016   Class 1 obesity due to excess calories with serious comorbidity and body mass index (BMI) of 34.0 to 34.9 in adult 08/18/2013   Diabetes mellitus (HCC) 09/24/2012   GERD 06/27/2010   ESSENTIAL HYPERTENSION, BENIGN 07/25/2009    PCP: Bettie Santana LABOR, MD  REFERRING PROVIDER: Vernon Velna SAUNDERS, MD  REFERRING DIAG: N39.41 (ICD-10-CM) - Urgency incontinence   THERAPY DIAG:  No diagnosis found.  Rationale for Evaluation and Treatment: Rehabilitation  ONSET DATE: 2024  SUBJECTIVE:                                                                                                                                                                                           SUBJECTIVE STATEMENT: Patient reports that she is doing well, using TENS. Patient reports that she was able to hold urine on her  for 3 hours Happy with progress Was under the weather last week, it was hard to hold urine Leaks with drinking coffee and holding too long, stil has to run to bathroom Could not pee the other day, gripped so hard, sat on the toilet 3-4 mins and  went bathroom normally later    Last session Patient reports that she was able to hold urine on her trip for 3 hours She did deep breathing and urinary urge suppression strategies Does not have to get up anymore to pee as much Likes sparkling water      Last session Seeing improvement Urge drill is helpful, helps to hold of Sometimes has to run Got her TENS unit yesterday     Last session Patient reports that she  she tried to get her exercises to work.  One day she was able to hold her urine for 45 mins at home 30 mins or less is her norm, urinates a lot.     eval Patient reports that since last year she cannot make it to the bathroom, she will start dripping. She has to pee frequently, every hour or so.  Hx of hysterectomy and painful fibroids.   Fluid intake: her mouth is dry because of C pap machine, drinks a lot- colored water-0 sugar drinks   FUNCTIONAL LIMITATIONS: frequent urination, leaks  PERTINENT HISTORY:  Medications for current condition: no Surgeries: - Other: - Sexual abuse: No  PAIN:  Are you having pain? No  PRECAUTIONS: None  RED FLAGS: None   WEIGHT BEARING RESTRICTIONS: No  FALLS:  Has patient fallen in last 6 months? No  OCCUPATION: retired  ACTIVITY LEVEL : very active, gym 2-3 times/ week, travels a lot  PLOF: Independent  PATIENT GOALS: lose about 30 lbs, less stops at the bathroom, less frequency   BOWEL MOVEMENT:no issues URINATION: Pain with urination: No Fully empty bladder: No                                         Post-void dribble: Yes  Stream: Strong and Weak Urgency: No Frequency:during the day yes                                                        Nocturia: Yes: 2   Leakage: Urge to void, Walking to the bathroom, Coughing, and Sneezing Pads/briefs: Yes: 1  INTERCOURSE: not active   PREGNANCY: miscarriage 1 time   PROLAPSE: None   OBJECTIVE:  Note: Objective measures were completed at  Evaluation unless otherwise noted.  DIAGNOSTIC FINDINGS:  Post-void residual: Voiding Cystourethrogram (VCUG):  Ultrasound:   PATIENT SURVEYS:    PFIQ-7: 67 UIQ-7 67 CRAIG -7 0 POPIQ-7 0   COGNITION: Overall cognitive status: Within functional limits for tasks assessed     SENSATION: Light touch: Appears intact  LUMBAR SPECIAL TESTS:  Slump test: Negative  FUNCTIONAL TESTS:   Single leg stance: trunks sway, decreased balance bilat  Rt:  Lt: Sit-up test: domes abdomen and holds breaths Squat: Bed mobility:WFL  GAIT: Assistive device utilized: None Comments: WFL  POSTURE: rounded shoulders, forward head, and increased lumbar lordosis   LUMBARAROM/PROM: stiffness throughout  A/PROM A/PROM  Eval (% available)  Flexion 60  Extension   Right lateral flexion 60  Left lateral flexion 60  Right rotation 60  Left rotation 60   (Blank rows = not tested)  LOWER EXTREMITY ROM: mild stiffness throughout  LOWER EXTREMITY MMT: bilateral hips 4/5  PALPATION:  General:   Pelvic Alignment: even  Abdominal:   Diastasis: Yes: 2 fingers Distortion: Yes  doming Breathing: upper chest Scar tissue: Yes: hysterectomy Active Straight Leg Raise:                 External Perineal Exam: mild dryness                             Internal Pelvic Floor: patient tends to hold tight, has difficulty with  lengthening, tight and tender bulbocavernosus  Patient confirms identification and approves PT to assess internal pelvic floor and treatment Yes All internal or external pelvic floor assessments and/or treatments are completed with proper hand hygiene and gloves hands. If needed gloves are changed with hand hygiene during patient care time.  PELVIC MMT:   MMT eval  Vaginal 4/5  Internal Anal Sphincter   External Anal Sphincter   Puborectalis   (Blank rows = not tested)        TONE: high  PROLAPSE: None noticed in hooklying  TODAY'S TREATMENT:                                                                                                                               DATE: 12/31/2024 Review of progress Seated HS stretch with diaphragmatic breathing with pelvic floor drop 3x30 reps QL stretch with rotation with diaphragmatic breathing 20 reps Will get her dr to prescribe vaginal  estrogen  Seated ball press into thigh + exhale + pelvic floor lift 20 reps Quadruped transverse abdominis breath difficult Hip adduction with ball +exhale + horizontal abduction with thera band +pelvic floor lift 15 reps The knack      12/14/2024 Trial of transcutaneous tibial nerve stimulation for urinary urgency. Pt educated on electrode placement near medial malleoli bilateral feet and electrodes placed. Tens unit turned up until strong and comfortable sensation was felt in bilateral feet and sensation of toes curling and then turned down. Current ran for 15 mins. Pt was educated on turning off unit, electrode cleaning and storing and protocol was given for self care at home. Pt to set up unit per protocol and reach out if help is needed with adjusting parameters to 200 HZ and 20 ns.   Patient practiced placement on bilateral feet Discussed bladder irritants ( she drinks sparkling water)     12/07/2024 Trial of transcutaneous tibial nerve stimulation for urinary urgency. Pt educated on electrode placement near medial malleoli bilateral feet and electrodes placed. Tens unit turned up until strong and comfortable sensation was felt in bilateral feet and sensation of toes curling and then turned down. Current ran for 20 mins. Pt was educated on turning off unit, electrode cleaning and storing and protocol was given for self care at home. Pt to set up unit per protocol and reach out if help is needed with adjusting parameters to 200 HZ and 20 ns.   Bladder retraining 2 week water bladder reset Education on vaginal estrogen- patient will reach out to her dr Bilateral knee  fallouts with black loop 15 reps and with green theraband loop 15 reps supine    11/30/2024 Abdominal scar massage and cupping Diaphragmatic breathing Review of urge suppression strategies and education on bladder retraining Trial of transcutaneous tibial nerve stimulation for urinary urgency. Pt educated on electrode placement near medial malleoli bilateral feet and electrodes placed. Tens unit turned up until strong and comfortable sensation was felt in  bilateral feet and sensation of toes curling and then turned down. Current ran for 8 mins. Pt was educated on turning off unit, electrode cleaning and storing and protocol was given for self care at home. Pt to set up unit per protocol and reach out if help is needed with adjusting parameters to 200 HZ and 20 ns.       11/10/2024  EVAL  Examination completed, findings reviewed, pt educated on POC, HEP, and female pelvic floor anatomy, reasoning with pelvic floor assessment internally with pt consent. Pt motivated to participate in PT and agreeable to attempt recommendations.     PATIENT EDUCATION:  Education details: Pt was educated on relevant anatomy, exam findings, home exercise program, plan of care, expectations of PT and urge suppression techniques  Person educated: Patient Education method: Explanation, Demonstration, Tactile cues, Verbal cues, and Handouts Education comprehension: verbalized understanding, returned demonstration, verbal cues required, tactile cues required, and needs further education  HOME EXERCISE PROGRAM: Access Code: [email or web address] URL: [email or web address] Date: 11/10/2024 Prepared by: Cori Mohamadou Maciver  Patient Education - Urinary Urge Control Techniques - Urinary Urge Control Techniques - Lifestyle Changes to Support Urinary and Bladder Health - Lifestyle Changes that Support Urinary Health  ASSESSMENT:  CLINICAL IMPRESSION: Patient was seen today for treatment of urinary urgency and mixed  incontinence especially with coughing.  She had a little difficulty with coordination with new core exercises.  Urgency much better, still leaking with cough and sneeze, will continue to benefit from PT to reduce incontinence.       Patient was seen today for treatment of urinary urgency. Patient did well with placing TTNS, using for 15 mins and urge suppression strategies. She is seeing some improvement, was able to hold her urine on her bus trip. Did not have to get up at night. We discussed progress, urge suppression techniques for bladder retraining and recommended not just in case peeing as a habit and 2 week water only bladder reset after she comes from her trip. Patient has some bilateral hip weakness and will benefit from hip and pelvic floor strengthening after she learns vulvovaginal massage next. Patient is progressing slowly towards goals and will benefit from continued PT to address deficits, reduce urge incontinence and urgency and improve quality of life.        From eval Patient is a 69 y.o. F who was seen today for physical therapy evaluation and treatment for urge incontinence. Patient with signs of mixed incontinence. Patient reports that symptoms are affecting her life a lot. Patient reports being frustrated , always looking for a bathroom. Exam findings are notable for upper chest breathing strategies, abdominal diastasis with doming, pelvic floor muscle high tone,  tone in pelvic floor. External soft tissues of pelvic floor appear fine. Patient demonstrates decreased trunk mobility, bilateral hip weakness, trigger points in pelvic floor muscles, reduced range or motion in lumbar spine and tenderness with palpation in bilateral bulbocavernosus. It is difficult for patient to travel and be active due to urinary frequency and leaking . Discussed findings with patient, educated patient on urge suppression and HEP was initiated. Patient's quality of life has been affected, patient is  frustrated and will benefit from physical therapy to address deficits, reduce urinary incontinence and improve urinary frequency and quality of life.  .   OBJECTIVE IMPAIRMENTS: decreased coordination, decreased ROM, decreased strength, increased fascial restrictions, increased muscle spasms, impaired tone, and obesity.   ACTIVITY LIMITATIONS: continence and toileting  PARTICIPATION LIMITATIONS: driving, shopping, community activity, and  yard work  PERSONAL FACTORS: Time since onset of injury/illness/exacerbation are also affecting patient's functional outcome.   REHAB POTENTIAL: Good  CLINICAL DECISION MAKING: Evolving/moderate complexity  EVALUATION COMPLEXITY: Moderate   GOALS: Goals reviewed with patient? Yes  SHORT TERM GOALS: Target date: 12/08/2024    Pt will be independent with HEP.   Baseline: Goal status: INITIAL  2.  Patient will report 2-3 hours between voids Baseline: 30 mins Goal status: INITIAL  3.  Patient will teach back urge suppression techniques Baseline:  Goal status: ongoing   4.  Patient will be I with vulvovaginal massage to reduce pelvic floor tone Baseline:  Goal status: INITIAL   LONG TERM GOALS: Target date: 02/10/2025   Pt will be I and consistent with her HEP and demonstrate all exercises correctly  Baseline:  Goal status: INITIAL  2.  Pt will demonstrate 20 point improvement in PFIQ-7 score in order to show functional improvement in urinary incontinence.   Baseline: 67 Goal status: INITIAL  3.  Pt will soak 0 pads/ day in order to run errands and not have to interrupt daily tasks.  Baseline: 1 Goal status: INITIAL  4.  Patient will dem improved PROM/ AROM pelvic floor Baseline:  Goal status: INITIAL    PLAN:  PT FREQUENCY: 1-2x/week  PT DURATION: 12 weeks  PLANNED INTERVENTIONS: 97110-Therapeutic exercises, 97530- Therapeutic activity, 97112- Neuromuscular re-education, 97535- Self Care, [id number]- Manual therapy, [phone]  Electrical stimulation (manual), [phone]- Ionotophoresis 4mg /ml Dexamethasone, 79439 (1-2 muscles), [id number] (3+ muscles)- Dry Needling, Patient/Family education, Taping, Joint mobilization, Joint manipulation, Spinal manipulation, Spinal mobilization, Manual lymph drainage, Scar mobilization, Cryotherapy, Moist heat, and Biofeedback  PLAN FOR NEXT SESSION: diaphragmatic breathing, stretches for down training, TTNS, urge suppression, bladder diary, scar massage, 2 week only  bladder reset, core strenthening  Cristi Gwynn, PT 01/05/2025, 5:22 PM  "

## 2025-01-07 ENCOUNTER — Ambulatory Visit: Admitting: Physical Therapy

## 2025-02-04 ENCOUNTER — Ambulatory Visit: Payer: Self-pay | Admitting: Physical Therapy
